# Patient Record
Sex: Male | Born: 1935 | Race: White | Hispanic: No | Smoking: Former smoker
Health system: Southern US, Community
[De-identification: ages and names within clinical notes are randomized; demographics above are authoritative.]

## PROBLEM LIST (undated history)

## (undated) DIAGNOSIS — J449 Chronic obstructive pulmonary disease, unspecified: Secondary | ICD-10-CM

## (undated) DIAGNOSIS — E0590 Thyrotoxicosis, unspecified without thyrotoxic crisis or storm: Secondary | ICD-10-CM

## (undated) DIAGNOSIS — I482 Chronic atrial fibrillation, unspecified: Secondary | ICD-10-CM

## (undated) DIAGNOSIS — J939 Pneumothorax, unspecified: Secondary | ICD-10-CM

## (undated) DIAGNOSIS — Z7901 Long term (current) use of anticoagulants: Secondary | ICD-10-CM

## (undated) DIAGNOSIS — I2510 Atherosclerotic heart disease of native coronary artery without angina pectoris: Secondary | ICD-10-CM

## (undated) DIAGNOSIS — K219 Gastro-esophageal reflux disease without esophagitis: Secondary | ICD-10-CM

## (undated) DIAGNOSIS — C3490 Malignant neoplasm of unspecified part of unspecified bronchus or lung: Secondary | ICD-10-CM

## (undated) DIAGNOSIS — IMO0002 Reserved for concepts with insufficient information to code with codable children: Secondary | ICD-10-CM

## (undated) DIAGNOSIS — I712 Thoracic aortic aneurysm, without rupture: Secondary | ICD-10-CM

## (undated) DIAGNOSIS — Z87891 Personal history of nicotine dependence: Secondary | ICD-10-CM

## (undated) DIAGNOSIS — J9610 Chronic respiratory failure, unspecified whether with hypoxia or hypercapnia: Secondary | ICD-10-CM

## (undated) DIAGNOSIS — H3530 Unspecified macular degeneration: Secondary | ICD-10-CM

## (undated) DIAGNOSIS — Z9981 Dependence on supplemental oxygen: Secondary | ICD-10-CM

## (undated) DIAGNOSIS — N200 Calculus of kidney: Secondary | ICD-10-CM

## (undated) DIAGNOSIS — IMO0001 Reserved for inherently not codable concepts without codable children: Secondary | ICD-10-CM

## (undated) DIAGNOSIS — J700 Acute pulmonary manifestations due to radiation: Secondary | ICD-10-CM

## (undated) HISTORY — DX: Thoracic aortic aneurysm, without rupture: I71.2

## (undated) HISTORY — DX: Reserved for inherently not codable concepts without codable children: IMO0001

## (undated) HISTORY — PX: APPENDECTOMY: SHX54

## (undated) HISTORY — DX: Reserved for concepts with insufficient information to code with codable children: IMO0002

## (undated) HISTORY — DX: Chronic obstructive pulmonary disease, unspecified: J44.9

## (undated) HISTORY — PX: CHOLECYSTECTOMY: SHX55

## (undated) HISTORY — DX: Atherosclerotic heart disease of native coronary artery without angina pectoris: I25.10

## (undated) HISTORY — DX: Chronic atrial fibrillation, unspecified: I48.20

## (undated) HISTORY — DX: Gastro-esophageal reflux disease without esophagitis: K21.9

## (undated) HISTORY — DX: Malignant neoplasm of unspecified part of unspecified bronchus or lung: C34.90

---

## 2001-03-26 ENCOUNTER — Emergency Department (HOSPITAL_COMMUNITY): Admission: EM | Admit: 2001-03-26 | Discharge: 2001-03-26 | Payer: Self-pay | Admitting: Emergency Medicine

## 2003-04-18 ENCOUNTER — Encounter: Admission: RE | Admit: 2003-04-18 | Discharge: 2003-04-18 | Payer: Self-pay | Admitting: Urology

## 2003-04-18 ENCOUNTER — Encounter: Payer: Self-pay | Admitting: Urology

## 2003-04-19 ENCOUNTER — Encounter: Payer: Self-pay | Admitting: Urology

## 2003-04-19 ENCOUNTER — Ambulatory Visit (HOSPITAL_BASED_OUTPATIENT_CLINIC_OR_DEPARTMENT_OTHER): Admission: RE | Admit: 2003-04-19 | Discharge: 2003-04-19 | Payer: Self-pay | Admitting: Urology

## 2003-04-20 ENCOUNTER — Ambulatory Visit (HOSPITAL_BASED_OUTPATIENT_CLINIC_OR_DEPARTMENT_OTHER): Admission: RE | Admit: 2003-04-20 | Discharge: 2003-04-20 | Payer: Self-pay | Admitting: Urology

## 2003-08-17 ENCOUNTER — Ambulatory Visit (HOSPITAL_COMMUNITY): Admission: RE | Admit: 2003-08-17 | Discharge: 2003-08-17 | Payer: Self-pay | Admitting: Urology

## 2004-07-07 ENCOUNTER — Emergency Department (HOSPITAL_COMMUNITY): Admission: EM | Admit: 2004-07-07 | Discharge: 2004-07-08 | Payer: Self-pay | Admitting: Emergency Medicine

## 2004-07-11 ENCOUNTER — Ambulatory Visit (HOSPITAL_COMMUNITY): Admission: RE | Admit: 2004-07-11 | Discharge: 2004-07-11 | Payer: Self-pay | Admitting: Family Medicine

## 2004-07-16 ENCOUNTER — Ambulatory Visit (HOSPITAL_COMMUNITY): Admission: RE | Admit: 2004-07-16 | Discharge: 2004-07-16 | Payer: Self-pay | Admitting: Family Medicine

## 2004-08-05 ENCOUNTER — Ambulatory Visit (HOSPITAL_COMMUNITY): Admission: RE | Admit: 2004-08-05 | Discharge: 2004-08-06 | Payer: Self-pay | Admitting: General Surgery

## 2004-08-05 ENCOUNTER — Encounter (INDEPENDENT_AMBULATORY_CARE_PROVIDER_SITE_OTHER): Payer: Self-pay | Admitting: Specialist

## 2005-12-10 ENCOUNTER — Ambulatory Visit (HOSPITAL_COMMUNITY): Admission: RE | Admit: 2005-12-10 | Discharge: 2005-12-10 | Payer: Self-pay | Admitting: Urology

## 2005-12-12 ENCOUNTER — Ambulatory Visit: Admission: RE | Admit: 2005-12-12 | Discharge: 2006-03-12 | Payer: Self-pay | Admitting: Radiation Oncology

## 2006-03-13 ENCOUNTER — Ambulatory Visit: Admission: RE | Admit: 2006-03-13 | Discharge: 2006-04-21 | Payer: Self-pay | Admitting: Radiation Oncology

## 2007-08-14 ENCOUNTER — Emergency Department (HOSPITAL_COMMUNITY): Admission: EM | Admit: 2007-08-14 | Discharge: 2007-08-15 | Payer: Self-pay | Admitting: Emergency Medicine

## 2007-09-25 ENCOUNTER — Emergency Department (HOSPITAL_COMMUNITY): Admission: EM | Admit: 2007-09-25 | Discharge: 2007-09-25 | Payer: Self-pay | Admitting: Internal Medicine

## 2007-10-05 ENCOUNTER — Emergency Department (HOSPITAL_COMMUNITY): Admission: EM | Admit: 2007-10-05 | Discharge: 2007-10-05 | Payer: Self-pay | Admitting: *Deleted

## 2008-09-03 ENCOUNTER — Inpatient Hospital Stay (HOSPITAL_COMMUNITY): Admission: EM | Admit: 2008-09-03 | Discharge: 2008-09-08 | Payer: Self-pay | Admitting: Emergency Medicine

## 2008-09-04 ENCOUNTER — Encounter (INDEPENDENT_AMBULATORY_CARE_PROVIDER_SITE_OTHER): Payer: Self-pay | Admitting: Internal Medicine

## 2008-10-19 ENCOUNTER — Ambulatory Visit (HOSPITAL_COMMUNITY): Admission: RE | Admit: 2008-10-19 | Discharge: 2008-10-19 | Payer: Self-pay | Admitting: *Deleted

## 2008-12-11 ENCOUNTER — Emergency Department (HOSPITAL_COMMUNITY): Admission: EM | Admit: 2008-12-11 | Discharge: 2008-12-11 | Payer: Self-pay | Admitting: Emergency Medicine

## 2009-02-27 ENCOUNTER — Emergency Department (HOSPITAL_COMMUNITY): Admission: EM | Admit: 2009-02-27 | Discharge: 2009-02-27 | Payer: Self-pay | Admitting: Emergency Medicine

## 2009-03-05 ENCOUNTER — Ambulatory Visit: Payer: Self-pay | Admitting: *Deleted

## 2009-03-05 ENCOUNTER — Inpatient Hospital Stay (HOSPITAL_COMMUNITY): Admission: EM | Admit: 2009-03-05 | Discharge: 2009-03-07 | Payer: Self-pay | Admitting: Emergency Medicine

## 2009-03-09 ENCOUNTER — Emergency Department (HOSPITAL_COMMUNITY): Admission: EM | Admit: 2009-03-09 | Discharge: 2009-03-09 | Payer: Self-pay | Admitting: Family Medicine

## 2009-09-27 ENCOUNTER — Inpatient Hospital Stay (HOSPITAL_COMMUNITY): Admission: EM | Admit: 2009-09-27 | Discharge: 2009-10-01 | Payer: Self-pay | Admitting: Emergency Medicine

## 2010-02-13 ENCOUNTER — Inpatient Hospital Stay (HOSPITAL_COMMUNITY): Admission: EM | Admit: 2010-02-13 | Discharge: 2010-02-16 | Payer: Self-pay | Admitting: Emergency Medicine

## 2010-07-23 ENCOUNTER — Emergency Department (HOSPITAL_COMMUNITY)
Admission: EM | Admit: 2010-07-23 | Discharge: 2010-07-23 | Payer: Self-pay | Source: Home / Self Care | Admitting: Emergency Medicine

## 2010-10-06 HISTORY — PX: ESOPHAGEAL DILATION: SHX303

## 2010-10-16 ENCOUNTER — Ambulatory Visit (HOSPITAL_COMMUNITY)
Admission: RE | Admit: 2010-10-16 | Discharge: 2010-10-16 | Payer: Self-pay | Source: Home / Self Care | Attending: Gastroenterology | Admitting: Gastroenterology

## 2010-12-18 LAB — POCT I-STAT, CHEM 8
BUN: 14 mg/dL (ref 6–23)
Calcium, Ion: 1.2 mmol/L (ref 1.12–1.32)
Chloride: 105 mEq/L (ref 96–112)
Creatinine, Ser: 1.1 mg/dL (ref 0.4–1.5)
Glucose, Bld: 105 mg/dL — ABNORMAL HIGH (ref 70–99)
HCT: 50 % (ref 39.0–52.0)
Hemoglobin: 17 g/dL (ref 13.0–17.0)
Potassium: 3.7 mEq/L (ref 3.5–5.1)
Sodium: 140 mEq/L (ref 135–145)
TCO2: 27 mmol/L (ref 0–100)

## 2010-12-18 LAB — PROTIME-INR
INR: 1.88 — ABNORMAL HIGH (ref 0.00–1.49)
Prothrombin Time: 21.8 seconds — ABNORMAL HIGH (ref 11.6–15.2)

## 2010-12-22 ENCOUNTER — Emergency Department (HOSPITAL_COMMUNITY)
Admission: EM | Admit: 2010-12-22 | Discharge: 2010-12-22 | Disposition: A | Discharge status: Home / Self Care | Payer: Medicare Other | Attending: Emergency Medicine | Admitting: Emergency Medicine

## 2010-12-22 ENCOUNTER — Emergency Department (HOSPITAL_COMMUNITY): Payer: Medicare Other

## 2010-12-22 DIAGNOSIS — I10 Essential (primary) hypertension: Secondary | ICD-10-CM | POA: Insufficient documentation

## 2010-12-22 DIAGNOSIS — R05 Cough: Secondary | ICD-10-CM | POA: Insufficient documentation

## 2010-12-22 DIAGNOSIS — R0602 Shortness of breath: Secondary | ICD-10-CM | POA: Insufficient documentation

## 2010-12-22 DIAGNOSIS — K219 Gastro-esophageal reflux disease without esophagitis: Secondary | ICD-10-CM | POA: Insufficient documentation

## 2010-12-22 DIAGNOSIS — J4489 Other specified chronic obstructive pulmonary disease: Secondary | ICD-10-CM | POA: Insufficient documentation

## 2010-12-22 DIAGNOSIS — J449 Chronic obstructive pulmonary disease, unspecified: Secondary | ICD-10-CM | POA: Insufficient documentation

## 2010-12-22 DIAGNOSIS — R5383 Other fatigue: Secondary | ICD-10-CM | POA: Insufficient documentation

## 2010-12-22 DIAGNOSIS — I509 Heart failure, unspecified: Secondary | ICD-10-CM | POA: Insufficient documentation

## 2010-12-22 DIAGNOSIS — R059 Cough, unspecified: Secondary | ICD-10-CM | POA: Insufficient documentation

## 2010-12-22 DIAGNOSIS — I2510 Atherosclerotic heart disease of native coronary artery without angina pectoris: Secondary | ICD-10-CM | POA: Insufficient documentation

## 2010-12-22 DIAGNOSIS — I4891 Unspecified atrial fibrillation: Secondary | ICD-10-CM | POA: Insufficient documentation

## 2010-12-22 DIAGNOSIS — R5381 Other malaise: Secondary | ICD-10-CM | POA: Insufficient documentation

## 2010-12-22 LAB — CBC
HCT: 42.9 % (ref 39.0–52.0)
Hemoglobin: 14.2 g/dL (ref 13.0–17.0)
MCH: 31.8 pg (ref 26.0–34.0)
MCHC: 33.1 g/dL (ref 30.0–36.0)
MCV: 96.2 fL (ref 78.0–100.0)
Platelets: 111 10*3/uL — ABNORMAL LOW (ref 150–400)
RBC: 4.46 MIL/uL (ref 4.22–5.81)
RDW: 13.2 % (ref 11.5–15.5)
WBC: 15.1 10*3/uL — ABNORMAL HIGH (ref 4.0–10.5)

## 2010-12-22 LAB — DIFFERENTIAL
Basophils Absolute: 0 10*3/uL (ref 0.0–0.1)
Basophils Relative: 0 % (ref 0–1)
Eosinophils Absolute: 0 10*3/uL (ref 0.0–0.7)
Eosinophils Relative: 0 % (ref 0–5)
Lymphocytes Relative: 6 % — ABNORMAL LOW (ref 12–46)
Lymphs Abs: 0.9 10*3/uL (ref 0.7–4.0)
Monocytes Absolute: 1.1 10*3/uL — ABNORMAL HIGH (ref 0.1–1.0)
Monocytes Relative: 7 % (ref 3–12)
Neutro Abs: 13.1 10*3/uL — ABNORMAL HIGH (ref 1.7–7.7)
Neutrophils Relative %: 87 % — ABNORMAL HIGH (ref 43–77)

## 2010-12-22 LAB — COMPREHENSIVE METABOLIC PANEL
ALT: 39 U/L (ref 0–53)
AST: 38 U/L — ABNORMAL HIGH (ref 0–37)
Albumin: 3.6 g/dL (ref 3.5–5.2)
Alkaline Phosphatase: 72 U/L (ref 39–117)
BUN: 13 mg/dL (ref 6–23)
CO2: 27 mEq/L (ref 19–32)
Calcium: 9 mg/dL (ref 8.4–10.5)
Chloride: 109 mEq/L (ref 96–112)
Creatinine, Ser: 1.2 mg/dL (ref 0.4–1.5)
GFR calc Af Amer: >60 mL/min (ref >60)
GFR calc non Af Amer: 59 mL/min — ABNORMAL LOW (ref >60)
Glucose, Bld: 104 mg/dL — ABNORMAL HIGH (ref 70–99)
Potassium: 4.3 mEq/L (ref 3.5–5.1)
Sodium: 141 mEq/L (ref 135–145)
Total Bilirubin: 1 mg/dL (ref 0.3–1.2)
Total Protein: 7.4 g/dL (ref 6.0–8.3)

## 2010-12-22 LAB — BRAIN NATRIURETIC PEPTIDE: Pro B Natriuretic peptide (BNP): 153 pg/mL — ABNORMAL HIGH (ref 0.0–100.0)

## 2010-12-23 ENCOUNTER — Ambulatory Visit
Admission: RE | Admit: 2010-12-23 | Discharge: 2010-12-23 | Disposition: A | Discharge status: Home / Self Care | Payer: Medicare Other | Source: Ambulatory Visit | Attending: Cardiology | Admitting: Cardiology

## 2010-12-23 ENCOUNTER — Other Ambulatory Visit: Payer: Self-pay | Admitting: Cardiology

## 2010-12-23 DIAGNOSIS — R0989 Other specified symptoms and signs involving the circulatory and respiratory systems: Secondary | ICD-10-CM

## 2010-12-23 DIAGNOSIS — R0609 Other forms of dyspnea: Secondary | ICD-10-CM

## 2010-12-24 LAB — CULTURE, BLOOD (ROUTINE X 2)

## 2010-12-24 LAB — DIFFERENTIAL
Basophils Absolute: 0 10*3/uL (ref 0.0–0.1)
Basophils Absolute: 0 10*3/uL (ref 0.0–0.1)
Basophils Relative: 0 % (ref 0–1)
Basophils Relative: 0 % (ref 0–1)
Eosinophils Absolute: 0 10*3/uL (ref 0.0–0.7)
Eosinophils Relative: 0 % (ref 0–5)
Lymphocytes Relative: 4 % — ABNORMAL LOW (ref 12–46)
Lymphocytes Relative: 5 % — ABNORMAL LOW (ref 12–46)
Lymphs Abs: 0.7 10*3/uL (ref 0.7–4.0)
Monocytes Absolute: 0.3 10*3/uL (ref 0.1–1.0)
Monocytes Absolute: 0.7 10*3/uL (ref 0.1–1.0)
Monocytes Relative: 3 % (ref 3–12)
Neutro Abs: 10.3 10*3/uL — ABNORMAL HIGH (ref 1.7–7.7)
Neutro Abs: 12.1 10*3/uL — ABNORMAL HIGH (ref 1.7–7.7)
Neutro Abs: 12.8 10*3/uL — ABNORMAL HIGH (ref 1.7–7.7)
Neutrophils Relative %: 93 % — ABNORMAL HIGH (ref 43–77)
WBC Morphology: INCREASED

## 2010-12-24 LAB — URINALYSIS, ROUTINE W REFLEX MICROSCOPIC
Ketones, ur: NEGATIVE mg/dL
Leukocytes, UA: NEGATIVE
Nitrite: NEGATIVE
pH: 6.5 (ref 5.0–8.0)

## 2010-12-24 LAB — PROTIME-INR
INR: 1.63 — ABNORMAL HIGH (ref 0.00–1.49)
INR: 1.94 — ABNORMAL HIGH (ref 0.00–1.49)
INR: 2.25 — ABNORMAL HIGH (ref 0.00–1.49)
INR: 2.29 — ABNORMAL HIGH (ref 0.00–1.49)
Prothrombin Time: 25 seconds — ABNORMAL HIGH (ref 11.6–15.2)

## 2010-12-24 LAB — LEGIONELLA ANTIGEN, URINE: Legionella Antigen, Urine: NEGATIVE

## 2010-12-24 LAB — BASIC METABOLIC PANEL
BUN: 12 mg/dL (ref 6–23)
BUN: 27 mg/dL — ABNORMAL HIGH (ref 6–23)
CO2: 30 mEq/L (ref 19–32)
Calcium: 9.1 mg/dL (ref 8.4–10.5)
Chloride: 97 mEq/L (ref 96–112)
Creatinine, Ser: 1.12 mg/dL (ref 0.4–1.5)
Creatinine, Ser: 1.31 mg/dL (ref 0.4–1.5)
Creatinine, Ser: 1.34 mg/dL (ref 0.4–1.5)
GFR calc Af Amer: >60 mL/min (ref >60)
GFR calc Af Amer: >60 mL/min (ref >60)
GFR calc non Af Amer: 53 mL/min — ABNORMAL LOW (ref >60)
GFR calc non Af Amer: >60 mL/min (ref >60)
Glucose, Bld: 108 mg/dL — ABNORMAL HIGH (ref 70–99)
Potassium: 3.3 mEq/L — ABNORMAL LOW (ref 3.5–5.1)

## 2010-12-24 LAB — LIPID PANEL
Cholesterol: 161 mg/dL (ref 0–200)
Total CHOL/HDL Ratio: 1.7 RATIO
VLDL: 10 mg/dL (ref 0–40)

## 2010-12-24 LAB — BRAIN NATRIURETIC PEPTIDE
Pro B Natriuretic peptide (BNP): 278 pg/mL — ABNORMAL HIGH (ref 0.0–100.0)
Pro B Natriuretic peptide (BNP): 282 pg/mL — ABNORMAL HIGH (ref 0.0–100.0)

## 2010-12-24 LAB — CBC
HCT: 45.2 % (ref 39.0–52.0)
HCT: 46.4 % (ref 39.0–52.0)
Hemoglobin: 15.3 g/dL (ref 13.0–17.0)
MCHC: 33.8 g/dL (ref 30.0–36.0)
MCHC: 34.3 g/dL (ref 30.0–36.0)
MCV: 97.9 fL (ref 78.0–100.0)
MCV: 99.2 fL (ref 78.0–100.0)
Platelets: 102 10*3/uL — ABNORMAL LOW (ref 150–400)
Platelets: 92 10*3/uL — ABNORMAL LOW (ref 150–400)
Platelets: 96 10*3/uL — ABNORMAL LOW (ref 150–400)
RBC: 4.23 MIL/uL (ref 4.22–5.81)
RBC: 4.56 MIL/uL (ref 4.22–5.81)
RBC: 4.6 MIL/uL (ref 4.22–5.81)
RDW: 14 % (ref 11.5–15.5)
WBC: 11.1 10*3/uL — ABNORMAL HIGH (ref 4.0–10.5)
WBC: 12.8 10*3/uL — ABNORMAL HIGH (ref 4.0–10.5)
WBC: 12.9 10*3/uL — ABNORMAL HIGH (ref 4.0–10.5)

## 2010-12-24 LAB — HEMOGLOBIN A1C: Hgb A1c MFr Bld: 5.3 % (ref <5.7)

## 2010-12-24 LAB — URINE CULTURE

## 2010-12-24 LAB — STREP PNEUMONIAE URINARY ANTIGEN: Strep Pneumo Urinary Antigen: NEGATIVE

## 2010-12-24 LAB — LACTATE DEHYDROGENASE: LDH: 192 U/L (ref 94–250)

## 2010-12-24 LAB — TSH: TSH: 0.318 u[IU]/mL — ABNORMAL LOW (ref 0.350–4.500)

## 2010-12-24 LAB — CARDIAC PANEL(CRET KIN+CKTOT+MB+TROPI)
Relative Index: INVALID (ref 0.0–2.5)
Total CK: 96 U/L (ref 7–232)

## 2010-12-24 LAB — CK TOTAL AND CKMB (NOT AT ARMC): Relative Index: 2 (ref 0.0–2.5)

## 2010-12-24 LAB — COMPREHENSIVE METABOLIC PANEL
Albumin: 3.1 g/dL — ABNORMAL LOW (ref 3.5–5.2)
Alkaline Phosphatase: 68 U/L (ref 39–117)
BUN: 15 mg/dL (ref 6–23)
CO2: 30 mEq/L (ref 19–32)
Chloride: 100 mEq/L (ref 96–112)
Creatinine, Ser: 1.15 mg/dL (ref 0.4–1.5)
GFR calc non Af Amer: >60 mL/min (ref >60)
Potassium: 3.5 mEq/L (ref 3.5–5.1)
Total Bilirubin: 0.8 mg/dL (ref 0.3–1.2)

## 2010-12-24 LAB — APTT: aPTT: 32 seconds (ref 24–37)

## 2010-12-24 LAB — URINE MICROSCOPIC-ADD ON

## 2010-12-24 LAB — TROPONIN I: Troponin I: 0.02 ng/mL (ref 0.00–0.06)

## 2010-12-24 LAB — DIGOXIN LEVEL: Digoxin Level: 0.5 ng/mL — ABNORMAL LOW (ref 0.8–2.0)

## 2010-12-24 LAB — TECHNOLOGIST SMEAR REVIEW

## 2011-01-06 LAB — BASIC METABOLIC PANEL
BUN: 34 mg/dL — ABNORMAL HIGH (ref 6–23)
BUN: 34 mg/dL — ABNORMAL HIGH (ref 6–23)
CO2: 29 mEq/L (ref 19–32)
CO2: 32 mEq/L (ref 19–32)
Calcium: 8.6 mg/dL (ref 8.4–10.5)
Calcium: 9.1 mg/dL (ref 8.4–10.5)
Calcium: 9.4 mg/dL (ref 8.4–10.5)
Chloride: 98 mEq/L (ref 96–112)
Chloride: 99 mEq/L (ref 96–112)
Creatinine, Ser: 0.97 mg/dL (ref 0.4–1.5)
Creatinine, Ser: 1.46 mg/dL (ref 0.4–1.5)
GFR calc Af Amer: 57 mL/min — ABNORMAL LOW (ref >60)
GFR calc Af Amer: >60 mL/min (ref >60)
GFR calc Af Amer: >60 mL/min (ref >60)
GFR calc non Af Amer: 46 mL/min — ABNORMAL LOW (ref >60)
GFR calc non Af Amer: >60 mL/min (ref >60)
GFR calc non Af Amer: >60 mL/min (ref >60)
Glucose, Bld: 133 mg/dL — ABNORMAL HIGH (ref 70–99)
Glucose, Bld: 143 mg/dL — ABNORMAL HIGH (ref 70–99)
Potassium: 4.2 mEq/L (ref 3.5–5.1)
Sodium: 136 mEq/L (ref 135–145)
Sodium: 139 mEq/L (ref 135–145)

## 2011-01-06 LAB — GLUCOSE, CAPILLARY
Glucose-Capillary: 110 mg/dL — ABNORMAL HIGH (ref 70–99)
Glucose-Capillary: 115 mg/dL — ABNORMAL HIGH (ref 70–99)
Glucose-Capillary: 118 mg/dL — ABNORMAL HIGH (ref 70–99)
Glucose-Capillary: 121 mg/dL — ABNORMAL HIGH (ref 70–99)
Glucose-Capillary: 122 mg/dL — ABNORMAL HIGH (ref 70–99)
Glucose-Capillary: 126 mg/dL — ABNORMAL HIGH (ref 70–99)
Glucose-Capillary: 126 mg/dL — ABNORMAL HIGH (ref 70–99)
Glucose-Capillary: 129 mg/dL — ABNORMAL HIGH (ref 70–99)
Glucose-Capillary: 136 mg/dL — ABNORMAL HIGH (ref 70–99)
Glucose-Capillary: 139 mg/dL — ABNORMAL HIGH (ref 70–99)
Glucose-Capillary: 139 mg/dL — ABNORMAL HIGH (ref 70–99)
Glucose-Capillary: 147 mg/dL — ABNORMAL HIGH (ref 70–99)
Glucose-Capillary: 151 mg/dL — ABNORMAL HIGH (ref 70–99)
Glucose-Capillary: 176 mg/dL — ABNORMAL HIGH (ref 70–99)

## 2011-01-06 LAB — CBC
HCT: 44.3 % (ref 39.0–52.0)
HCT: 46 % (ref 39.0–52.0)
Hemoglobin: 14.8 g/dL (ref 13.0–17.0)
Hemoglobin: 15.4 g/dL (ref 13.0–17.0)
MCHC: 33.2 g/dL (ref 30.0–36.0)
MCHC: 33.5 g/dL (ref 30.0–36.0)
MCV: 97.1 fL (ref 78.0–100.0)
MCV: 97.8 fL (ref 78.0–100.0)
Platelets: 86 10*3/uL — ABNORMAL LOW (ref 150–400)
RBC: 4.53 MIL/uL (ref 4.22–5.81)
RBC: 4.63 MIL/uL (ref 4.22–5.81)
RBC: 4.74 MIL/uL (ref 4.22–5.81)
RBC: 5.43 MIL/uL (ref 4.22–5.81)
RDW: 14.7 % (ref 11.5–15.5)
RDW: 14.8 % (ref 11.5–15.5)
WBC: 10.1 10*3/uL (ref 4.0–10.5)
WBC: 21.3 10*3/uL — ABNORMAL HIGH (ref 4.0–10.5)

## 2011-01-06 LAB — CULTURE, BLOOD (ROUTINE X 2)
Culture: NO GROWTH
Culture: NO GROWTH

## 2011-01-06 LAB — EXPECTORATED SPUTUM ASSESSMENT W GRAM STAIN, RFLX TO RESP C

## 2011-01-06 LAB — TSH: TSH: 0.302 u[IU]/mL — ABNORMAL LOW (ref 0.350–4.500)

## 2011-01-06 LAB — CARDIAC PANEL(CRET KIN+CKTOT+MB+TROPI)
CK, MB: 5 ng/mL — ABNORMAL HIGH (ref 0.3–4.0)
CK, MB: 7.1 ng/mL — ABNORMAL HIGH (ref 0.3–4.0)
Relative Index: 6.9 — ABNORMAL HIGH (ref 0.0–2.5)
Total CK: 73 U/L (ref 7–232)

## 2011-01-06 LAB — PROTIME-INR
INR: 1.98 — ABNORMAL HIGH (ref 0.00–1.49)
INR: 2.03 — ABNORMAL HIGH (ref 0.00–1.49)
Prothrombin Time: 22.3 seconds — ABNORMAL HIGH (ref 11.6–15.2)
Prothrombin Time: 22.8 seconds — ABNORMAL HIGH (ref 11.6–15.2)

## 2011-01-06 LAB — POCT CARDIAC MARKERS
CKMB, poc: 1.3 ng/mL (ref 1.0–8.0)
Myoglobin, poc: 146 ng/mL (ref 12–200)
Troponin i, poc: <0.05 ng/mL (ref 0.00–0.09)

## 2011-01-06 LAB — DIFFERENTIAL
Lymphs Abs: 0.5 10*3/uL — ABNORMAL LOW (ref 0.7–4.0)
Monocytes Relative: 5 % (ref 3–12)
Neutro Abs: 19.6 10*3/uL — ABNORMAL HIGH (ref 1.7–7.7)
Neutrophils Relative %: 92 % — ABNORMAL HIGH (ref 43–77)

## 2011-01-06 LAB — CK TOTAL AND CKMB (NOT AT ARMC)
CK, MB: 5.9 ng/mL — ABNORMAL HIGH (ref 0.3–4.0)
Relative Index: INVALID (ref 0.0–2.5)
Total CK: 74 U/L (ref 7–232)

## 2011-01-06 LAB — LEGIONELLA ANTIGEN, URINE: Legionella Antigen, Urine: NEGATIVE

## 2011-01-06 LAB — LACTIC ACID, PLASMA: Lactic Acid, Venous: 3.4 mmol/L — ABNORMAL HIGH (ref 0.5–2.2)

## 2011-01-06 LAB — HEMOGLOBIN A1C: Mean Plasma Glucose: 120 mg/dL

## 2011-01-06 LAB — DIGOXIN LEVEL: Digoxin Level: 1 ng/mL (ref 0.8–2.0)

## 2011-01-06 LAB — TROPONIN I: Troponin I: 0.02 ng/mL (ref 0.00–0.06)

## 2011-01-13 LAB — PROTIME-INR
INR: 2.1 — ABNORMAL HIGH (ref 0.00–1.49)
INR: 2.9 — ABNORMAL HIGH (ref 0.00–1.49)
Prothrombin Time: 25 seconds — ABNORMAL HIGH (ref 11.6–15.2)

## 2011-01-13 LAB — CBC
Hemoglobin: 14.8 g/dL (ref 13.0–17.0)
Hemoglobin: 14.9 g/dL (ref 13.0–17.0)
MCHC: 32.8 g/dL (ref 30.0–36.0)
MCV: 97.5 fL (ref 78.0–100.0)
RBC: 4.57 MIL/uL (ref 4.22–5.81)
RBC: 4.65 MIL/uL (ref 4.22–5.81)
RDW: 14.2 % (ref 11.5–15.5)
RDW: 14.2 % (ref 11.5–15.5)
WBC: 4.7 10*3/uL (ref 4.0–10.5)

## 2011-01-13 LAB — LIPID PANEL
LDL Cholesterol: 84 mg/dL (ref 0–99)
Triglycerides: 83 mg/dL (ref <150)

## 2011-01-13 LAB — BASIC METABOLIC PANEL
BUN: 10 mg/dL (ref 6–23)
CO2: 32 mEq/L (ref 19–32)
CO2: 32 mEq/L (ref 19–32)
Glucose, Bld: 101 mg/dL — ABNORMAL HIGH (ref 70–99)
Glucose, Bld: 98 mg/dL (ref 70–99)
Potassium: 3.3 mEq/L — ABNORMAL LOW (ref 3.5–5.1)
Potassium: 3.9 mEq/L (ref 3.5–5.1)
Sodium: 141 mEq/L (ref 135–145)
Sodium: 143 mEq/L (ref 135–145)

## 2011-01-13 LAB — CARDIAC PANEL(CRET KIN+CKTOT+MB+TROPI)
CK, MB: 3.1 ng/mL (ref 0.3–4.0)
CK, MB: 4.2 ng/mL — ABNORMAL HIGH (ref 0.3–4.0)
Total CK: 141 U/L (ref 7–232)
Total CK: 95 U/L (ref 7–232)

## 2011-01-13 LAB — APTT: aPTT: 43 seconds — ABNORMAL HIGH (ref 24–37)

## 2011-01-14 LAB — PROTIME-INR: Prothrombin Time: 28 seconds — ABNORMAL HIGH (ref 11.6–15.2)

## 2011-01-14 LAB — CBC
HCT: 47.4 % (ref 39.0–52.0)
HCT: 49.6 % (ref 39.0–52.0)
Hemoglobin: 16.7 g/dL (ref 13.0–17.0)
MCHC: 33.2 g/dL (ref 30.0–36.0)
MCHC: 33.8 g/dL (ref 30.0–36.0)
MCV: 96.3 fL (ref 78.0–100.0)
Platelets: 127 10*3/uL — ABNORMAL LOW (ref 150–400)
Platelets: 132 10*3/uL — ABNORMAL LOW (ref 150–400)
RDW: 14 % (ref 11.5–15.5)
RDW: 14.2 % (ref 11.5–15.5)

## 2011-01-14 LAB — POCT CARDIAC MARKERS: Troponin i, poc: <0.05 ng/mL (ref 0.00–0.09)

## 2011-01-14 LAB — POCT I-STAT, CHEM 8
Calcium, Ion: 1.15 mmol/L (ref 1.12–1.32)
Chloride: 102 mEq/L (ref 96–112)
Glucose, Bld: 88 mg/dL (ref 70–99)
HCT: 52 % (ref 39.0–52.0)
TCO2: 26 mmol/L (ref 0–100)

## 2011-01-14 LAB — DIFFERENTIAL
Basophils Absolute: 0 10*3/uL (ref 0.0–0.1)
Basophils Relative: 1 % (ref 0–1)
Eosinophils Absolute: 0.1 10*3/uL (ref 0.0–0.7)
Eosinophils Relative: 2 % (ref 0–5)
Neutrophils Relative %: 66 % (ref 43–77)

## 2011-01-14 LAB — CK TOTAL AND CKMB (NOT AT ARMC): CK, MB: 3.2 ng/mL (ref 0.3–4.0)

## 2011-01-16 LAB — BASIC METABOLIC PANEL
CO2: 25 mEq/L (ref 19–32)
Chloride: 103 mEq/L (ref 96–112)
Creatinine, Ser: 1.1 mg/dL (ref 0.4–1.5)
GFR calc Af Amer: >60 mL/min (ref >60)
Potassium: 3.7 mEq/L (ref 3.5–5.1)

## 2011-01-16 LAB — CBC
HCT: 46.3 % (ref 39.0–52.0)
MCHC: 34 g/dL (ref 30.0–36.0)
MCV: 101.9 fL — ABNORMAL HIGH (ref 78.0–100.0)
RBC: 4.55 MIL/uL (ref 4.22–5.81)

## 2011-01-16 LAB — DIFFERENTIAL
Basophils Relative: 0 % (ref 0–1)
Eosinophils Absolute: 0 10*3/uL (ref 0.0–0.7)
Eosinophils Relative: 0 % (ref 0–5)
Lymphs Abs: 0.6 10*3/uL — ABNORMAL LOW (ref 0.7–4.0)
Monocytes Absolute: 0.8 10*3/uL (ref 0.1–1.0)
Neutro Abs: 10.1 10*3/uL — ABNORMAL HIGH (ref 1.7–7.7)
Neutrophils Relative %: 88 % — ABNORMAL HIGH (ref 43–77)

## 2011-01-16 LAB — PROTIME-INR: Prothrombin Time: 24.6 seconds — ABNORMAL HIGH (ref 11.6–15.2)

## 2011-01-21 ENCOUNTER — Emergency Department (HOSPITAL_COMMUNITY): Payer: Medicare Other

## 2011-01-21 ENCOUNTER — Inpatient Hospital Stay (HOSPITAL_COMMUNITY)
Admission: EM | Admit: 2011-01-21 | Discharge: 2011-01-23 | DRG: 190 | Disposition: A | Discharge status: Home / Self Care | Payer: Medicare Other | Attending: Internal Medicine | Admitting: Internal Medicine

## 2011-01-21 DIAGNOSIS — I4891 Unspecified atrial fibrillation: Secondary | ICD-10-CM | POA: Diagnosis present

## 2011-01-21 DIAGNOSIS — J441 Chronic obstructive pulmonary disease with (acute) exacerbation: Principal | ICD-10-CM | POA: Diagnosis present

## 2011-01-21 DIAGNOSIS — J189 Pneumonia, unspecified organism: Secondary | ICD-10-CM | POA: Diagnosis present

## 2011-01-21 DIAGNOSIS — I10 Essential (primary) hypertension: Secondary | ICD-10-CM | POA: Diagnosis present

## 2011-01-21 DIAGNOSIS — D696 Thrombocytopenia, unspecified: Secondary | ICD-10-CM | POA: Diagnosis present

## 2011-01-21 DIAGNOSIS — Z7901 Long term (current) use of anticoagulants: Secondary | ICD-10-CM

## 2011-01-21 LAB — BASIC METABOLIC PANEL
CO2: 24 mEq/L (ref 19–32)
Calcium: 9 mg/dL (ref 8.4–10.5)
GFR calc Af Amer: >60 mL/min (ref >60)
Glucose, Bld: 87 mg/dL (ref 70–99)
Potassium: 4.2 mEq/L (ref 3.5–5.1)
Sodium: 137 mEq/L (ref 135–145)

## 2011-01-21 LAB — DIFFERENTIAL
Eosinophils Relative: 1 % (ref 0–5)
Lymphocytes Relative: 7 % — ABNORMAL LOW (ref 12–46)
Lymphs Abs: 1.4 10*3/uL (ref 0.7–4.0)
Monocytes Absolute: 1.2 10*3/uL — ABNORMAL HIGH (ref 0.1–1.0)
Monocytes Relative: 7 % (ref 3–12)
Neutro Abs: 15.7 10*3/uL — ABNORMAL HIGH (ref 1.7–7.7)

## 2011-01-21 LAB — CBC
HCT: 44 % (ref 39.0–52.0)
Hemoglobin: 14.8 g/dL (ref 13.0–17.0)
MCH: 31.8 pg (ref 26.0–34.0)
MCHC: 33.6 g/dL (ref 30.0–36.0)
MCV: 94.6 fL (ref 78.0–100.0)
RDW: 13.4 % (ref 11.5–15.5)

## 2011-01-21 LAB — PROTIME-INR
INR: 2.25 — ABNORMAL HIGH (ref 0.00–1.49)
Prothrombin Time: 25 seconds — ABNORMAL HIGH (ref 11.6–15.2)

## 2011-01-21 LAB — POCT CARDIAC MARKERS: Myoglobin, poc: 102 ng/mL (ref 12–200)

## 2011-01-21 LAB — APTT: aPTT: 41 seconds — ABNORMAL HIGH (ref 24–37)

## 2011-01-22 LAB — DIFFERENTIAL
Basophils Absolute: 0 10*3/uL (ref 0.0–0.1)
Basophils Relative: 0 % (ref 0–1)
Eosinophils Relative: 0 % (ref 0–5)
Lymphocytes Relative: 4 % — ABNORMAL LOW (ref 12–46)
Monocytes Absolute: 0.4 10*3/uL (ref 0.1–1.0)
Neutro Abs: 13.2 10*3/uL — ABNORMAL HIGH (ref 1.7–7.7)

## 2011-01-22 LAB — PROTIME-INR
INR: 2.13 — ABNORMAL HIGH (ref 0.00–1.49)
Prothrombin Time: 24 seconds — ABNORMAL HIGH (ref 11.6–15.2)

## 2011-01-22 LAB — CBC
MCHC: 33.8 g/dL (ref 30.0–36.0)
RDW: 13.3 % (ref 11.5–15.5)
WBC: 14.2 10*3/uL — ABNORMAL HIGH (ref 4.0–10.5)

## 2011-01-23 LAB — PROTIME-INR: Prothrombin Time: 26.8 seconds — ABNORMAL HIGH (ref 11.6–15.2)

## 2011-01-23 NOTE — H&P (Addendum)
NAME:  IVAL, PACER NO.:  [id number]  MEDICAL RECORD NO.:  [id number]           PATIENT TYPE:  E  LOCATION:  [facility]                         FACILITY:  [facility]  PHYSICIAN:  Lonia Blood, M.D.       DATE OF BIRTH:  05/20/1933  DATE OF ADMISSION:  01/21/2011 DATE OF DISCHARGE:                             HISTORY & PHYSICAL   PRIMARY CARE PROVIDER:  Marjory Lies, MD  CARDIOLOGIST:  Georga Hacking, MD  The patient is being admitted to [facility], [facility] Team #2.  CHIEF COMPLAINT:  Dyspnea.  HISTORY OF PRESENT ILLNESS:  [name] is a very pleasant 77 year old male with a history of COPD, atrial fibrillation on chronic anticoagulation, and hypertension, who presents to the [facility] Emergency Room with a 4-day history of shortness of breath.  Information is obtained from the patient.  He indicates that about 4 days ago, he developed gradual shortness of breath on exertion only.  He indicates that over the last 4 days, this shortness of breath has worsened to the point that he is short of breath at rest.  He ordinarily experiences no shortness of breath with activity and takes a nightly walk of over a mile without experiencing shortness of breath.  Over the last 4 days, he said he used his albuterol inhaler which he has not had to do in several weeks and it only helped a little bit.  He does indicate that he slept without difficulty.  He is able to lie flat, but he is on his side. Yesterday, he developed a productive cough.  Sputum is thick and grayish.  He denies headache, visual disturbances, chest pain, numbness, tingling of extremities, fever, chills, nausea, vomiting, or weakness. He indicates that it became obvious to him this morning that he was not getting better on his own, so he called and made an appointment for his PCP.  As the day wore on, he found it more and more difficult to breathe, so he came to the emergency room at about noon  today.  Symptoms came on gradually, have worsened, are characterized as moderate to severe.  Activity makes it worse.  Rest makes it better.  In the emergency room, he received 125 mg of Solu-Medrol and breathing treatments.  Chest x-ray concerning for pneumonia.  We were asked to admit for further evaluation and treatment.  ALLERGIES:  CODEINE.  PAST MEDICAL HISTORY: 1. COPD. 2. Atrial fibrillation on chronic anticoagulation. 3. Hypertension. 4. Renal calculi. 5. Thrombocytopenia.  PAST SURGICAL HISTORY: 1. Remote appendectomy. 2. Cholecystectomy.  FAMILY MEDICAL HISTORY:  Positive for hypertension on his father's side.  SOCIAL HISTORY:  The patient is married and has been married for 52 years.  He lives with his wife.  He is a retired Surveyor, minerals.  He is a former smoker.  He quit in December 2010.  He denies EtOH use.  Denies any drugs.  MEDICATIONS:  Being reconciled by pharmacy but include: 1. Coumadin. 2. Cardizem 3. Lasix. 4. Spiriva. 5. Ambien.  REVIEW OF SYSTEMS:  GENERAL:  Denies fever, chills, anorexia, unintentional weight loss.  ENT: Denies ear  pain, nasal congestion, sore throat.  CV: Denies chest pain, palpitation, lower extremity edema. RESPIRATORY:  See HPI.  MUSCULOSKELETAL:  Denies any joint pain, muscle weakness.  NEURO:  Denies headache, visual disturbances, numbness, tingling of extremities, photophobia.  GI: Denies abdominal pain, nausea, vomiting, constipation, diarrhea, melena.  GU:  Denies any dysuria, hematuria, frequency or urgency.  PSYCH:  Denies any depression, anxiety.  HEME:  Denies any unusual bruising or bleeding.  LABORATORY DATA:  WBC is 18.5, hemoglobin 14.8, hematocrit 44.0, platelets 118, neutrophils 85%, absolute neutrophils 15.7. Sodium 137, potassium 4.2, chloride 104, CO2 of 24, BUN 14, creatinine 1.10 and glucose 87.  BNP 159, CK-MB 1.9, troponin I less than 0.05, myoglobin 102.  RADIOLOGY:  Chest x-ray yields left lower  lobe airspace diseases suspicious for infection or aspiration.  Asymmetric pulmonary edema a possibility.  Underlying COPD.  PHYSICAL EXAMINATION:  VITAL SIGNS:  T 98.4, BP 138/84, heart rate 96, respiration 16, sats 90% on 2 L. GENERAL:  Awake, alert, sitting up in bed, well-nourished, well- hydrated, no acute distress. HEENT:  Head normocephalic, atraumatic.  Pupils equal, round, reactive to light.  EOMI.  Mucous membranes of his mouth are slightly dry but pink.  No obvious lesion or exudate in his nose or ears. NECK:  Supple.  No JVD.  Full range of motion.  No lymphadenopathy. CV:  Irregularly irregular.  No murmur, gallop.  No lower extremity edema. RESPIRATORY:  Mild increased work of breathing.  Breath sounds with crackles in bilateral bases.  Rhonchi on the right.  Breath sounds diminished on the left. NEURO:  Alert and oriented x3.  Speech clear.  Facial symmetry.  Cranial nerves II-XII grossly intact. MUSCULOSKELETAL:  Moves all extremities.  No joint pain, swelling, or erythema. EXTREMITIES:  Without clubbing or cyanosis.  ASSESSMENT AND PLAN: 1. Dyspnea secondary to community-acquired pneumonia in the setting of     chronic obstructive pulmonary disease exacerbation.  We will admit     to regular floor.  We will provide O2 support.  Monitor sats.  We     will get a sputum culture.  We will start Rocephin and Avelox.  The     patient is currently afebrile.  Vital signs are stable.  White     count 18.5.  We will recheck in the a.m.. 2. Chronic obstructive pulmonary disease exacerbation.  We will give     Solu-Medrol, DuoNeb and O2 support.  We will get a sputum culture     and we will monitor sats closely. 3. History of atrial fibrillation on Coumadin, rate controlled.  We     will request pharmacy to dose Coumadin.  Continue Cardizem. 4. History of hypertension.  Blood pressure is currently 138/84.  We     will continue his home meds. 5. Chronic thrombocytopenia.   Currently 118.  Chart review indicates     that this is his baseline. 6. Deep vein thrombosis prophylaxis.  The patient is on Coumadin. 7. Code status.  The patient is a full code.  This assessment and plan was discussed with Dr. Lavera Guise.     Gwenyth Bender, NP   ______________________________ Lonia Blood, M.D.    KMB/MEDQ  D:  01/21/2011  T:  01/21/2011  Job:  [id number]  cc:   Marjory Lies, M.D. Georga Hacking, M.D.  Electronically Signed by Lonia Blood M.D. on 01/23/2011 02:24:13 PM Electronically Signed by Toya Smothers  on 01/30/2011 03:09:07 PM

## 2011-02-12 NOTE — Discharge Summary (Signed)
  NAMENICHLOS, KUNZLER NO.:  [id number]  MEDICAL RECORD NO.:  [id number]           PATIENT TYPE:  I  LOCATION:  5511                         FACILITY:  [facility]  PHYSICIAN:  Calvert Cantor, M.D.     DATE OF BIRTH:  08/27/1933  DATE OF ADMISSION:  01/21/2011 DATE OF DISCHARGE:  01/23/2011                              DISCHARGE SUMMARY   PRIMARY CARE PHYSICIAN:  Marjory Lies, MD  PRESENTING COMPLAINT:  Shortness of breath.  DISCHARGE DIAGNOSES: 1. Community-acquired pneumonia. 2. Chronic obstructive pulmonary disease exacerbation. 3. Past medical history atrial fibrillation on Coumadin. 4. Hypertension. 5. Chronic thrombocytopenia.  DISCHARGE MEDICATIONS:  New medications are, 1. Avelox 400 mg daily for 5 more days to complete a 7-day course. 2. Prednisone 40 mg daily for 5 more days.  Continue the following home medications, 1. Symbicort 1 puff b.i.d. 2. Lanoxin 0.125 mg at bedtime. 3. Cardizem 180 mg b.i.d. 4. Lasix 40 mg daily as needed. 5. Coumadin 4 mg 1 tablet daily except on Wednesdays he should take 1-     1/2 tabs. 6. Ambien 10 mg daily. 7. Proventil inhaler 1-2 puffs q.4 h. as needed.  HOSPITAL COURSE:  This is a 77 year old male who came to the ER with a complaint of shortness of breath.  Chest x-ray performed in the ER revealed left lower lobe airspace disease.  This is suspicious for infection or pneumonia and underlying COPD.  The patient was admitted for community-acquired pneumonia.  He was started on Rocephin and Avelox.  He improved quite quickly and is ready for discharge today.  He is not requiring oxygen at rest or after exertion.  He states that his cough has improved and he is not coughing up much mucus any more.  PHYSICAL EXAMINATION:  LUNGS:  Clear bilaterally with normal respiratory effort. HEART:  Regular rate and rhythm.  No murmurs. ABDOMEN:  Soft, nontender, nondistended.  Bowel sounds positive.   No organomegaly. EXTREMITIES:  No cyanosis, clubbing, or edema.  PERTINENT BLOOD WORK:  WBC count on arrival was 18.5, when rechecked the following day it was 14.2.  INR was 4.28 today.  CONDITION ON DISCHARGE:  Stable.  TIME ON DISCHARGE:  45 minutes.     Calvert Cantor, M.D.     SR/MEDQ  D:  01/23/2011  T:  01/23/2011  Job:  [id number]  cc:   Marjory Lies, M.D.  Electronically Signed by Calvert Cantor M.D. on 02/12/2011 10:59:08 PM

## 2011-02-18 NOTE — H&P (Signed)
NAME:  [name], ESSNER NO.:  [id number]   MEDICAL RECORD NO.:  [id number]          PATIENT TYPE:  EMS   LOCATION:  [facility]                         FACILITY:  [facility]   PHYSICIAN:  Eduard Clos, MDDATE OF BIRTH:  Feb 10, 1933   DATE OF ADMISSION:  09/03/2008  DATE OF DISCHARGE:                              HISTORY & PHYSICAL   PRIMARY CARE PHYSICIAN:  Production assistant, radio.   PRIMARY CARDIOLOGIST:  Rosine Abe, M.D.   CHIEF COMPLAINT:  Shortness of breath.   HISTORY OF PRESENT ILLNESS:  A 77 year old male with a history of COPD,  ongoing tobacco abuse, alcohol abuse, chronic atrial fibrillation on  Coumadin and Rythmol who presented to the ER complaining of shortness of  breath which woke him up from sleep at 5 a.m.  It has been persistent,  and has had no recent exertion.  Denies any associated shortness of  breath.  Does have mild cough with productive sputum.  Denies any fever  or chills.  Denies chest pain.  Denies any dizziness or loss of  consciousness.  The patient was found to be having some infiltrates on x-  ray and hypoxia.  The patient has been admitted for further management  and evaluation of his possible pneumonia and COPD exacerbation.  In  addition, the patient is also found to be having atrial fibrillation  with rapid ventricular rate.  The patient presently will be started on  IV Cardizem for rate control.  The patient denies any abdominal pain,  nausea, vomiting, diarrhea, fever rule out chills.  or any loss of  consciousness or headache.   PAST MEDICAL HISTORY:  1. Hypertension.  2. Atrial fibrillation on Coumadin.  3. COPD.  4. Ongoing tobacco abuse.   PAST SURGICAL HISTORY:  Appendectomy.   MEDICATION PRIOR TO ADMISSION:  1. Coumadin 2.5 mg p.o. daily.  2. Advair Diskus 500/50 one puff b.i.d.  3. Albuterol HFA p.r.n.  4. Benicar 20/12.5 mg p.o. daily.  5. Nexium 20 mg p.o. daily.  6. Rythmol 225 mg p.o. b.i.d.  7.  Cardizem 180 mg p.o. b.i.d.  8. Mucinex 600 mg p.o. b.i.d.  9. Lasix 40 mg p.o. daily p.r.n.; the patient takes it only if he      needs it, mainly once a week.  Last done was yesterday.  The      patient also has received Lasix one dose today in the ER 40 mg.   ALLERGIES:  CODEINE.   SOCIAL HISTORY:  The patient lives with his family at home.  He is a  retired Surveyor, minerals.  Smokes cigarettes.  Has been quit smoking.  Drinks  whiskey daily, and denies any alcohol abuse.   FAMILY HISTORY:  Nothing contributory.   REVIEW OF SYSTEMS:  As per in history of present illness.  Nothing  significant.   PHYSICAL EXAMINATION:  GENERAL:  The patient is  in no acute distress.  VITAL SIGNS:  Blood pressure is 130/80, pulse 120 per minute and  irregular, respirations 22 per minute, temperature 98.2, O2 saturation  97% on 2 liters.  HEENT:  Anicteric sclerae.  Bilateral .  LUNGS:  Mild coarse crepitation heard in the bases.  No wheezes.  HEART:  S1 and S2 heard.  ABDOMEN:  Soft, nontender.  Bowel sounds heard.  CNS:  Alert, awake, oriented to time, place and person.  Muscle tone of  lower extremities 5/5.  EXTREMITIES:  Peripheral pulses felt.  No edema.   LABORATORY DATA:  EKG shows atrial fibrillation with heart rate of  around 102 per minute.  Chest x-ray shows there was worsening bilateral  perihilar and infrahilar interstitial prominence superimposed on chronic  interstitial prominence.  Findings may be due to early interstitial  edema or infectious pneumonitis.  CBC reveals WBC of 12.4, hemoglobin  19, hematocrit 56, platelets 111, neutrophils 91%, D-dimer 0.31.  Basic  metabolic panel revealed a sodium of 137, potassium 4.2, chloride 98,  glucose 110, BUN 12, creatinine 1.4.  CK-MB of 1.2.  Troponin I less  than 0.05.  BNP 107.   ASSESSMENT:  1. Atrial fibrillation with rapid ventricular response.  2. Probable pneumonia.  3. Chronic obstructive pulmonary disease exacerbation.  4.  Ongoing tobacco abuse.   PLAN:  Will admit the patient to telemetry.  Will place the patient on  IV antibiotics, Avelox.  Will place the patient on IV Cardizem infusion,  continue his Rythmol and Cardizem p.o.  Will place the patient on low-  dose IV steroids for his COPD exacerbation.  Will get blood cultures and  sputum cultures.  Will place the patient on precautions with the  addition of Tamiflu and further recommendations as his condition  warrants.      Eduard Clos, MD  Electronically Signed     ANK/MEDQ  D:  09/03/2008  T:  09/03/2008  Job:  [id number]

## 2011-02-18 NOTE — Discharge Summary (Signed)
[name], [name] NO.:  [id number]   MEDICAL RECORD NO.:  [id number]          PATIENT TYPE:  INP   LOCATION:  3706                         FACILITY:  [facility]   PHYSICIAN:  Eduard Clos, MDDATE OF BIRTH:  03-28-1933   DATE OF ADMISSION:  09/03/2008  DATE OF DISCHARGE:  09/08/2008                               DISCHARGE SUMMARY   COURSE IN THE HOSPITAL:  A 77 year old male with known history of COPD,  ongoing tobacco abuse, atrial fibrillation, presented with complaints of  shortness of breath.  In the ER, the patient also was found to be in  atrial fibrillation with rapid ventricular rate.  The patient also had a  chest x-ray, which showed the possibility of pneumonia.  The patient was  started on empiric antibiotics.  Initially, was on IV Cardizem to  control his heart rate.  His Cardizem was increased p.o. dose to 240  p.o. b.i.d. and his Rythmol was continued.  A 2-D echo was obtained  during this stay, which showed an overall left ventricular systolic  function was normal with EF ranging between 55-60%.  Cardiology consult  was obtained with Dr. Reyes Ivan, the patient's primary cardiologist.  The  patient's symptoms gradually improved.  The patient also was placed on  Tamiflu.  The patient had mild thrombocytopenia, which decreased up to  90,000 at the time of , it was around 111,000.  A HIT panel was done.  The screen test was positive, but confirmation test was negative.  At  this time, the patient is also on Coumadin for his AFib.  As the  patient's symptoms have largely resolved, the patient will be discharged  home with advice to quit smoking and to recheck his PT/INR on Monday,  that is September 11, 2008.   PROCEDURES DURING STAY:  A 2-D echo on September 04, 2008, shows overall  left ventricular systolic function was normal with EF ranging between 55-  60%.  Chest x-ray on September 03, 2008, shows worsening bilateral  perihilar and infrahilar  interstitial prominence, supra-imposed on  chronic interstitial prominence, may be due to early interstitial edema  or infectious pneumonitis.   FINAL DIAGNOSES:  1. Atrial fibrillation with rapid ventricular rate, presently      controlled.  2. Chronic obstructive pulmonary disease exacerbation.  3. Pneumonia.  4. Ongoing tobacco abuse.  5. Mild thrombocytopenia.   DISCHARGE MEDICATIONS:  1. Advair Diskus 500/50 one puff b.i.d.  2. Benicar HCT 20/12.5 mg p.o. daily.  3. Nexium 40 mg p.o. daily.  4. Rythmol 225 mg p.o. b.i.d.  5. Coumadin 7.5 mg p.o. on September 08, 2008, 5 mg from September 09, 2008.  To recheck his PT/INR with Dr. Reyes Ivan.  On September 11, 2008,      the patient is to call the office at [phone] and can schedule      appointment on Monday and check his Coumadin for which he has      agreed.  6. Nicotine patch.  7. Avelox 400 mg p.o. daily for 5 more days.  8. Prednisone tapering dose  for next 12 days.  9. Xopenex HFA 2 puffs q.6 p.r.n.  10.Atrovent HFA 2 puffs q.6 p.r.n.  11.Mucinex 600 mg p.o. b.i.d.  12.Lasix 40 mg p.o. daily p.r.n. if there is any edema as the patient      is to take earlier.   PLAN:  The patient to follow up with his primary care physician within a  week's time.  Recheck his complete metabolic panel.  To check his PT/INR  on September 11, 2008, at Dr. Silva Bandy office, which the patient usually  does, phone number is [phone].  The patient is to complete his  antibiotics as recommended.  The patient is to be on a cardiac healthy  diet.  Home O2 evaluation will be done and if patient qualifies, will be  discharged home on home O2.  Strongly advised to quit smoking, nicotine  patches prescription has been provided.      Eduard Clos, MD  Electronically Signed     ANK/MEDQ  D:  09/08/2008  T:  09/09/2008  Job:  [id number]

## 2011-02-18 NOTE — Op Note (Signed)
NAME:  [name], [name] NO.:  [id number]   MEDICAL RECORD NO.:  [id number]          PATIENT TYPE:  EMS   LOCATION:  ED                           [facility]ACILITY:  [facility]   PHYSICIAN:  Bernette Redbird, M.D.   DATE OF BIRTH:  [birth year]   DATE OF PROCEDURE:  08/14/2007  DATE OF DISCHARGE:                               OPERATIVE REPORT   PROCEDURE:  Upper endoscopy with removal of esophageal foreign body.   INDICATIONS:  A 77 year old gentleman with history of recurrent  dysphagia symptoms and food impactions, who had gone some years since  the time of his last food impaction, who was eating steak tonight and  the food got caught in his esophagus and did not respond to  administration of IV glucagon in the emergency room.   FINDINGS:  Successful removal of several pieces of steak from the  esophagus.  Prominent esophageal mucosal ring (Schatzki's ring) above  small hiatal hernia.   PROCEDURE:  The risks of the procedure had been reviewed with the  patient, who provided written consent.  He was taken from the emergency  room to the endoscopy unit for this procedure.  He was given topical  pharyngeal anesthesia and then intravenous sedation with Phenergan 12.5  mg IV due to take the fact that he has several mixed drinks each night,  plus fentanyl 50 mcg and Versed 2 mg IV.  The patient did desaturate  slightly down to about 88 or 89% but this responded very promptly to  raising his chin and turning up the oxygen flow rate.   He ran an oxygen saturation in the low 90s for most of the procedure.   The Pentax video endoscope was passed under direct vision.  The vocal  cords looked normal.  The esophagus was readily entered.   In the distal esophagus were at least a couple of chunks of meat.  The  polypectomy snare was used to grab these pieces and pull them out  through the mouth.  Approximately three or four passes with the scope  were made, and on the last attempt, the  largest piece of meat was  successfully engaged with the polypectomy snare, withdrawn out through  the patient's mouth, as had already been accomplished for some smaller  pieces.  After re-endoscoping the patient under direct vision, it was  evident that he had a prominent esophageal ring, although it did not  provide resistance to passage of the 10 mm endoscope.  Below this ring  was a small (approximately 2 cm) hiatal hernia.   The esophageal mucosa was normal.  There was no evidence of reflux  esophagitis, Barrett's esophagus or neoplasia.   The stomach contained some retained bilious food chyme in the proximal  stomach where as the distal stomach looked normal and a retroflexed view  of the cardia was unremarkable.  The duodenal bulb and second duodenum  looked normal.   The scope was then removed from the patient, who tolerated the procedure  well without apparent complications.   IMPRESSION:  1. Successful removal of esophageal foreign body as described above.  2. Esophageal mucosal ring (Schatzki's ring) accounting for the food      impaction.   PLAN:  The patient will follow-up with Dr. Madilyn Fireman who has worked with him  in the past and apparently done some dilatations, to consider possible  repeat dilatation.           ______________________________  Bernette Redbird, M.D.     RB/MEDQ  D:  08/14/2007  T:  08/16/2007  Job:  [id number]   cc:   Marjory Lies, M.D.  Fax: [phone]

## 2011-02-18 NOTE — Op Note (Signed)
[name], [name] NO.:  [id number]   MEDICAL RECORD NO.:  [id number]          PATIENT TYPE:  OIB   LOCATION:  2899                         FACILITY:  [facility]   PHYSICIAN:  Elmore Guise., M.D.DATE OF BIRTH:  1932-12-29   DATE OF PROCEDURE:  10/19/2008  DATE OF DISCHARGE:  10/19/2008                               OPERATIVE REPORT   INDICATIONS FOR PROCEDURE:  Atrial fibrillation.   HISTORY OF PRESENT ILLNESS:  [name] is a very pleasant 77 year old  white male with past medical history of COPD and atrial fibrillation,  who presents for elective cardioversion.  The patient was initially  diagnosed with atrial fibrillation 6 months ago.  At that time, we  elected to start him on Coumadin.  He has had good (therapeutic) INR for  the last 5 months.  We are now referring him for elective cardioversion  to see if this will help him with his exertional capacity.   DESCRIPTION OF PROCEDURE:  The patient presented to the Short-Stay Area.  He was in a fasting state.  Anesthesia Department was present.  The  patient was sedated with 110 mg of Diprivan.  The patient then underwent  successful DC cardioversion with a 200-joule synced shock.  Pads were in  the anterior and posterior positions.  The patient tolerated the  procedure well.  His heart rate was normal sinus rhythm in the 70-75 per  minute range.  Following the procedure, the patient was alert and  oriented with no focal deficits.  He will continue to be monitored for  30 minutes and then discharged home.   IMPRESSION:  Successful direct current cardioversion with a 200-joule  sync shock.   PLAN:  At this time, we will continue his medicines as before.  I would  like to see him back in the office in 1 week for EKG and office visit.      Elmore Guise., M.D.  Electronically Signed     TWK/MEDQ  D:  10/19/2008  T:  10/19/2008  Job:  [id number]   cc:   Dr. Adela Glimpse

## 2011-02-18 NOTE — H&P (Signed)
NAMEWORLEY, RADERMACHER NO.:  [id number]   MEDICAL RECORD NO.:  [id number]          PATIENT TYPE:  INP   LOCATION:  2305                         FACILITY:  [facility]   PHYSICIAN:  Unice Cobble, MD     DATE OF BIRTH:  09-03-1933   DATE OF ADMISSION:  03/05/2009  DATE OF DISCHARGE:                              HISTORY & PHYSICAL   CARDIOLOGIST:  Rosine Abe, M.D., [facility].   CHIEF COMPLAINTS:  Chest pain.   HISTORY OF PRESENT ILLNESS:  This is a 77 year old white male with a  history of hypertension, atrial fibrillation and COPD who presents with  chest pain.  The patient says his chest pain came on at rest at about  6:30 p.m.  It was substernal without radiation, and at its worst was a  5/10.  It was associated with nausea and diaphoresis but no shortness of  breath.  It lasted for 15-20 minutes.  He took a Nexium at the start of  it because he thought it might be associated with his stomach, and it  helped a little bit.  No prior symptoms like this in the past.  No  edema, orthopnea or PND.  No exertional component prior to this.  Currently chest pain free.   PAST MEDICAL HISTORY:  1. Hypertension.  2. Atrial fibrillation.  3. COPD.  4. Tobacco abuse.  5. Status post appendectomy.   ALLERGIES:  CODEINE.   MEDICATIONS:  1. Coumadin 2.5 mg daily.  2. Advair discus 500/50 mg one puff b.i.d.  3. Albuterol p.r.n.  4. Benicar 20/12.5 mg daily.  5. Nexium 20 mg daily.  6. Rythmol 225 mg b.i.d.  7. Cardizem 180 mg b.i.d.  8. Mucinex 600 mg b.i.d.  9. Lasix 40 mg p.r.n. (currently he is taking this every other day).   SOCIAL HISTORY:  Lives had home with his family.  He is a retired  Surveyor, minerals.  He still smokes four cigarettes daily.  He drinks daily  with 2-3 mixed drinks around the dinner hour.  No drugs.   FAMILY HISTORY:  Noncontributory.   REVIEW OF SYSTEMS:  The patient tells me that he does not have any blood  in his stool or  evidence of melena.  No urinary problems.  No cough.  His shortness of breath with wheezing is as per usual.  No fevers or  chills.  He actually passed out 5 days ago and injured his arm quite  extensively and has sutures in place.  This is the first time he has  passed out in 6-7 months.  Otherwise, his complete review of systems was  done and found to be negative, except as stated above in the HPI.   PHYSICAL EXAMINATION:  VITAL SIGNS:  Afebrile with a pulse of 71,  respiratory rate 18, blood pressure 122/71, O2 sats 98% on 2 liters.  GENERAL:  He is no acute distress.  Overweight.  HEENT:  PERRLA, EOMI, MMM, oropharynx without erythema or exudates.  NECK:  Supple without lymphadenopathy, thyromegaly, bruits or jugular  venous distention.  HEART:  Has an irregular  irregular rhythm without murmurs, gallops or  rubs.  Pulses 2+ and equal bilaterally without bruits.  LUNGS:  Bilateral expiratory wheezing, but no crackles in the bases.  SKIN:  Shows extensive tears and lacerations to the right arm.  These  are sutured.  There is mild erythema around the sutures, but there is no  frank cellulitis or pus.  ABDOMEN:  Soft and nontender with normal bowel sounds.  No rebound or  guarding.  EXTREMITIES:  Show no cyanosis, clubbing.  He has 2+ edema bilaterally.  MUSCULOSKELETAL:  Shows no joint deformity effusions or CVA tenderness.  NEUROLOGICAL:  He is alert and oriented x3 with cranial nerves II-XII  grossly intact.  Strength is 5/5 all extremities and axial groups.  Normal sensation throughout.   LABORATORY AND RADIOLOGY REVIEW:  EKG shows a rate of 60 and atrial  fibrillation.  He has nonspecific ST and T-wave changes.  Left anterior  fascicular block is present.  White count is 6.5, hemoglobin of 17.7,  platelet count is 132,000.  Creatinine is mildly elevated 1.5 with a BUN  of 17.  Potassium is 3.5.  INR is currently 2.4.  CK-MB and troponin are  negative on initial evaluation.    ASSESSMENT/PLAN:  This is a 77 year old white male with a history of  COPD, atrial fibrillation, and hypertension who presents with a single  episode of chest pain.  His chest pain has both typical and atypical  features.  With his history of hypertension and smoking, he is at high  risk for coronary artery disease and deserves rule out overnight with a  possible stress testing or catheterization in the morning.  I will  continue his home medication and add an aspirin to his regimen for  platelet inhibition.  Coumadin will be held tonight in case cardiac  catheterization is preferred in the near future.  His creatinine is  mildly elevated, but he does have fair amount of edema.  I will continue  his Lasix on a daily basis and if he goes catheterization, then Mucomyst  can be added at that time.      Unice Cobble, MD  Electronically Signed     ACJ/MEDQ  D:  03/05/2009  T:  03/06/2009  Job:  [id number]

## 2011-02-18 NOTE — Discharge Summary (Signed)
[name], [name] NO.:  [id number]   MEDICAL RECORD NO.:  [id number]          PATIENT TYPE:  INP   LOCATION:  2022                         FACILITY:  [facility]   PHYSICIAN:  Elmore Guise., M.D.DATE OF BIRTH:  April 23, 1933   DATE OF ADMISSION:  03/05/2009  DATE OF DISCHARGE:  03/07/2009                               DISCHARGE SUMMARY   DISCHARGE DIAGNOSES:  1. Chest pain.  2. History of chronic obstructive pulmonary disease.  3. Chronic atrial fibrillation.  4. Reflux disease.   HISTORY OF PRESENT ILLNESS:  [name] is a very pleasant 77 year old  white male, who presented to the hospital for evaluation of chest  discomfort.  Because of an abnormal ECG, the patient had multiple  cardiac risk factors.  The patient was kept for further evaluation and  treatment.   The patient's hospital course was uncomplicated.  He ruled out for  myocardial infarction.  He was transferred from the ICU to telemetry  monitoring.  His heart rate has been well controlled.  He has been up  and ambulatory with no further chest pain.  We did increase his Cardizem  up slightly during his hospitalization and he started feeling better  after this.  We continued his PPI.  I did hold his Coumadin in possible  preparation for cardiac catheterization, however, his Coumadin level  continued be high for the next 2 days.  After full discussion and since  he is having no further discomfort, we decided that he was safe for  discharge.  We will follow him in the office next Monday, June 7 at his  regular scheduled office visit.  I will repeat a PT/INR at that time.  If it is less than 1.5, we will schedule him for outpatient  catheterization.   DISCHARGE MEDICATIONS:  1. Cardizem 90 mg 2 tablets in the morning, 1 at lunch, and 2 in the      evening.  2. Advair inhaler 500/50 one puff twice daily.  3. Nexium 1 tablet daily.  4. Albuterol inhaler 2 puffs every 6 hours as needed for  shortness of      breath.  5. Lasix 40 mg on a p.r.n. basis (currently taking every other day).  6. Mucinex as needed for cough.  7. He was also instructed to take aspirin since he is off his      Coumadin.   He is to call the office if he has any further problems.  Otherwise, I  will see him at his regular scheduled office visit on Monday, June 7.      Elmore Guise., M.D.  Electronically Signed     TWK/MEDQ  D:  03/07/2009  T:  03/07/2009  Job:  [id number]

## 2011-02-21 NOTE — Op Note (Signed)
NAMEVELMA, HANNA NO.:  [id number]   MEDICAL RECORD NO.:  [id number]          PATIENT TYPE:  OIB   LOCATION:  2899                         FACILITY:  [facility]   PHYSICIAN:  Gita Kudo, M.D. DATE OF BIRTH:  05/08/1933   DATE OF PROCEDURE:  08/05/2004  DATE OF DISCHARGE:                                 OPERATIVE REPORT   PREOPERATIVE DIAGNOSIS:  Abnormal gallbladder.   POSTOPERATIVE DIAGNOSIS:  Abnormal gallbladder, normal cholangiogram, upper  abdominal right-sided adhesions.   PROCEDURE:  Laparoscopic cholecystectomy with intraoperative cholangiogram.   SURGEON:  Gita Kudo, M.D.   ASSISTANT:  Maple Hudson.   ANESTHESIA:  General endotracheal anesthesia.   CLINICAL SUMMARY:  A 77 year old male with bouts of abdominal pain.  Recent  work-up revealed negative cardiac disease but positive HIDA scan.  His  ultrasound did not have stones and his LFTs were normal.   FINDINGS:  The patient had some fairly intense adhesions from his  gallbladder to the diaphragm and omentum.  The gallbladder itself was not  enlarged.  The cystic duct and artery were normal in anatomy and the  cholangiogram looked normal also.   DESCRIPTION OF PROCEDURE:  Under satisfactory general anesthesia, having  received 1.0 g Ancef preoperatively, the patient's abdomen was prepped and  draped in the standard fashion.  A total of 30 mL of 0.5% Marcaine with  epinephrine was infiltrated for postoperative analgesia at the skin  incisions.  A transverse supraumbilical incision made, midline opened  intraperitoneum.  Midline opened intraperitoneum, controlled with a figure-  of-eight 0 Vicryl suture.  Operating [place] port inserted.  Good CO2  pneumoperitoneum established and camera placed.  Under direct vision, two #5  ports placed laterally and a second #10 medially.  Operating through the  medial port, I carefully took down the adhesions with the cauterizing  scissors.  Then graspers  placed and good exposure obtained.  The cystic  artery and cystic duct were each identified and circumferentially dissected.  Multiple clips placed on the artery and a single clip on the cystic duct  near the gallbladder.  A percutaneous catheter was then placed and a good  cholangiogram was obtained.  The catheter was withdrawn and the duct  controlled with multiple clips and it and the artery divided.  Gallbladder  then removed from below upward using coagulating spatula for hemostasis and  dissection.  The the gallbladder was grasped with a grasper after the camera  was moved to the upper port and withdrawn through the umbilical port without  complications.  It had been placed in an EndoCatch bag because of the small  hole made in it.  The operative site was lavaged with saline and the returns  were clear.  Following this, the area was inspected.  Hemostatic.  Suctioned  dry and then the ports and CO2 released.  The  midline closed with a previous figure-of-eight and a second interrupted  Vicryl.  Subcutaneous with 4-0 Vicryl and skin edges with Steri-Strips.  No  complications and the patient went to the recovery room from the operating  room in good  condition without complications.       MRL/MEDQ  D:  08/05/2004  T:  08/05/2004  Job:  [id number]   cc:   Teena Irani. Arlyce Dice, M.D.  [id number]  [place]  [place] [address]  Fax: [phone]

## 2011-02-21 NOTE — Op Note (Signed)
NAME:  [name], [name]                        ACCOUNT NO.:  [id number]   MEDICAL RECORD NO.:  [id number]                   PATIENT TYPE:  AMB   LOCATION:  [facility]                                 [facility]ACILITY:  [facility]   PHYSICIAN:  Courtney Paris, M.D.          DATE OF BIRTH:  [birth year]   DATE OF PROCEDURE:  04/19/2003  DATE OF DISCHARGE:                                 OPERATIVE REPORT   PREOPERATIVE DIAGNOSES:  1. Left flank pain.  2. Hematuria.  3. Bilateral renal calculi.   POSTOPERATIVE DIAGNOSES:  1. Left flank pain.  2. Hematuria.  3. Bilateral renal calculi.   PROCEDURES:  1. Cystoscopy.  2. Bilateral retrograde pyelograms.   SURGEON:  Courtney Paris, M.D.   ASSISTANT:  Susanne Borders, M.D.   ANESTHESIA:  General endotracheal.   INDICATION FOR PROCEDURE:  [name] is a 77 year old male who has  recently had hematuria and left flank pain.  The patient does have a history  of stones.  He has been evaluated with CT scans in the office for the past  couple of weeks and no obvious ureteral or obstructing calculi were  appreciated.  The patient has not had hydronephrosis or any other  abnormalities on his CT scan.  Yesterday he underwent a KUB, which  demonstrated a questionable small ureteral calculus.  He was counseled, and  he elected to undergo cystoscopy with bilateral retrograde pyelogram with  ureteroscopy if any ureteral calculi were appreciated.  He also needs  cystoscopy with upper tract imaging to evaluate his hematuria, as he does  have a smoking history.   DESCRIPTION OF PROCEDURE:  Patient brought to the operating room and  correctly identified by his identification bracelet.  He was given  preoperative antibiotics and placed in the dorsal lithotomy position.  He  was given general endotracheal anesthesia and prepped and draped in typical  sterile fashion.  The 22 cystoscope with 12 degree lens was used to enter  the patient's urethra.  The  anterior and posterior urethra were within  normal limits.  No mucosal abnormalities were appreciated.  The patient had  moderate trilobar prostatic hypertrophy but did not appear to be obstructed.  The patient's bladder was noted for 3+ trabeculation.  The urothelium was  carefully surveyed and no urothelial abnormalities were appreciated.  Both  ureteral orifices were easily identified effluxing clear urine.  A 6 French  end-hole ureteral catheter was used to cannulate both ureteral orifices and  injected approximately 5 mL of Cysto-Conray.  There was no hydronephrosis  bilaterally.  The calcification at the L3 level that was previously  appreciated on a KUB was not evident as a filling defect on the retrograde  pyelogram on the left side.  This calcification was outside the ureter.  No  filling defects were appreciated on either collecting system or ureter.  Next the ureteral catheter was removed and the patient's bladder was drained  and the procedure was terminated.  The patient awakened from his anesthesia  without complications.  He was taken to the postanesthesia care unit in  stable condition.  Please note that Dr. Aldean Ast was present and  participated throughout all aspects of this case.   DISPOSITION:  To the postanesthesia care unit, but the patient will probably  undergo extracorporeal shockwave lithotripsy next week.      Susanne Borders, MD                           Courtney Paris, M.D.    DR/MEDQ  D:  04/19/2003  T:  04/19/2003  Job:  [id number]

## 2011-04-05 ENCOUNTER — Inpatient Hospital Stay (HOSPITAL_COMMUNITY)
Admission: EM | Admit: 2011-04-05 | Discharge: 2011-04-10 | DRG: 190 | Disposition: A | Discharge status: Home / Self Care | Payer: Medicare Other | Attending: Hospitalist | Admitting: Hospitalist

## 2011-04-05 ENCOUNTER — Emergency Department (HOSPITAL_COMMUNITY): Payer: Medicare Other

## 2011-04-05 DIAGNOSIS — D696 Thrombocytopenia, unspecified: Secondary | ICD-10-CM | POA: Diagnosis present

## 2011-04-05 DIAGNOSIS — L719 Rosacea, unspecified: Secondary | ICD-10-CM | POA: Diagnosis present

## 2011-04-05 DIAGNOSIS — J441 Chronic obstructive pulmonary disease with (acute) exacerbation: Principal | ICD-10-CM | POA: Diagnosis present

## 2011-04-05 DIAGNOSIS — I2510 Atherosclerotic heart disease of native coronary artery without angina pectoris: Secondary | ICD-10-CM | POA: Diagnosis present

## 2011-04-05 DIAGNOSIS — E860 Dehydration: Secondary | ICD-10-CM | POA: Diagnosis present

## 2011-04-05 DIAGNOSIS — S51009A Unspecified open wound of unspecified elbow, initial encounter: Secondary | ICD-10-CM | POA: Diagnosis present

## 2011-04-05 DIAGNOSIS — J189 Pneumonia, unspecified organism: Secondary | ICD-10-CM | POA: Diagnosis present

## 2011-04-05 DIAGNOSIS — K219 Gastro-esophageal reflux disease without esophagitis: Secondary | ICD-10-CM | POA: Diagnosis present

## 2011-04-05 DIAGNOSIS — X58XXXA Exposure to other specified factors, initial encounter: Secondary | ICD-10-CM | POA: Diagnosis present

## 2011-04-05 DIAGNOSIS — I4891 Unspecified atrial fibrillation: Secondary | ICD-10-CM | POA: Diagnosis present

## 2011-04-05 DIAGNOSIS — I10 Essential (primary) hypertension: Secondary | ICD-10-CM | POA: Diagnosis present

## 2011-04-05 LAB — CBC
MCH: 32.4 pg (ref 26.0–34.0)
MCHC: 33.5 g/dL (ref 30.0–36.0)
Platelets: 121 10*3/uL — ABNORMAL LOW (ref 150–400)
RDW: 14.9 % (ref 11.5–15.5)

## 2011-04-05 LAB — DIFFERENTIAL
Basophils Absolute: 0 10*3/uL (ref 0.0–0.1)
Basophils Relative: 0 % (ref 0–1)
Eosinophils Absolute: 0 10*3/uL (ref 0.0–0.7)
Eosinophils Relative: 0 % (ref 0–5)
Monocytes Absolute: 0.7 10*3/uL (ref 0.1–1.0)
Monocytes Relative: 5 % (ref 3–12)

## 2011-04-05 LAB — PROTIME-INR
INR: 2.59 — ABNORMAL HIGH (ref 0.00–1.49)
Prothrombin Time: 28.2 seconds — ABNORMAL HIGH (ref 11.6–15.2)

## 2011-04-05 LAB — CARDIAC PANEL(CRET KIN+CKTOT+MB+TROPI)
Relative Index: INVALID (ref 0.0–2.5)
Troponin I: <0.3 ng/mL (ref <0.30)
Troponin I: <0.3 ng/mL (ref <0.30)

## 2011-04-05 LAB — COMPREHENSIVE METABOLIC PANEL
AST: 16 U/L (ref 0–37)
Albumin: 3.3 g/dL — ABNORMAL LOW (ref 3.5–5.2)
Alkaline Phosphatase: 79 U/L (ref 39–117)
Chloride: 104 mEq/L (ref 96–112)
Creatinine, Ser: 1.06 mg/dL (ref 0.50–1.35)
Potassium: 3.6 mEq/L (ref 3.5–5.1)
Sodium: 138 mEq/L (ref 135–145)
Total Bilirubin: 1 mg/dL (ref 0.3–1.2)

## 2011-04-05 LAB — D-DIMER, QUANTITATIVE: D-Dimer, Quant: 0.52 ug/mL-FEU — ABNORMAL HIGH (ref 0.00–0.48)

## 2011-04-05 LAB — PRO B NATRIURETIC PEPTIDE: Pro B Natriuretic peptide (BNP): 1488 pg/mL — ABNORMAL HIGH (ref 0–450)

## 2011-04-05 LAB — CK TOTAL AND CKMB (NOT AT ARMC): Relative Index: INVALID (ref 0.0–2.5)

## 2011-04-06 ENCOUNTER — Inpatient Hospital Stay (HOSPITAL_COMMUNITY): Payer: Medicare Other

## 2011-04-06 LAB — COMPREHENSIVE METABOLIC PANEL
ALT: 11 U/L (ref 0–53)
AST: 13 U/L (ref 0–37)
Albumin: 2.7 g/dL — ABNORMAL LOW (ref 3.5–5.2)
Calcium: 9.2 mg/dL (ref 8.4–10.5)
Creatinine, Ser: 1.06 mg/dL (ref 0.50–1.35)
Sodium: 139 mEq/L (ref 135–145)
Total Protein: 6.3 g/dL (ref 6.0–8.3)

## 2011-04-06 LAB — CBC
Platelets: 124 10*3/uL — ABNORMAL LOW (ref 150–400)
RBC: 4.16 MIL/uL — ABNORMAL LOW (ref 4.22–5.81)
RDW: 14.9 % (ref 11.5–15.5)
WBC: 13.6 10*3/uL — ABNORMAL HIGH (ref 4.0–10.5)

## 2011-04-06 LAB — PROTIME-INR: INR: 2.24 — ABNORMAL HIGH (ref 0.00–1.49)

## 2011-04-06 LAB — DIFFERENTIAL
Eosinophils Absolute: 0 10*3/uL (ref 0.0–0.7)
Lymphs Abs: 1 10*3/uL (ref 0.7–4.0)
Monocytes Relative: 4 % (ref 3–12)
Neutro Abs: 12.1 10*3/uL — ABNORMAL HIGH (ref 1.7–7.7)
WBC Morphology: INCREASED

## 2011-04-06 LAB — APTT: aPTT: 49 seconds — ABNORMAL HIGH (ref 24–37)

## 2011-04-06 MED ORDER — IOHEXOL 300 MG/ML  SOLN
100.0000 mL | Freq: Once | INTRAMUSCULAR | Status: AC | PRN
  Administered 2011-04-06: 100 mL via INTRAVENOUS

## 2011-04-06 NOTE — H&P (Signed)
NAME:  [name], HEMMER NO.:  [id number]  MEDICAL RECORD NO.:  [id number]  LOCATION:  [facility]                         FACILITY:  [facility]  PHYSICIAN:  Talmage Nap, MD  DATE OF BIRTH:  05-18-1933  DATE OF ADMISSION:  04/05/2011 DATE OF DISCHARGE:                             HISTORY & PHYSICAL   PRIMARY CARE PHYSICIAN:  Marjory Lies, M.D., [facility]  PRIMARY CARDIOLOGIST:  Georga Hacking, M.D.  History obtainable from the patient and the patient's spouse.  CHIEF COMPLAINT:  Shortness of breath, cough, fever of 1 day's duration.  HISTORY OF PRESENT ILLNESS:  The patient is a 77 year old Caucasian male with history of COPD and atrial fibrillation was said to have been in stable health until the day prior to presenting to the emergency room. The patient observed that he was getting short of breath and the shortness of breath was getting progressively worse.  This was said to be associated with cough that was productive of whitish mucousy sputum. He denied any associated chest pain, but he claimed he was febrile, had chills and rigor.  He denied any vomiting.  He denied any history of PND or orthopnea.  He denied any diarrhea or hematochezia.  Symptoms were said to be getting progressively worse and subsequently the patient came to the emergency room to be evaluated.  PAST MEDICAL HISTORY: 1. Positive for coronary artery disease. 2. COPD. 3. Hypertension. 4. History of reflux. 5. Atrial fibrillation. 6. History of kidney stone.  PAST SURGICAL HISTORY: 1. Cholecystectomy. 2. Appendectomy. 3. Esophageal stricture status post bougienage or dilatation.  MEDICATIONS:  Preadmission meds without doses include, 1. Albuterol prn. 2. Digoxin 0.25 mg p.o. daily. 3. Diltiazem HCl 180 mg p.o. q.a.m., 90 mg p.o. at lunch, and 180 mg     at bedtime. 4. Famotidine. 5. Furosemide 40 mg p.o. daily. 6. Mucinex DM 1200 mg p.o. b.i.d. 7. Warfarin 5 mg twice  weekly and 2.5 mg 5 times weekly. 8. Zolpidem 5 mg at bedtime.  ALLERGIES:  Codeine.  SOCIAL HISTORY:  Negative for alcohol or tobacco use.  The patient lives at home with his spouse.  FAMILY HISTORY:  York Spaniel to be positive for hypertension.  REVIEW OF SYSTEMS:  The patient denies any history of headaches or blurred vision.  No nausea or vomiting.  Complained about cough with associated shortness of breath.  Cough is said to be productive of whitish mucousy sputum.  The patient denies any PND or orthopnea.  The patient claimed he had fever, chills, and rigor.  He denied any diarrhea or hematochezia.  No dysuria or hematuria.  No swelling of the lower extremity.  No intolerance to heat or cold and no neuropsychiatric disorder.  PHYSICAL EXAMINATION:  GENERAL:  Elderly man, dehydrated with hypertrophied sebaceous glands of nasal bridge. VITAL SIGNS:  Blood pressure is 126/66, pulse is 122, respiratory rate 18, temperature 100.2. HEENT:  Pallor.  Pupils are reactive to light and extraocular muscles are intact. NECK:  No jugular venous distention.  No carotid bruit.  No lymphadenopathy. CHEST:  Minimal scattered rhonchi with decreased air entry globally worse on the lung bases, left more than the right. HEART:  Sounds are  one and two tachycardic. ABDOMEN:  Soft, nontender.  Liver, spleen, and kidney not palpable. Bowel sounds are positive. EXTREMITIES:  No pedal edema. NEUROLOGIC:  Nonfocal. MUSCULOSKELETAL:  Arthritic changes in the knees and in the feet. NEUROPSYCHIATRIC:  Evaluation is unremarkable. SKIN:  Decreased turgor.  LABORATORY DATA:  Initially, digoxin level was 0.5.  First set of cardiac marker; troponin I less than 0.30.  CK-MB is 48.  Chemistry shows sodium of 138, potassium of 3.6, chloride of 140, bicarb of 24, glucose is 95, BUN is 15, and creatinine is 1.06.  LFT normal. Coagulation profile showed PT 20.2, INR 5.58.  Hematology indices showed WBC of 40.6,  hemoglobin of 14.4, hematocrit of 43.0, MCV of 96.6 with a platelet count of 121, neutrophils is 90% and absolute granulocyte count is 13.1.  IMAGING DATA:  EKG showed atrial fibrillation with a rate of 106 with multiple atrial tachycardia.  Imaging studies done include chest x-ray, which showed left lower lobe active disease concerning for pneumonia.  IMPRESSION: 1. Chronic obstructive pulmonary disease exacerbation. 2. Questionable left lower lobe pneumonia. 3. Dehydration. 4. Atrial fibrillation. 5. Gastroesophageal reflux disease. 6. Coronary artery disease. 7. History of esophageal stricture status post bougienage and     dilatation. 8. Hypertension. 9. Thrombocytopenia. 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  The patient will be slowly rehydrated with half-normal saline IV to go at a rate of 75 mL an hour. Event O2 via nasal cannula 3 times per minute.  In addition, the patient will also be given breathing treatment and Atrovent and Detrol nebs q.4 h.  He was started on Zosyn 3.35 g IV q.8 h. and vancomycin 1 g IV stat and this needs to be done by pharmacy.  Fever will be controlled with ibuprofen 400 mg p.o. t.i.d. p.r.n.  For atrial fibrillation, ventricular rate to be controlled with Lopressor 50 mg p.o. b.i.d. and diltiazem SR 240 mg p.o. daily.  The patient will be restarted on warfarin and dosing to be done by pharmacy and GI prophylaxis will be with Protonix 40 mg IV q.24 h.  Further labs to be done on this patient will include cardiac enzymes q.6 h. x3, D-dimer stat, and blood culture x2 before starting IV antibiotics, CBC, CMP, and magnesium will be done in a.m.  The patient will also have PT, PTT, and INR done in a.m.  In addition, the patient also will have a 2-D echo done as well.  He will be followed and evaluated on daily basis.     Talmage Nap, MD     CN/MEDQ  D:  04/05/2011  T:  04/05/2011  Job:  [id number]  Electronically Signed  by Talmage Nap  on 04/06/2011 07:04:05 AM

## 2011-04-07 LAB — COMPREHENSIVE METABOLIC PANEL
AST: 11 U/L (ref 0–37)
Albumin: 2.7 g/dL — ABNORMAL LOW (ref 3.5–5.2)
Alkaline Phosphatase: 84 U/L (ref 39–117)
BUN: 21 mg/dL (ref 6–23)
Chloride: 104 mEq/L (ref 96–112)
Potassium: 3.9 mEq/L (ref 3.5–5.1)
Total Bilirubin: 0.3 mg/dL (ref 0.3–1.2)

## 2011-04-07 LAB — DIFFERENTIAL
Basophils Absolute: 0 10*3/uL (ref 0.0–0.1)
Eosinophils Relative: 0 % (ref 0–5)
Lymphocytes Relative: 9 % — ABNORMAL LOW (ref 12–46)
Neutro Abs: 8.9 10*3/uL — ABNORMAL HIGH (ref 1.7–7.7)
Neutrophils Relative %: 85 % — ABNORMAL HIGH (ref 43–77)

## 2011-04-07 LAB — CBC
HCT: 37.7 % — ABNORMAL LOW (ref 39.0–52.0)
Platelets: 135 10*3/uL — ABNORMAL LOW (ref 150–400)
RBC: 3.85 MIL/uL — ABNORMAL LOW (ref 4.22–5.81)
RDW: 15.2 % (ref 11.5–15.5)
WBC: 10.5 10*3/uL (ref 4.0–10.5)

## 2011-04-07 LAB — APTT: aPTT: 53 seconds — ABNORMAL HIGH (ref 24–37)

## 2011-04-07 LAB — PROTIME-INR: INR: 2.41 — ABNORMAL HIGH (ref 0.00–1.49)

## 2011-04-07 NOTE — Discharge Summary (Signed)
[name], NATZKE NO.:  [id number]  MEDICAL RECORD NO.:  [id number]  LOCATION:  3739                         FACILITY:  [facility]  PHYSICIAN:  Talmage Nap, MD  DATE OF BIRTH:  1933/04/07  DATE OF ADMISSION:  04/05/2011 DATE OF DISCHARGE:  04/08/2011                        DISCHARGE SUMMARY - REFERRING   PRIMARY CARE PHYSICIAN:  Marjory Lies, M.D., Family Practice  PRIMARY CARDIOLOGIST:  Georga Hacking, M.D.  DISCHARGE DIAGNOSES: 1. Chronic obstructive pulmonary disease exacerbation. 2. Questionable left lower lobe pneumonia. 3. Dehydration. 4. Atrial fibrillation. 5. Gastroesophageal reflux disease. 6. Coronary artery disease. 7. History of esophageal stricture, status post bougienage. 8. Hypertension. 9. Thrombocytopenia. 10.Rhinophyma. 11.Pulmonary nodules.   HISTORY OF PRESENT ILLNESS:  The patient is a 77 year old Caucasian male with history of COPD and atrial fibrillation, who was admitted to [facility] on April 05, 2011 by me, Dr. Talmage Nap with 1-day history of shortness of breath, cough, as well as fever.  The cough was said to be productive of whitish sputum.  He denied any associated chest pain.  He was febrile but denied any chills or rigor.  He denied any vomiting.  He denied any PND or orthopnea.  He denied any diarrhea or hematochezia.  Symptoms were said to be getting progressively worse, hence he presented to the emergency room to be evaluated.  PREADMISSION MEDICATIONS: 1. Albuterol p.r.n. 2. Digoxin 0.125 mg p.o. daily. 3. Diltiazem HCl 180 mg p.o. q.a.m., 80 mg p.o. at lunch and 180 mg at     bedtime. 4. Famotidine. 5. Furosemide 40 mg p.o. daily. 6. Mucinex DM 1200 mg p.o. b.i.d. 7. Warfarin 5 mg twice weekly and 2.5 mg 5 times weekly. 8. Zolpidem 5 mg at bedtime.  ALLERGIES:  CODEINE.  PAST SURGICAL HISTORY: 1. Cholecystectomy. 2. Appendectomy. 3. Esophageal stricture, status post bougienage or  dilatation.  SOCIAL HISTORY:  Negative for alcohol or tobacco use.  The patient lives at home with his spouse.  FAMILY HISTORY:  York Spaniel to be positive for hypertension.  REVIEW OF SYSTEMS:  Essentially documented in the initial history and physical.  PHYSICAL EXAMINATION:  GENERAL:  At the time the patient was seen by me, he was dehydrated, not in any distress.  He had hypertrophied sebaceous gland of the nasal bridge. VITAL SIGNS:  Blood pressure is 126/66, pulse is 122, respiratory rate is 18, temperature is 100.2. HEENT:  Pupils are reactive to light and extraocular muscles are intact. NECK:  No jugular venous distention.  No carotid bruits.  No lymphadenopathy. CHEST:  Showed minimal scattered rhonchi with decreased air entry globally, worse on the lung bases, left more than the right. HEART:  Sounds are one and two, tachycardic. ABDOMEN:  Soft, nontender.  Liver, spleen, and kidneys are not palpable. Bowel sounds are positive. EXTREMITIES:  No pedal edema. NEUROLOGIC:  Nonfocal. MUSCULOSKELETAL:  Showed arthritic changes in the knees and in the feet. NEUROPSYCHIATRIC:  Unremarkable. SKIN:  Showed decreased turgor.  LABORATORY DATA:  Initial complete blood count with differential showed WBC of 14.6, hemoglobin of 14.4, hematocrit 43.0, MCV of 96.6 with a platelet count of 121, neutrophils 90%.  Coagulation profile showed PT 28.2, INR 2.59.  Cardiac markers;  troponin-I less than 0.30, less than 0.30, less than 0.30 respectively.  CK-MB within normal range.  Pro-BNP 1488.  Comprehensive metabolic panel showed sodium of 133, potassium of 3.6, chloride of 104 with a bicarb of 24, glucose is 95, BUN is 18, creatinine is 1.06.  Digoxin level is 0.5, D-dimer is 0.52.  Blood cultures x2 negative and magnesium level is 2.2.  A repeat complete blood count with differential done on April 07, 2011 showed WBC of 10.5, hemoglobin of 12.5, hematocrit of 37.7, MCV of 97.9 with a  platelet count of 135, neutrophils 85%.  Comprehensive metabolic panel showed sodium of 140, potassium of 3.9, chloride of 104 with a bicarb of 26, glucose is 107, BUN is 21, creatinine is 1.09.  Magnesium level is 2.3. Coagulation profile showed PT 26.6, INR 2.41, and APTT of 53.  Initial EKG done on admission showed atrial fibrillation with a rate of 106 with multiple atrial tachycardia.  Imaging studies done include chest x-ray which showed lower lobe airspace disease concerning for pneumonia and CT angiogram showed multiple right lung pulmonary nodules, most of which are stable.  A 2-D echo which was done on July 1 showed normal ventricular cavity, EF of 55- 60%.  No PA pressure given.  HOSPITAL COURSE:  The patient was admitted to telemetry.  He was slowly rehydrated with half-normal saline IV to go at rate of 75 mL an hour. He was placed on oxygen via nasal cannula 3 L per minute and then started on Zosyn 3.35 g IV daily and vancomycin 1 g IV stat and subsequent dosing was done by pharmacy.  His fever was controlled with ibuprofen 400 mg p.o. t.i.d., and he was also given Lopressor 50 mg p.o. b.i.d., diltiazem XR 240 mg p.o. daily, and anticoagulation was done by pharmacy.  The patient was subsequently evaluated and followed by me on a daily basis and made remarkable progress.  On April 06, 2011, he had a CT thorax done with PE protocol and was negative.  So far the patient has remained clinically stable.  Denied any right chest.  Denies any shortness of breath.  Examination of the patient so far has been unremarkable.  He was seen by me today.  Vital signs; blood pressure is 99/60, temperature is 98.9, pulse 76, respiratory rate 18, medically stable.  Plan is for the patient to be discharged home on April 08, 2011 on activity as tolerated.  Low-sodium, low-cholesterol diet.  He will be followed up by his primary care physician in 1-2 weeks. Repeat CT Thorax in 6-12 months to follow  up on pulmonary nodules.  Medication to be taken at home will include: 1. Augmentin 500 mg one p.o. t.i.d. for the next 7 days. 2. Ambient (zolpidem) 10 mg half a tablet p.o. daily at bedtime p.r.n. 3. Cardizem 90 mg 2 tablets p.o. b.i.d. 4. Digoxin 0.125 mg one p.o. daily at bedtime. 5. Furosemide (Lasix) 40 mg one p.o. every other day p.r.n. 6. Potassium chloride 20 mEq one tablet p.o. daily. 7. Proventil (albuterol) inhaler 1 puff q.4 h. p.r.n. 8. Symbicort (budesonide/formoterol) 160/4.5 mcg 1 puff b.i.d. 9. Warfarin take 5 mg on Wednesdays and other days 2.5 mg.     Talmage Nap, MD     CN/MEDQ  D:  04/07/2011  T:  04/07/2011  Job:  [id number]  cc:   Marjory Lies, M.D.  Electronically Signed by Talmage Nap  on 04/07/2011 03:16:22 PM

## 2011-04-08 LAB — COMPREHENSIVE METABOLIC PANEL
ALT: 34 U/L (ref 0–53)
AST: 30 U/L (ref 0–37)
Albumin: 2.4 g/dL — ABNORMAL LOW (ref 3.5–5.2)
CO2: 25 mEq/L (ref 19–32)
Chloride: 105 mEq/L (ref 96–112)
Creatinine, Ser: 0.88 mg/dL (ref 0.50–1.35)
GFR calc non Af Amer: >60 mL/min (ref >60)
Sodium: 138 mEq/L (ref 135–145)
Total Bilirubin: 0.4 mg/dL (ref 0.3–1.2)

## 2011-04-08 LAB — CARDIAC PANEL(CRET KIN+CKTOT+MB+TROPI)
CK, MB: 1.8 ng/mL (ref 0.3–4.0)
CK, MB: 1.9 ng/mL (ref 0.3–4.0)
Relative Index: INVALID (ref 0.0–2.5)
Relative Index: INVALID (ref 0.0–2.5)
Relative Index: INVALID (ref 0.0–2.5)
Total CK: 25 U/L (ref 7–232)
Total CK: 23 U/L (ref 7–232)

## 2011-04-08 LAB — CBC
MCH: 31.8 pg (ref 26.0–34.0)
MCHC: 32.7 g/dL (ref 30.0–36.0)
MCV: 97.3 fL (ref 78.0–100.0)
Platelets: 122 10*3/uL — ABNORMAL LOW (ref 150–400)
RDW: 14.9 % (ref 11.5–15.5)

## 2011-04-08 LAB — DIFFERENTIAL
Eosinophils Absolute: 0.1 10*3/uL (ref 0.0–0.7)
Eosinophils Relative: 2 % (ref 0–5)
Lymphs Abs: 0.9 10*3/uL (ref 0.7–4.0)
Monocytes Relative: 9 % (ref 3–12)

## 2011-04-09 LAB — CBC
MCH: 31.8 pg (ref 26.0–34.0)
MCHC: 32.8 g/dL (ref 30.0–36.0)
MCV: 96.9 fL (ref 78.0–100.0)
Platelets: 142 10*3/uL — ABNORMAL LOW (ref 150–400)
RDW: 14.8 % (ref 11.5–15.5)
WBC: 6.7 10*3/uL (ref 4.0–10.5)

## 2011-04-09 LAB — COMPREHENSIVE METABOLIC PANEL
AST: 42 U/L — ABNORMAL HIGH (ref 0–37)
Albumin: 2.5 g/dL — ABNORMAL LOW (ref 3.5–5.2)
BUN: 16 mg/dL (ref 6–23)
Calcium: 9 mg/dL (ref 8.4–10.5)
Creatinine, Ser: 1.01 mg/dL (ref 0.50–1.35)
Total Protein: 6.1 g/dL (ref 6.0–8.3)

## 2011-04-09 LAB — DIFFERENTIAL
Basophils Absolute: 0 10*3/uL (ref 0.0–0.1)
Basophils Relative: 0 % (ref 0–1)
Lymphocytes Relative: 14 % (ref 12–46)
Neutro Abs: 4.7 10*3/uL (ref 1.7–7.7)
Neutrophils Relative %: 71 % (ref 43–77)

## 2011-04-09 LAB — MAGNESIUM: Magnesium: 2.2 mg/dL (ref 1.5–2.5)

## 2011-04-09 LAB — PROTIME-INR: Prothrombin Time: 22.7 seconds — ABNORMAL HIGH (ref 11.6–15.2)

## 2011-04-10 LAB — PROTIME-INR
INR: 1.64 — ABNORMAL HIGH (ref 0.00–1.49)
Prothrombin Time: 19.7 seconds — ABNORMAL HIGH (ref 11.6–15.2)

## 2011-04-11 LAB — CULTURE, BLOOD (ROUTINE X 2)
Culture  Setup Time: 201206302010
Culture: NO GROWTH
Culture: NO GROWTH

## 2011-04-11 NOTE — Discharge Summary (Signed)
  NAMEALFONSO, [name] NO.:  [id number]  MEDICAL RECORD NO.:  [id number]  LOCATION:  3739                         FACILITY:  [facility]  PHYSICIAN:  Sundra Aland, MD      DATE OF BIRTH:  09/05/1933  DATE OF ADMISSION:  04/05/2011 DATE OF DISCHARGE:  04/10/2011                              DISCHARGE SUMMARY   ADDENDUM  The patient's discharge was held yesterday  because he sustained a skin tear on the right cubital fossa  area.  Wound care has been consulted and this morning  they were able to see the patient.  They recommended to clean the area with normal saline and apply Vaseline gauze to skin tear with 4x4 and change the dressing 3 times a week until healed.  The patient and the wife have been put through that.  The vital signs this morning are also  stable.  Blood pressure is 110/66, heart rate is 79, respirations 18, temperature 98.6, saturation 92% on room air .  He will therefore be discharged  in stable clinical condition.  Discharge medications will remain the same as per discharge summary.     Sundra Aland, MD     LA/MEDQ  D:  04/10/2011  T:  04/11/2011  Job:  [id number]  Electronically Signed by Sundra Aland MD on 04/11/2011 03:17:59 PM

## 2011-07-08 LAB — CARDIAC PANEL(CRET KIN+CKTOT+MB+TROPI)
CK, MB: 1.9 ng/mL (ref 0.3–4.0)
CK, MB: 2 ng/mL (ref 0.3–4.0)
CK, MB: 2.2 ng/mL (ref 0.3–4.0)
Relative Index: INVALID (ref 0.0–2.5)
Relative Index: INVALID (ref 0.0–2.5)
Total CK: 53 U/L (ref 7–232)
Total CK: 57 U/L (ref 7–232)

## 2011-07-08 LAB — CBC
HCT: 45.4 % (ref 39.0–52.0)
Hemoglobin: 15.2 g/dL (ref 13.0–17.0)
Hemoglobin: 16.7 g/dL (ref 13.0–17.0)
RBC: 4.7 MIL/uL (ref 4.22–5.81)
RBC: 5.27 MIL/uL (ref 4.22–5.81)
RDW: 15.4 % (ref 11.5–15.5)
WBC: 15 10*3/uL — ABNORMAL HIGH (ref 4.0–10.5)

## 2011-07-08 LAB — LIPID PANEL
Cholesterol: 147 mg/dL (ref 0–200)
HDL: 70 mg/dL (ref >39)
LDL Cholesterol: 68 mg/dL (ref 0–99)
Total CHOL/HDL Ratio: 2.1 RATIO
Triglycerides: 46 mg/dL (ref <150)

## 2011-07-08 LAB — PROTIME-INR
INR: 2.4 — ABNORMAL HIGH (ref 0.00–1.49)
Prothrombin Time: 28.1 seconds — ABNORMAL HIGH (ref 11.6–15.2)

## 2011-07-08 LAB — POCT I-STAT, CHEM 8
Calcium, Ion: 1.13 mmol/L (ref 1.12–1.32)
Chloride: 98 mEq/L (ref 96–112)
Glucose, Bld: 110 mg/dL — ABNORMAL HIGH (ref 70–99)
HCT: 56 % — ABNORMAL HIGH (ref 39.0–52.0)
Hemoglobin: 19 g/dL — ABNORMAL HIGH (ref 13.0–17.0)

## 2011-07-08 LAB — CULTURE, BLOOD (ROUTINE X 2): Culture: NO GROWTH

## 2011-07-08 LAB — DIFFERENTIAL
Basophils Absolute: 0 10*3/uL (ref 0.0–0.1)
Basophils Relative: 0 % (ref 0–1)
Lymphocytes Relative: 4 % — ABNORMAL LOW (ref 12–46)
Monocytes Absolute: 0.7 10*3/uL (ref 0.1–1.0)
Monocytes Relative: 5 % (ref 3–12)
Neutro Abs: 11.3 10*3/uL — ABNORMAL HIGH (ref 1.7–7.7)
Neutrophils Relative %: 91 % — ABNORMAL HIGH (ref 43–77)

## 2011-07-08 LAB — BASIC METABOLIC PANEL
BUN: 17 mg/dL (ref 6–23)
GFR calc non Af Amer: >60 mL/min (ref >60)
Glucose, Bld: 129 mg/dL — ABNORMAL HIGH (ref 70–99)
Potassium: 3.7 mEq/L (ref 3.5–5.1)

## 2011-07-08 LAB — D-DIMER, QUANTITATIVE: D-Dimer, Quant: 0.31 ug/mL-FEU (ref 0.00–0.48)

## 2011-07-08 LAB — HEMOGLOBIN A1C
Hgb A1c MFr Bld: 5.8 % (ref 4.6–6.1)
Mean Plasma Glucose: 120 mg/dL

## 2011-07-08 LAB — GLUCOSE, CAPILLARY
Glucose-Capillary: 146 mg/dL — ABNORMAL HIGH (ref 70–99)
Glucose-Capillary: 146 mg/dL — ABNORMAL HIGH (ref 70–99)

## 2011-07-08 LAB — POCT CARDIAC MARKERS: CKMB, poc: 1.2 ng/mL (ref 1.0–8.0)

## 2011-07-08 LAB — TSH: TSH: 0.424 u[IU]/mL (ref 0.350–4.500)

## 2011-07-11 LAB — DIC (DISSEMINATED INTRAVASCULAR COAGULATION)PANEL
D-Dimer, Quant: 0.34 ug/mL-FEU (ref 0.00–0.48)
INR: 1.9 — ABNORMAL HIGH (ref 0.00–1.49)
Prothrombin Time: 22.7 seconds — ABNORMAL HIGH (ref 11.6–15.2)
Smear Review: NONE SEEN
aPTT: 28 seconds (ref 24–37)

## 2011-07-11 LAB — DIFFERENTIAL
Basophils Absolute: 0 10*3/uL (ref 0.0–0.1)
Basophils Relative: 0 % (ref 0–1)
Eosinophils Relative: 0 % (ref 0–5)
Lymphocytes Relative: 5 % — ABNORMAL LOW (ref 12–46)
Neutro Abs: 10.7 10*3/uL — ABNORMAL HIGH (ref 1.7–7.7)

## 2011-07-11 LAB — CBC
HCT: 47.6 % (ref 39.0–52.0)
MCHC: 32.5 g/dL (ref 30.0–36.0)
MCHC: 32.6 g/dL (ref 30.0–36.0)
MCHC: 32.9 g/dL (ref 30.0–36.0)
MCV: 97 fL (ref 78.0–100.0)
MCV: 98.4 fL (ref 78.0–100.0)
Platelets: 110 10*3/uL — ABNORMAL LOW (ref 150–400)
Platelets: 90 10*3/uL — ABNORMAL LOW (ref 150–400)
RBC: 4.97 MIL/uL (ref 4.22–5.81)
RDW: 15.6 % — ABNORMAL HIGH (ref 11.5–15.5)
RDW: 15.8 % — ABNORMAL HIGH (ref 11.5–15.5)
RDW: 16 % — ABNORMAL HIGH (ref 11.5–15.5)
WBC: 9.8 10*3/uL (ref 4.0–10.5)

## 2011-07-11 LAB — GLUCOSE, CAPILLARY
Glucose-Capillary: 116 mg/dL — ABNORMAL HIGH (ref 70–99)
Glucose-Capillary: 132 mg/dL — ABNORMAL HIGH (ref 70–99)
Glucose-Capillary: 134 mg/dL — ABNORMAL HIGH (ref 70–99)
Glucose-Capillary: 139 mg/dL — ABNORMAL HIGH (ref 70–99)
Glucose-Capillary: 151 mg/dL — ABNORMAL HIGH (ref 70–99)
Glucose-Capillary: 158 mg/dL — ABNORMAL HIGH (ref 70–99)

## 2011-07-11 LAB — HEPARIN INDUCED THROMBOCYTOPENIA PNL
Heparin Induced Plt Ab: NEGATIVE
Heparin Induced Plt Ab: NEGATIVE
Patient O.D.: 0.175

## 2011-07-11 LAB — PROTIME-INR
INR: 1.7 — ABNORMAL HIGH (ref 0.00–1.49)
INR: 1.8 — ABNORMAL HIGH (ref 0.00–1.49)
INR: 1.8 — ABNORMAL HIGH (ref 0.00–1.49)
Prothrombin Time: 20.9 seconds — ABNORMAL HIGH (ref 11.6–15.2)
Prothrombin Time: 21.4 seconds — ABNORMAL HIGH (ref 11.6–15.2)
Prothrombin Time: 22.3 seconds — ABNORMAL HIGH (ref 11.6–15.2)

## 2011-07-11 LAB — BASIC METABOLIC PANEL
BUN: 18 mg/dL (ref 6–23)
Chloride: 94 mEq/L — ABNORMAL LOW (ref 96–112)
Creatinine, Ser: 1.03 mg/dL (ref 0.4–1.5)
GFR calc Af Amer: >60 mL/min (ref >60)
GFR calc non Af Amer: >60 mL/min (ref >60)

## 2011-07-11 LAB — HEPARIN ANTIBODY SCREEN
Heparin Antibody Screen: POSITIVE
Heparin Antibody Screen: POSITIVE

## 2011-07-15 LAB — CBC
Hemoglobin: 16.9
MCHC: 33.5
MCV: 96.4
RBC: 5.25
RDW: 15 — ABNORMAL HIGH

## 2011-07-15 LAB — DIFFERENTIAL
Basophils Absolute: 0.1
Basophils Relative: 1
Eosinophils Absolute: 0.2
Monocytes Absolute: 1 — ABNORMAL HIGH
Monocytes Relative: 11
Neutrophils Relative %: 58

## 2012-03-19 ENCOUNTER — Encounter: Payer: Self-pay | Admitting: Cardiology

## 2012-07-14 ENCOUNTER — Encounter: Payer: Self-pay | Admitting: *Deleted

## 2012-12-09 ENCOUNTER — Encounter: Payer: Self-pay | Admitting: *Deleted

## 2014-11-25 ENCOUNTER — Encounter (HOSPITAL_COMMUNITY): Payer: Self-pay | Admitting: Family Medicine

## 2014-11-25 ENCOUNTER — Emergency Department (HOSPITAL_COMMUNITY): Payer: Medicare Other

## 2014-11-25 ENCOUNTER — Inpatient Hospital Stay (HOSPITAL_COMMUNITY)
Admission: EM | Admit: 2014-11-25 | Discharge: 2014-12-01 | DRG: 853 | Disposition: A | Discharge status: Home / Self Care | Payer: Medicare Other | Attending: Internal Medicine | Admitting: Internal Medicine

## 2014-11-25 DIAGNOSIS — E860 Dehydration: Secondary | ICD-10-CM | POA: Diagnosis present

## 2014-11-25 DIAGNOSIS — K209 Esophagitis, unspecified without bleeding: Secondary | ICD-10-CM | POA: Insufficient documentation

## 2014-11-25 DIAGNOSIS — N179 Acute kidney failure, unspecified: Secondary | ICD-10-CM | POA: Diagnosis present

## 2014-11-25 DIAGNOSIS — A419 Sepsis, unspecified organism: Secondary | ICD-10-CM | POA: Diagnosis present

## 2014-11-25 DIAGNOSIS — J441 Chronic obstructive pulmonary disease with (acute) exacerbation: Secondary | ICD-10-CM | POA: Diagnosis present

## 2014-11-25 DIAGNOSIS — Z7951 Long term (current) use of inhaled steroids: Secondary | ICD-10-CM

## 2014-11-25 DIAGNOSIS — I2510 Atherosclerotic heart disease of native coronary artery without angina pectoris: Secondary | ICD-10-CM | POA: Diagnosis present

## 2014-11-25 DIAGNOSIS — K210 Gastro-esophageal reflux disease with esophagitis: Secondary | ICD-10-CM | POA: Diagnosis present

## 2014-11-25 DIAGNOSIS — R0600 Dyspnea, unspecified: Secondary | ICD-10-CM

## 2014-11-25 DIAGNOSIS — J9621 Acute and chronic respiratory failure with hypoxia: Secondary | ICD-10-CM | POA: Diagnosis present

## 2014-11-25 DIAGNOSIS — J449 Chronic obstructive pulmonary disease, unspecified: Secondary | ICD-10-CM | POA: Diagnosis not present

## 2014-11-25 DIAGNOSIS — Z91199 Patient's noncompliance with other medical treatment and regimen due to unspecified reason: Secondary | ICD-10-CM

## 2014-11-25 DIAGNOSIS — I4891 Unspecified atrial fibrillation: Secondary | ICD-10-CM | POA: Diagnosis not present

## 2014-11-25 DIAGNOSIS — I10 Essential (primary) hypertension: Secondary | ICD-10-CM | POA: Diagnosis present

## 2014-11-25 DIAGNOSIS — Z87891 Personal history of nicotine dependence: Secondary | ICD-10-CM | POA: Diagnosis not present

## 2014-11-25 DIAGNOSIS — I482 Chronic atrial fibrillation, unspecified: Secondary | ICD-10-CM | POA: Diagnosis present

## 2014-11-25 DIAGNOSIS — Z9119 Patient's noncompliance with other medical treatment and regimen: Secondary | ICD-10-CM

## 2014-11-25 DIAGNOSIS — Z79899 Other long term (current) drug therapy: Secondary | ICD-10-CM

## 2014-11-25 DIAGNOSIS — C3411 Malignant neoplasm of upper lobe, right bronchus or lung: Secondary | ICD-10-CM | POA: Diagnosis present

## 2014-11-25 DIAGNOSIS — C3490 Malignant neoplasm of unspecified part of unspecified bronchus or lung: Secondary | ICD-10-CM | POA: Insufficient documentation

## 2014-11-25 DIAGNOSIS — J189 Pneumonia, unspecified organism: Secondary | ICD-10-CM | POA: Diagnosis present

## 2014-11-25 DIAGNOSIS — R9389 Abnormal findings on diagnostic imaging of other specified body structures: Secondary | ICD-10-CM | POA: Diagnosis present

## 2014-11-25 DIAGNOSIS — R1310 Dysphagia, unspecified: Secondary | ICD-10-CM | POA: Diagnosis present

## 2014-11-25 DIAGNOSIS — H3530 Unspecified macular degeneration: Secondary | ICD-10-CM | POA: Diagnosis present

## 2014-11-25 DIAGNOSIS — Z7901 Long term (current) use of anticoagulants: Secondary | ICD-10-CM

## 2014-11-25 DIAGNOSIS — R938 Abnormal findings on diagnostic imaging of other specified body structures: Secondary | ICD-10-CM | POA: Diagnosis not present

## 2014-11-25 HISTORY — DX: Calculus of kidney: N20.0

## 2014-11-25 HISTORY — DX: Personal history of nicotine dependence: Z87.891

## 2014-11-25 HISTORY — DX: Chronic obstructive pulmonary disease, unspecified: J44.9

## 2014-11-25 HISTORY — DX: Atherosclerotic heart disease of native coronary artery without angina pectoris: I25.10

## 2014-11-25 LAB — BASIC METABOLIC PANEL
Anion gap: 9 (ref 5–15)
BUN: 21 mg/dL (ref 6–23)
CHLORIDE: 100 mmol/L (ref 96–112)
CO2: 25 mmol/L (ref 19–32)
CREATININE: 1.37 mg/dL — AB (ref 0.50–1.35)
Calcium: 8.9 mg/dL (ref 8.4–10.5)
GFR calc Af Amer: 55 mL/min — ABNORMAL LOW (ref >90)
GFR calc non Af Amer: 47 mL/min — ABNORMAL LOW (ref >90)
GLUCOSE: 112 mg/dL — AB (ref 70–99)
POTASSIUM: 3.7 mmol/L (ref 3.5–5.1)
SODIUM: 134 mmol/L — AB (ref 135–145)

## 2014-11-25 LAB — BRAIN NATRIURETIC PEPTIDE: B Natriuretic Peptide: 119.9 pg/mL — ABNORMAL HIGH (ref 0.0–100.0)

## 2014-11-25 LAB — CBC
HEMATOCRIT: 42.1 % (ref 39.0–52.0)
HEMOGLOBIN: 13.6 g/dL (ref 13.0–17.0)
MCH: 26.6 pg (ref 26.0–34.0)
MCHC: 32.3 g/dL (ref 30.0–36.0)
MCV: 82.2 fL (ref 78.0–100.0)
PLATELETS: 281 10*3/uL (ref 150–400)
RBC: 5.12 MIL/uL (ref 4.22–5.81)
RDW: 14.2 % (ref 11.5–15.5)
WBC: 14.4 10*3/uL — ABNORMAL HIGH (ref 4.0–10.5)

## 2014-11-25 LAB — EXPECTORATED SPUTUM ASSESSMENT W REFEX TO RESP CULTURE

## 2014-11-25 LAB — PROTIME-INR
INR: 1.13 (ref 0.00–1.49)
Prothrombin Time: 14.6 seconds (ref 11.6–15.2)

## 2014-11-25 LAB — STREP PNEUMONIAE URINARY ANTIGEN: STREP PNEUMO URINARY ANTIGEN: NEGATIVE

## 2014-11-25 LAB — I-STAT TROPONIN, ED: Troponin i, poc: 0 ng/mL (ref 0.00–0.08)

## 2014-11-25 LAB — MRSA PCR SCREENING: MRSA BY PCR: NEGATIVE

## 2014-11-25 LAB — EXPECTORATED SPUTUM ASSESSMENT W GRAM STAIN, RFLX TO RESP C

## 2014-11-25 LAB — DIGOXIN LEVEL: Digoxin Level: <0.2 ng/mL — ABNORMAL LOW (ref 0.8–2.0)

## 2014-11-25 MED ORDER — LEVOFLOXACIN IN D5W 750 MG/150ML IV SOLN: 750.0000 mg | INTRAVENOUS | Status: DC

## 2014-11-25 MED ORDER — DILTIAZEM HCL 100 MG IV SOLR
5.0000 mg/h | INTRAVENOUS | Status: DC
  Administered 2014-11-25 – 2014-11-26 (×3): 10 mg/h via INTRAVENOUS
  Administered 2014-11-26: 5 mg/h via INTRAVENOUS
  Administered 2014-11-27 (×2): 10 mg/h via INTRAVENOUS
  Filled 2014-11-25 (×5): qty 100

## 2014-11-25 MED ORDER — ACETAMINOPHEN 325 MG PO TABS: 650.0000 mg | ORAL_TABLET | Freq: Four times a day (QID) | ORAL | Status: DC | PRN

## 2014-11-25 MED ORDER — NITROGLYCERIN 0.4 MG SL SUBL
SUBLINGUAL_TABLET | SUBLINGUAL | Status: AC
  Administered 2014-11-25: 0.4 mg
  Filled 2014-11-25: qty 1

## 2014-11-25 MED ORDER — DILTIAZEM HCL 25 MG/5ML IV SOLN: 15.0000 mg | Freq: Once | INTRAVENOUS | Status: DC

## 2014-11-25 MED ORDER — DILTIAZEM HCL 100 MG IV SOLR
5.0000 mg/h | INTRAVENOUS | Status: DC
  Administered 2014-11-25: 5 mg/h via INTRAVENOUS

## 2014-11-25 MED ORDER — ALBUTEROL SULFATE HFA 108 (90 BASE) MCG/ACT IN AERS: 1.0000 | INHALATION_SPRAY | Freq: Four times a day (QID) | RESPIRATORY_TRACT | Status: DC | PRN

## 2014-11-25 MED ORDER — MORPHINE SULFATE 2 MG/ML IJ SOLN
1.0000 mg | INTRAMUSCULAR | Status: DC | PRN
  Administered 2014-11-25: 2 mg via INTRAVENOUS
  Filled 2014-11-25 (×2): qty 1

## 2014-11-25 MED ORDER — LEVOFLOXACIN IN D5W 750 MG/150ML IV SOLN
750.0000 mg | Freq: Once | INTRAVENOUS | Status: AC
  Administered 2014-11-25: 750 mg via INTRAVENOUS
  Filled 2014-11-25: qty 150

## 2014-11-25 MED ORDER — ALBUTEROL SULFATE (2.5 MG/3ML) 0.083% IN NEBU
2.5000 mg | INHALATION_SOLUTION | Freq: Four times a day (QID) | RESPIRATORY_TRACT | Status: DC | PRN
Start: 2014-11-25 — End: 2014-12-01
  Administered 2014-11-26 – 2014-11-28 (×3): 2.5 mg via RESPIRATORY_TRACT
  Filled 2014-11-25 (×3): qty 3

## 2014-11-25 MED ORDER — SODIUM CHLORIDE 0.9 % IV SOLN
INTRAVENOUS | Status: DC
  Administered 2014-11-25 – 2014-11-28 (×6): via INTRAVENOUS
  Administered 2014-11-28: 50 mL/h via INTRAVENOUS
  Administered 2014-11-28: 06:00:00 via INTRAVENOUS
  Administered 2014-11-29 (×2): 50 mL/h via INTRAVENOUS
  Administered 2014-11-30: 18:00:00 via INTRAVENOUS

## 2014-11-25 MED ORDER — ONDANSETRON HCL 4 MG/2ML IJ SOLN: 4.0000 mg | Freq: Four times a day (QID) | INTRAMUSCULAR | Status: DC | PRN

## 2014-11-25 MED ORDER — ONDANSETRON HCL 4 MG PO TABS: 4.0000 mg | ORAL_TABLET | Freq: Four times a day (QID) | ORAL | Status: DC | PRN

## 2014-11-25 MED ORDER — RIVAROXABAN 20 MG PO TABS
20.0000 mg | ORAL_TABLET | Freq: Every day | ORAL | Status: DC
  Administered 2014-11-25: 20 mg via ORAL
  Filled 2014-11-25 (×2): qty 1

## 2014-11-25 MED ORDER — OXYCODONE HCL 5 MG PO TABS: 5.0000 mg | ORAL_TABLET | ORAL | Status: DC | PRN

## 2014-11-25 MED ORDER — PNEUMOCOCCAL VAC POLYVALENT 25 MCG/0.5ML IJ INJ
0.5000 mL | INJECTION | INTRAMUSCULAR | Status: DC
  Filled 2014-11-25: qty 0.5

## 2014-11-25 MED ORDER — SODIUM CHLORIDE 0.9 % IV BOLUS (SEPSIS)
250.0000 mL | Freq: Once | INTRAVENOUS | Status: AC
  Administered 2014-11-25: 250 mL via INTRAVENOUS

## 2014-11-25 MED ORDER — ACETAMINOPHEN 650 MG RE SUPP: 650.0000 mg | Freq: Four times a day (QID) | RECTAL | Status: DC | PRN

## 2014-11-25 MED ORDER — FAMOTIDINE 20 MG PO TABS
20.0000 mg | ORAL_TABLET | Freq: Every day | ORAL | Status: DC | PRN
  Filled 2014-11-25 (×2): qty 1

## 2014-11-25 MED ORDER — POLYVINYL ALCOHOL 1.4 % OP SOLN
1.0000 [drp] | OPHTHALMIC | Status: DC | PRN
  Administered 2014-11-30: 1 [drp] via OPHTHALMIC
  Filled 2014-11-25: qty 15

## 2014-11-25 MED ORDER — LEVOFLOXACIN IN D5W 750 MG/150ML IV SOLN
750.0000 mg | INTRAVENOUS | Status: DC
  Filled 2014-11-25: qty 150

## 2014-11-25 MED ORDER — SODIUM CHLORIDE 0.9 % IJ SOLN
3.0000 mL | Freq: Two times a day (BID) | INTRAMUSCULAR | Status: DC
  Administered 2014-11-25 – 2014-11-30 (×7): 3 mL via INTRAVENOUS

## 2014-11-25 MED ORDER — ZOLPIDEM TARTRATE 5 MG PO TABS
5.0000 mg | ORAL_TABLET | Freq: Every evening | ORAL | Status: DC | PRN
  Administered 2014-11-25 – 2014-11-30 (×6): 5 mg via ORAL
  Filled 2014-11-25 (×6): qty 1

## 2014-11-25 MED ORDER — DOCUSATE SODIUM 100 MG PO CAPS
100.0000 mg | ORAL_CAPSULE | Freq: Two times a day (BID) | ORAL | Status: DC
  Administered 2014-11-28 – 2014-12-01 (×6): 100 mg via ORAL
  Filled 2014-11-25 (×14): qty 1

## 2014-11-25 MED ORDER — BUDESONIDE-FORMOTEROL FUMARATE 160-4.5 MCG/ACT IN AERO
2.0000 | INHALATION_SPRAY | Freq: Two times a day (BID) | RESPIRATORY_TRACT | Status: DC
  Administered 2014-11-25 – 2014-11-30 (×11): 2 via RESPIRATORY_TRACT
  Filled 2014-11-25 (×2): qty 6

## 2014-11-25 MED ORDER — ALUM & MAG HYDROXIDE-SIMETH 200-200-20 MG/5ML PO SUSP
30.0000 mL | Freq: Four times a day (QID) | ORAL | Status: DC | PRN
  Administered 2014-11-26 – 2014-11-30 (×3): 30 mL via ORAL
  Filled 2014-11-25 (×3): qty 30

## 2014-11-25 MED ORDER — DILTIAZEM LOAD VIA INFUSION
10.0000 mg | Freq: Once | INTRAVENOUS | Status: AC
  Administered 2014-11-25: 10 mg via INTRAVENOUS
  Filled 2014-11-25: qty 10

## 2014-11-25 NOTE — ED Notes (Signed)
Pt sent here from [facility] with SOB in afib RVR. sts some intermittent right side chest pain. sts started about 3 am.

## 2014-11-25 NOTE — ED Notes (Signed)
Pt wheeled back to room. Pt placed into gown and on monitor upon arrival to room. Pt monitored by 12 lead, blood pressure, and pulse ox. Pts EKG given to and signed by Dr. Thurnell Garbe

## 2014-11-25 NOTE — Progress Notes (Signed)
Pt pain free on arrival to unit. C/o chest pain when allowed to dangle    at bedside to eat meal. Will continue to monitor.

## 2014-11-25 NOTE — ED Provider Notes (Signed)
CSN: [id number]     Arrival date & time 11/25/14  1158 History   First MD Initiated Contact with Patient 11/25/14 1209     Chief Complaint  Patient presents with  . Shortness of Breath      HPI Pt was seen at 1210. Per pt, c/o gradual onset and persistence of constant SOB and right sided chest "pain" that began at 0300 this morning. Has been associated with coughing. Pt was evaluated by his PMD PTA, had an EKG performed that revealed afib/RVR with rates in 130's, then pt was sent to the ED for further evaluation. Pt denies palpitations, no back pain, no N/V/D, no abd pain, no fevers. Denies any recent change in meds.     Past Medical History  Diagnosis Date  . Syncope   . COPD (chronic obstructive pulmonary disease)   . Chronic atrial fibrillation   . GERD (gastroesophageal reflux disease)   . Coronary artery disease   . Hypertension   . Kidney stone    Past Surgical History  Procedure Laterality Date  . Appendectomy    . Cholecystectomy    . Esophageal dilation     Family History  Problem Relation Age of Onset  . Stroke    . Cancer    . Heart attack Sister    History  Substance Use Topics  . Smoking status: Former Smoker -- 0.00 packs/day for 65 years  . Smokeless tobacco: Not on file  . Alcohol Use: Yes     Comment: 2-3 every night    Review of Systems ROS: Statement: All systems negative except as marked or noted in the HPI; Constitutional: Negative for fever and chills. ; ; Eyes: Negative for eye pain, redness and discharge. ; ; ENMT: Negative for ear pain, hoarseness, nasal congestion, sinus pressure and sore throat. ; ; Cardiovascular: +CP, SOB. Negative for palpitations, diaphoresis, and peripheral edema. ; ; Respiratory: +cough. Negative for wheezing and stridor. ; ; Gastrointestinal: Negative for nausea, vomiting, diarrhea, abdominal pain, blood in stool, hematemesis, jaundice and rectal bleeding. . ; ; Genitourinary: Negative for dysuria, flank pain and  hematuria. ; ; Musculoskeletal: Negative for back pain and neck pain. Negative for swelling and trauma.; ; Skin: Negative for pruritus, rash, abrasions, blisters, bruising and skin lesion.; ; Neuro: Negative for headache, lightheadedness and neck stiffness. Negative for weakness, altered level of consciousness , altered mental status, extremity weakness, paresthesias, involuntary movement, seizure and syncope.      Allergies  Codeine  Home Medications   Prior to Admission medications   Medication Sig Start Date End Date Taking? Authorizing Provider  albuterol (PROVENTIL HFA;VENTOLIN HFA) 108 (90 BASE) MCG/ACT inhaler Inhale 1-2 puffs into the lungs every 6 (six) hours as needed for wheezing or shortness of breath.   Yes Historical Provider, MD  budesonide-formoterol (SYMBICORT) 160-4.5 MCG/ACT inhaler Inhale 2 puffs into the lungs 2 (two) times daily.   Yes Historical Provider, MD  diltiazem (CARDIZEM) 90 MG tablet Take 90 mg by mouth 2 (two) times daily. Take two tablets in the morning and take one tablet in the afternoon.   Yes Historical Provider, MD  diphenhydramine-acetaminophen (TYLENOL PM) 25-500 MG TABS Take 2 tablets by mouth at bedtime as needed.   Yes Historical Provider, MD  famotidine (PEPCID) 20 MG tablet Take 20 mg by mouth daily as needed for heartburn or indigestion.   Yes Historical Provider, MD  furosemide (LASIX) 40 MG tablet Take 40 mg by mouth daily.   Yes Historical Provider,  MD  rivaroxaban (XARELTO) 20 MG TABS tablet Take 20 mg by mouth daily with supper.   Yes Historical Provider, MD  zolpidem (AMBIEN) 10 MG tablet Take 5 mg by mouth at bedtime as needed for sleep.   Yes Historical Provider, MD   BP 121/55 mmHg  Pulse 103  Temp(Src) 98.1 F (36.7 C)  Resp 27  Wt 196 lb (88.905 kg)  SpO2 99%    Filed Vitals:   11/25/14 1330 11/25/14 1400 11/25/14 1430 11/25/14 1515  BP: 95/72 121/55 119/76 110/68  Pulse: 104 103 100 109  Temp:      Resp: 25 27 23 23    Weight:      SpO2: 98% 99% 97% 99%    Physical Exam  1215; Physical examination:  Nursing notes reviewed; Vital signs and O2 SAT reviewed;  Constitutional: Well developed, Well nourished, Well hydrated, Uncomfortable appearing.; Head:  Normocephalic, atraumatic; Eyes: EOMI, PERRL, No scleral icterus; ENMT: Mouth and pharynx normal, Mucous membranes moist; Neck: Supple, Full range of motion, No lymphadenopathy; Cardiovascular: Tachycardic rate and irregular irregular rhythm, No gallop; Respiratory: Breath sounds coarse & equal bilaterally, No wheezes.  Speaking full sentences with ease, Normal respiratory effort/excursion; Chest: Nontender, Movement normal; Abdomen: Soft, Nontender, Nondistended, Normal bowel sounds; Genitourinary: No CVA tenderness; Extremities: Pulses normal, No tenderness, No edema, No calf edema or asymmetry.; Neuro: AA&Ox3, Major CN grossly intact.  Speech clear. No gross focal motor or sensory deficits in extremities.; Skin: Color normal, Warm, Dry.   ED Course  Procedures      EKG Interpretation None      MDM  MDM Reviewed: previous chart, nursing note and vitals Reviewed previous: labs and ECG Interpretation: labs, ECG and x-ray Total time providing critical care: 30-74 minutes. This excludes time spent performing separately reportable procedures and services. Consults: cardiology and admitting MD     CRITICAL CARE Performed by: Alfonzo Feller Total critical care time: 35 Critical care time was exclusive of separately billable procedures and treating other patients. Critical care was necessary to treat or prevent imminent or life-threatening deterioration. Critical care was time spent personally by me on the following activities: development of treatment plan with patient and/or surrogate as well as nursing, discussions with consultants, evaluation of patient's response to treatment, examination of patient, obtaining history from patient or surrogate,  ordering and performing treatments and interventions, ordering and review of laboratory studies, ordering and review of radiographic studies, pulse oximetry and re-evaluation of patient's condition.   Date: 11/25/2014  Rate: 130  Rhythm: atrial fibrillation  QRS Axis: left  Intervals: normal  ST/T Wave abnormalities: nonspecific ST/T changes, artifact  Conduction Disutrbances:none  Narrative Interpretation:   Old EKG Reviewed: unchanged; no significant changes from previous EKG dated 04/05/2011.   Results for orders placed or performed during the hospital encounter of 11/25/14  CBC  Result Value Ref Range   WBC 14.4 (H) 4.0 - 10.5 K/uL   RBC 5.12 4.22 - 5.81 MIL/uL   Hemoglobin 13.6 13.0 - 17.0 g/dL   HCT 42.1 39.0 - 52.0 %   MCV 82.2 78.0 - 100.0 fL   MCH 26.6 26.0 - 34.0 pg   MCHC 32.3 30.0 - 36.0 g/dL   RDW 14.2 11.5 - 15.5 %   Platelets 281 150 - 400 K/uL  Basic metabolic panel  Result Value Ref Range   Sodium 134 (L) 135 - 145 mmol/L   Potassium 3.7 3.5 - 5.1 mmol/L   Chloride 100 96 - 112 mmol/L  CO2 25 19 - 32 mmol/L   Glucose, Bld 112 (H) 70 - 99 mg/dL   BUN 21 6 - 23 mg/dL   Creatinine, Ser 1.37 (H) 0.50 - 1.35 mg/dL   Calcium 8.9 8.4 - 10.5 mg/dL   GFR calc non Af Amer 47 (L) >90 mL/min   GFR calc Af Amer 55 (L) >90 mL/min   Anion gap 9 5 - 15  BNP (order ONLY if patient complains of dyspnea/SOB AND you have documented it for THIS visit)  Result Value Ref Range   B Natriuretic Peptide 119.9 (H) 0.0 - 100.0 pg/mL  Protime-INR (if pt is taking Coumadin)  Result Value Ref Range   Prothrombin Time 14.6 11.6 - 15.2 seconds   INR 1.13 0.00 - 1.49  Digoxin level  Result Value Ref Range   Digoxin Level <0.2 (L) 0.8 - 2.0 ng/mL  I-stat troponin, ED (not at [facility])  Result Value Ref Range   Troponin i, poc 0.00 0.00 - 0.08 ng/mL   Comment 3           Dg Chest Port 1 View 11/25/2014   CLINICAL DATA:  Cough, shortness of breath and RIGHT-sided chest pain since 0300  hours today, history hypertension, COPD, coronary artery disease, atrial fibrillation, former smoker  EXAM: PORTABLE CHEST - 1 VIEW  COMPARISON:  04/05/2011; correlation CT chest 04/06/2011  FINDINGS: Normal heart size and pulmonary vascularity.  Enlargement of RIGHT hilum with questionable adenopathy versus suprahilar/perihilar mass.  Infiltrate identified in the RIGHT perihilar region and RIGHT lower lobe, unable to exclude underlying mass lesion.  Remaining lungs emphysematous but clear.  No pleural effusion or pneumothorax.  IMPRESSION: COPD changes with RIGHT perihilar and lower lobe infiltrate infiltrate with RIGHT hilar enlargement suspicious for adenopathy or tumor.  Further evaluation by CT chest with contrast recommended to assess for potential malignancy.   Electronically Signed   By: Lavonia Dana M.D.   On: 11/25/2014 13:26    1355:  On arrival to the ED: pt's monitor afib/RVR with rates 130-140's. IV cardizem bolus and gtt given with HR improving to 120's. IV cardizem gtt increased with HR slowly decreasing to 100's.  Will tx with IV abx for CAP. Dx and testing d/w pt and family.  Questions answered.  Verb understanding, agreeable to admit.   T/C to Cards Dr. Haroldine Laws:  case discussed, including:  HPI, pertinent PM/SHx, VS/PE, dx testing, ED course and treatment:  Requests to admit to medicine service. T/C to [facility] Ebony Hail, case discussed, including:  HPI, pertinent PM/SHx, VS/PE, dx testing, ED course and treatment:  Agreeable to admit, requests she will come to the ED for evaluation.   Francine Graven, DO 11/28/14 [phone]

## 2014-11-25 NOTE — Progress Notes (Signed)
ANTIBIOTIC CONSULT NOTE - INITIAL  Pharmacy Consult for levaquin Indication: pneumonia  Allergies  Allergen Reactions  . Codeine Itching    Patient Measurements: Height: 5\' 10"  (177.8 cm) Weight: 175 lb 7.8 oz (79.6 kg) IBW/kg (Calculated) : 73 Adjusted Body Weight:   Vital Signs: Temp: 98.6 F (37 C) (02/20 1601) Temp Source: Oral (02/20 1601) BP: 104/62 mmHg (02/20 1601) Pulse Rate: 101 (02/20 1601) Intake/Output from previous day:   Intake/Output from this shift: Total I/O In: 180 [I.V.:30; IV Piggyback:150] Out: 250 [Urine:250]  Labs:  Recent Labs  11/25/14 1220  WBC 14.4*  HGB 13.6  PLT 281  CREATININE 1.37*   Estimated Creatinine Clearance: 45.1 mL/min (by C-G formula based on Cr of 1.37). No results for input(s): VANCOTROUGH, VANCOPEAK, VANCORANDOM, GENTTROUGH, GENTPEAK, GENTRANDOM, TOBRATROUGH, TOBRAPEAK, TOBRARND, AMIKACINPEAK, AMIKACINTROU, AMIKACIN in the last 72 hours.   Microbiology: No results found for this or any previous visit (from the past 720 hour(s)).  Medical History: Past Medical History  Diagnosis Date  . Syncope   . COPD (chronic obstructive pulmonary disease)   . Chronic atrial fibrillation   . GERD (gastroesophageal reflux disease)   . Coronary artery disease   . Hypertension   . Kidney stone     Medications:  Scheduled:  . budesonide-formoterol  2 puff Inhalation BID  . diltiazem  15 mg Intravenous Once  . docusate sodium  100 mg Oral BID  . levofloxacin (LEVAQUIN) IV  750 mg Intravenous Once  . [START ON 11/27/2014] levofloxacin (LEVAQUIN) IV  750 mg Intravenous Q48H  . rivaroxaban  20 mg Oral Q supper  . sodium chloride  3 mL Intravenous Q12H   Infusions:  . sodium chloride    . diltiazem (CARDIZEM) infusion 10 mg/hr (11/25/14 1331)  . diltiazem (CARDIZEM) infusion     Assessment: 81 yo male with pneumonia will be adjusted renally on his levaquin.  CrCl ~45.  Patient received 750 mg iv x1 at 1515 on 11/25/14  Goal  of Therapy:  Resolution of infection  Plan:  - Change levaquin to 750 mg iv q48h, next dose at 1500 on 11/27/14. - monitor renal function  Marquis Down, Tsz-Yin 11/25/2014,4:22 PM

## 2014-11-25 NOTE — H&P (Signed)
[facility] History and Physical                                                                                    [name], is a 81 y.o. male  MRN: [id number]   DOB - [birth year]  Admit Date - 11/25/2014  Outpatient Primary MD for the patient is [name], MD  With History of -  Past Medical History  Diagnosis Date  . Syncope   . COPD (chronic obstructive pulmonary disease)   . Chronic atrial fibrillation   . GERD (gastroesophageal reflux disease)   . Coronary artery disease   . Hypertension   . Kidney stone       Past Surgical History  Procedure Laterality Date  . Appendectomy    . Cholecystectomy    . Esophageal dilation      in for   Chief Complaint  Patient presents with  . Shortness of Breath     HPI [name]  is a 81 y.o. male, past medical history of chronic atrial fibrillation on xarelto (followed by a Dr. Wynonia Lawman), COPD, CAD, hypertension and reflux. He presented to the hospital with issues related to shortness of breath and right-sided chest pain pleuritic in nature. Patient had reported generalized weakness and of sensation of feeling cold for about 1 week. Awakened early this morning around 3 AM with right-sided chest pain that was worse with taking a deep breath or coughing. He had productive cough of yellow phlegm. He decided to follow up at his primary care physician's office. Upon arrival to the office he was found to be in atrial fibrillation with RVR so was sent over to the ER for further evaluation.  Upon arrival to the ER he was tachycardic with a heart rate of 123 and a blood pressure of 122/69. He was afebrile. Laboratory data was consistent with acute renal failure with likely dehydration with a sodium of 134. His BUN was 21 and creatinine was 1.37. He also had leukocytosis 14,400. PCXR in the ER revealed an infiltrate in the right perihilar region and right lower lobe with an associated enlargement of the right hilum that was  questionable for adenopathy versus suprahilar/perihilar mass. CT of the chest was recommended. Last CT of the chest done in 2012 demonstrated multiple stable mostly subcentimeter nodules in both lung lobes. Because of acute renal failure CT of the chest was not done urgently in the emergency department.  When I talked to the patient he again endorsed issues related to pleuritic chest pain which seems to have improved. His daughter was at the bedside and confirmed the patient was having yellow sputum. Patient has macular degeneration and is unable to clearly identify the color of sputum he has coughed up. Patient has been endorsing increased thirst for several days more so than usual and took his Lasix as usual this morning. After I examined the patient, the daughter followed me out and showed me a picture of his nearly full bottle of diltiazem. She reports her sister had sent her this picture and had also found for other bottles full of diltiazem and they suspect he is not been taking this medication.  They reviewed his other medication bottles and it appears she has been taking them and refilling them as prescribed.  Review of Systems   In addition to the HPI above,  No Fever, subjective chills with generalized malaise but otherwise without myalgias or other constitutional symptoms No Headache, changes with Vision or hearing, new weakness, tingling, numbness in any extremity, No problems swallowing food or Liquids, indigestion/reflux No awareness of palpitations despite evidence of RVR on telemetry, denied orthopnea or DOE No Abdominal pain, N/V; no melena or hematochezia, no dark tarry stools, Bowel movements are regular, No dysuria, hematuria or flank pain No new skin rashes, lesions, masses or bruises, No new joints pains-aches No recent weight gain or loss No polyuria, polydypsia or polyphagia,  *A full 10 point Review of Systems was done, except as stated above, all other Review of Systems were  negative.  Social History History  Substance Use Topics  . Smoking status: Former Smoker who quit 3 years ago -- 1.00 pack/day for 65 years  . Smokeless tobacco: Not on file  . Alcohol Use:  no current u      Comment: 2-3 every night    Family History Family History  Problem Relation Age of Onset  . Stroke    . Cancer    . Heart attack Sister     Prior to Admission medications   Medication Sig Start Date End Date Taking? Authorizing Provider  albuterol (PROVENTIL HFA;VENTOLIN HFA) 108 (90 BASE) MCG/ACT inhaler Inhale 1-2 puffs into the lungs every 6 (six) hours as needed for wheezing or shortness of breath.   Yes Historical Provider, MD  budesonide-formoterol (SYMBICORT) 160-4.5 MCG/ACT inhaler Inhale 2 puffs into the lungs 2 (two) times daily.   Yes Historical Provider, MD  diltiazem (CARDIZEM) 90 MG tablet Take 90 mg by mouth 2 (two) times daily. Take two tablets in the morning and take one tablet in the afternoon.   Yes Historical Provider, MD  diphenhydramine-acetaminophen (TYLENOL PM) 25-500 MG TABS Take 2 tablets by mouth at bedtime as needed.   Yes Historical Provider, MD  famotidine (PEPCID) 20 MG tablet Take 20 mg by mouth daily as needed for heartburn or indigestion.   Yes Historical Provider, MD  furosemide (LASIX) 40 MG tablet Take 40 mg by mouth daily.   Yes Historical Provider, MD  rivaroxaban (XARELTO) 20 MG TABS tablet Take 20 mg by mouth daily with supper.   Yes Historical Provider, MD  zolpidem (AMBIEN) 10 MG tablet Take 5 mg by mouth at bedtime as needed for sleep.   Yes Historical Provider, MD    Allergies  Allergen Reactions  . Codeine Itching    Physical Exam  Vitals  Blood pressure 110/68, pulse 109, temperature 98.1 F (36.7 C), resp. rate 23, weight 196 lb (88.905 kg), SpO2 99 %.   General:  In no acute distress, appears healthy and well nourished  Psych:  Normal affect, Denies Suicidal or Homicidal ideations, Awake Alert, Oriented X 3. Speech  and thought patterns are clear and appropriate, no apparent short term memory deficits  Neuro:   No focal neurological deficits, CN II through XII intact, Strength 5/5 all 4 extremities, Sensation intact all 4 extremities.  ENT:  Ears and Eyes appear Normal, Conjunctivae clear, PER. Moist oral mucosa without erythema or exudates.  Neck:  Supple, No lymphadenopathy appreciated  Respiratory:  Symmetrical chest wall movement, sounds markedly decreased right base with crackles from [place] down on left, Room Air  Cardiac:  Irregular in  nature fibrillation with ventricular response at rest less than 115 bpm, No Murmurs, trace bilateral LE edema noted is chronic, no JVD, No carotid bruits, peripheral pulses palpable at 2+  Abdomen:  Positive bowel sounds, Soft, Non tender, Non distended,  No masses appreciated, no obvious hepatosplenomegaly  Skin:  No Cyanosis, Normal Skin Turgor, No Skin Rash or Bruise.  Extremities: Symmetrical without obvious trauma or injury,  no effusions.  Data Review  CBC  Recent Labs Lab 11/25/14 1220  WBC 14.4*  HGB 13.6  HCT 42.1  PLT 281  MCV 82.2  MCH 26.6  MCHC 32.3  RDW 14.2    Chemistries   Recent Labs Lab 11/25/14 1220  NA 134*  K 3.7  CL 100  CO2 25  GLUCOSE 112*  BUN 21  CREATININE 1.37*  CALCIUM 8.9    CrCl cannot be calculated (Unknown ideal weight.).  No results for input(s): TSH, T4TOTAL, T3FREE, THYROIDAB in the last 72 hours.  Invalid input(s): FREET3  Coagulation profile  Recent Labs Lab 11/25/14 1220  INR 1.13    No results for input(s): DDIMER in the last 72 hours.  Cardiac Enzymes No results for input(s): CKMB, TROPONINI, MYOGLOBIN in the last 168 hours.  Invalid input(s): CK  Invalid input(s): POCBNP  Urinalysis    Component Value Date/Time   COLORURINE YELLOW 02/13/2010 [place] 02/13/2010 1656   LABSPEC 1.010 02/13/2010 1656   PHURINE 6.5 02/13/2010 1656   GLUCOSEU NEGATIVE  02/13/2010 1656   HGBUR MODERATE* 02/13/2010 1656   BILIRUBINUR NEGATIVE 02/13/2010 1656   [place] 02/13/2010 1656   PROTEINUR 30* 02/13/2010 1656   UROBILINOGEN 0.2 02/13/2010 1656   NITRITE NEGATIVE 02/13/2010 1656   LEUKOCYTESUR NEGATIVE 02/13/2010 1656    Imaging results:   Dg Chest Port 1 View  11/25/2014   CLINICAL DATA:  Cough, shortness of breath and RIGHT-sided chest pain since 0300 hours today, history hypertension, COPD, coronary artery disease, atrial fibrillation, former smoker  EXAM: PORTABLE CHEST - 1 VIEW  COMPARISON:  04/05/2011; correlation CT chest 04/06/2011  FINDINGS: Normal heart size and pulmonary vascularity.  Enlargement of RIGHT hilum with questionable adenopathy versus suprahilar/perihilar mass.  Infiltrate identified in the RIGHT perihilar region and RIGHT lower lobe, unable to exclude underlying mass lesion.  Remaining lungs emphysematous but clear.  No pleural effusion or pneumothorax.  IMPRESSION: COPD changes with RIGHT perihilar and lower lobe infiltrate infiltrate with RIGHT hilar enlargement suspicious for adenopathy or tumor.  Further evaluation by CT chest with contrast recommended to assess for potential malignancy.   Electronically Signed   By: Lavonia Dana M.D.   On: 11/25/2014 13:26     EKG: Atrial fibrillation with ventricular response 130 bpm   Assessment & Plan  Principal Problem:   Chronic Atrial fibrillation with acute RVR -Admit to stepdown unit -Cont Cardizem infusion initially until respiratory status more stable then can transition back to oral Cardizem -Suspect RVR triggered by underlying pulmonary processes; most likely underlying pneumonia -Continue home Xarelto -It appears patient was not taking rate control agents as prescribed at home  Active Problems:   Suspected CAP with underlying COPD -Currently not hypoxic -Continue supportive care: Provide Oxygen As Needed, continue MDIs and nebs along with incentive  spirometry -Blood cultures obtained in the ER -Continue Levaquin; first dose given in ER -Obtain routine sputum culture, urinary legionella and strep pneumonia as well as HIV antibody per pneumonia protocol    Dehydration/Acute renal failure -Hold Lasix -Cont IV fluids -  Follow labs     Abnormal CT of the chest/History of tobacco abuse -Concerning for right hilar adenopathy or mass -Patient with history of 60 year smoking and previous he documented multiple pulmonary nodules -Once renal function recovers we'll need CT of the chest to better evaluate    Patient nonadherence -It appears patient was not taking his Cardizem as prescribed home -This needsto be discussed with the patient after admission; may also need to check with Dr. Thurman Coyer office to make sure he was not prescribed an alternative medication    DVT Prophylaxis: Xarelto  Family Communication:   Daughter at bedside  Code Status:  Full  Condition:  Stable  Time spent in minutes : 60   ELLIS,ALLISON L. ANP on 11/25/2014 at 3:32 PM  Between 7am to 7pm - Pager - [phone]  After 7pm go to [email or web address] - password [email or web address]  And look for the night coverage person covering me after hours  [facility]

## 2014-11-26 ENCOUNTER — Encounter (HOSPITAL_COMMUNITY): Payer: Self-pay | Admitting: Radiology

## 2014-11-26 ENCOUNTER — Inpatient Hospital Stay (HOSPITAL_COMMUNITY): Payer: Medicare Other

## 2014-11-26 DIAGNOSIS — I4891 Unspecified atrial fibrillation: Secondary | ICD-10-CM

## 2014-11-26 LAB — CBC
HCT: 37.6 % — ABNORMAL LOW (ref 39.0–52.0)
Hemoglobin: 11.7 g/dL — ABNORMAL LOW (ref 13.0–17.0)
MCH: 25.1 pg — AB (ref 26.0–34.0)
MCHC: 31.1 g/dL (ref 30.0–36.0)
MCV: 80.5 fL (ref 78.0–100.0)
PLATELETS: 256 10*3/uL (ref 150–400)
RBC: 4.67 MIL/uL (ref 4.22–5.81)
RDW: 14.5 % (ref 11.5–15.5)
WBC: 15.3 10*3/uL — AB (ref 4.0–10.5)

## 2014-11-26 LAB — BASIC METABOLIC PANEL
Anion gap: 7 (ref 5–15)
BUN: 19 mg/dL (ref 6–23)
CO2: 24 mmol/L (ref 19–32)
Calcium: 8.3 mg/dL — ABNORMAL LOW (ref 8.4–10.5)
Chloride: 103 mmol/L (ref 96–112)
Creatinine, Ser: 1.32 mg/dL (ref 0.50–1.35)
GFR calc Af Amer: 58 mL/min — ABNORMAL LOW (ref >90)
GFR calc non Af Amer: 50 mL/min — ABNORMAL LOW (ref >90)
Glucose, Bld: 91 mg/dL (ref 70–99)
POTASSIUM: 3.7 mmol/L (ref 3.5–5.1)
Sodium: 134 mmol/L — ABNORMAL LOW (ref 135–145)

## 2014-11-26 LAB — APTT: aPTT: 39 seconds — ABNORMAL HIGH (ref 24–37)

## 2014-11-26 LAB — TROPONIN I

## 2014-11-26 LAB — HEPARIN LEVEL (UNFRACTIONATED): Heparin Unfractionated: 1.92 IU/mL — ABNORMAL HIGH (ref 0.30–0.70)

## 2014-11-26 MED ORDER — GUAIFENESIN ER 600 MG PO TB12
1200.0000 mg | ORAL_TABLET | Freq: Two times a day (BID) | ORAL | Status: DC
  Administered 2014-11-26 – 2014-12-01 (×11): 1200 mg via ORAL
  Filled 2014-11-26 (×12): qty 2

## 2014-11-26 MED ORDER — HEPARIN (PORCINE) IN NACL 100-0.45 UNIT/ML-% IJ SOLN
1200.0000 [IU]/h | INTRAMUSCULAR | Status: AC
  Administered 2014-11-26: 1100 [IU]/h via INTRAVENOUS
  Administered 2014-11-28: 1200 [IU]/h via INTRAVENOUS
  Filled 2014-11-26 (×4): qty 250

## 2014-11-26 MED ORDER — IOHEXOL 300 MG/ML  SOLN
80.0000 mL | Freq: Once | INTRAMUSCULAR | Status: AC | PRN
  Administered 2014-11-26: 80 mL via INTRAVENOUS

## 2014-11-26 NOTE — Progress Notes (Signed)
ANTICOAGULATION CONSULT NOTE - Initial Consult  Pharmacy Consult for Heparin (while Xarelto on hold) Indication: atrial fibrillation  Allergies  Allergen Reactions  . Codeine Itching    Patient Measurements: Height: 5\' 10"  (177.8 cm) Weight: 201 lb 12.8 oz (91.536 kg) IBW/kg (Calculated) : 73  Vital Signs: Temp: 97.1 F (36.2 C) (02/21 1158) Temp Source: Oral (02/21 1158) BP: 109/77 mmHg (02/21 1158) Pulse Rate: 98 (02/21 1158)  Labs:  Recent Labs  11/25/14 1220 11/26/14 0340 11/26/14 0920  HGB 13.6 11.7*  --   HCT 42.1 37.6*  --   PLT 281 256  --   LABPROT 14.6  --   --   INR 1.13  --   --   CREATININE 1.37* 1.32  --   TROPONINI  --   --  <0.03    Estimated Creatinine Clearance: 51.6 mL/min (by C-G formula based on Cr of 1.32).   Medical History: Past Medical History  Diagnosis Date  . Syncope   . COPD (chronic obstructive pulmonary disease)   . Chronic atrial fibrillation   . GERD (gastroesophageal reflux disease)   . Coronary artery disease   . Hypertension   . Kidney stone     Assessment: 81 yo m admitted on 2/20 for pneumonia.  Patient has afib and is on chronic anticoagulation with Xarelto.  Xarelto was resumed on admission (last dose 2/20 @ 1651).  Pharmacy is consulted to begin heparin and discontinue Xarelto.  Xarelto has the potential to affect the heparin level (anti-Xa level), so will check a baseline heparin level and aPTT and dose based on aPTT until Xarelto is no longer affecting heparin assay. Also, heparin is to start 24 hours after last dose of Xarelto.   Goal of Therapy:  Heparin level 0.3-0.7 units/ml aPTT 66-102 seconds Monitor platelets by anticoagulation protocol: Yes   Plan:  Check baseline HL and aPTT now Begin heparin infusion at 1100 units/hr (~12 units/kg/hr) at 1700 tonight 8-hr HL/aPTT @ 0100 Monitor hgb/plts, s/s of bleeding, clinical course  Cassie L. Nicole Kindred, PharmD Clinical Pharmacy Resident Pager:  [phone] 11/26/2014 2:10 PM

## 2014-11-26 NOTE — Progress Notes (Signed)
1750:  Nurse contacted MD to inform that post breathing treatment; pt continues to experience dyspnea at rest with RR remaining in 30's.  Pt HR sustained 130's, nurse increased Cardizem to 15.  MD requested nurse to order STAT portable CXR.  Nurse will carry out orders and will continue to monitor pt.   1651: Pt experiencing dsypnea at rest with RR ranging in 30's.  Nurse called respiratory for PRN breathing treatment.  Nurse increased oxygen level and communicated with MD assessment, nurse will continue to monitor.

## 2014-11-26 NOTE — Consult Note (Signed)
CARDIOLOGY CONSULT NOTE   Patient ID: [name] MRN: [id number], DOB/AGE: Nov 18, 1932   Admit date: 11/25/2014 Date of Consult: 11/26/2014   Primary Physician: [name], MD Primary Cardiologist: Dr. Wynonia Lawman  Pt. Profile  81 year old gentleman admitted with pneumonia.  He has chronic atrial fibrillation and has been on long-term Xarelto and rate control with diltiazem.  He is followed by Dr. Wynonia Lawman.  Problem List  Past Medical History  Diagnosis Date  . Syncope   . COPD (chronic obstructive pulmonary disease)   . Chronic atrial fibrillation   . GERD (gastroesophageal reflux disease)   . Coronary artery disease   . Hypertension   . Kidney stone     Past Surgical History  Procedure Laterality Date  . Appendectomy    . Cholecystectomy    . Esophageal dilation       Allergies  Allergies  Allergen Reactions  . Codeine Itching    HPI   This 81 year old gentleman has a history of chronic atrial fibrillation treated with rate control and chronic anticoagulation.  He is admitted with a acute pneumonia.  He has been having pleuritic chest pain.  There is no evidence of myocardial injury.  His ventricular rate was initially elevated secondary to the stress of the pneumonia.  This has improved on IV Cardizem. The patient has a 60-pack-year history of smoking.  He quit smoking about 5 years ago. Unfortunately, CT scan of the chest today reveals a large right hilar mass with probable post obstructive pneumonia.  Inpatient Medications  . budesonide-formoterol  2 puff Inhalation BID  . diltiazem  15 mg Intravenous Once  . docusate sodium  100 mg Oral BID  . guaiFENesin  1,200 mg Oral BID  . [START ON 11/27/2014] levofloxacin (LEVAQUIN) IV  750 mg Intravenous Q48H  . pneumococcal 23 valent vaccine  0.5 mL Intramuscular Tomorrow-1000  . rivaroxaban  20 mg Oral Q supper  . sodium chloride  3 mL Intravenous Q12H    Family History Family History  Problem Relation  Age of Onset  . Stroke    . Cancer    . Heart attack Sister      Social History History   Social History  . Marital Status: Married    Spouse Name: N/A  . Number of Children: N/A  . Years of Education: N/A   Occupational History  . Not on file.   Social History Main Topics  . Smoking status: Former Smoker -- 0.00 packs/day for 65 years  . Smokeless tobacco: Not on file  . Alcohol Use: Yes     Comment: 2-3 every night  . Drug Use: Not on file  . Sexual Activity: Not on file   Other Topics Concern  . Not on file   Social History Narrative     Review of Systems  General:  Recent chills Cardiovascular:  No chest pain, dyspnea on exertion, edema, orthopnea, palpitations, paroxysmal nocturnal dyspnea. Dermatological: No rash, lesions/masses Respiratory: Recent cough and yellow sputum.  No hemoptysis. Urologic: No hematuria, dysuria Abdominal:   No nausea, vomiting, diarrhea, bright red blood per rectum, melena, or hematemesis Neurologic:  No visual changes, wkns, changes in mental status. All other systems reviewed and are otherwise negative except as noted above.  Physical Exam  Blood pressure 109/77, pulse 98, temperature 97.1 F (36.2 C), temperature source Oral, resp. rate 27, height 5\' 10"  (1.778 m), weight 201 lb 12.8 oz (91.536 kg), SpO2 99 %.  General: Pleasant, NAD Psych: Normal affect.  Neuro: Alert and oriented X 3. Moves all extremities spontaneously. HEENT: Normal  Neck: Supple without bruits or JVD. Lungs:  Decreased breath sounds and rhonchi right chest. Heart: Irregularly irregular rhythm. no s3, s4, or murmurs. Abdomen: Soft, non-tender, non-distended, BS + x 4.  Extremities: No clubbing, cyanosis or edema. DP/PT/Radials 2+ and equal bilaterally.  Labs   Recent Labs  11/26/14 0920  TROPONINI <0.03   Lab Results  Component Value Date   WBC 15.3* 11/26/2014   HGB 11.7* 11/26/2014   HCT 37.6* 11/26/2014   MCV 80.5 11/26/2014   PLT 256  11/26/2014    Recent Labs Lab 11/26/14 0340  NA 134*  K 3.7  CL 103  CO2 24  BUN 19  CREATININE 1.32  CALCIUM 8.3*  GLUCOSE 91   Lab Results  Component Value Date   CHOL  02/14/2010    161        ATP III CLASSIFICATION:  <200     mg/dL   Desirable  200-239  mg/dL   Borderline High  >=240    mg/dL   High          HDL 94 02/14/2010   LDLCALC  02/14/2010    57        Total Cholesterol/HDL:CHD Risk Coronary Heart Disease Risk Table                     Men   Women  1/2 Average Risk   3.4   3.3  Average Risk       5.0   4.4  2 X Average Risk   9.6   7.1  3 X Average Risk  23.4   11.0        Use the calculated Patient Ratio above and the CHD Risk Table to determine the patient's CHD Risk.        ATP III CLASSIFICATION (LDL):  <100     mg/dL   Optimal  100-129  mg/dL   Near or Above                    Optimal  130-159  mg/dL   Borderline  160-189  mg/dL   High  >190     mg/dL   Very High   TRIG 52 02/14/2010   Lab Results  Component Value Date   DDIMER 0.52* 04/05/2011    Radiology/Studies  Ct Chest W Contrast  11/26/2014   CLINICAL DATA:  Abnormal chest radiograph with question RIGHT hilar mass versus adenopathy and RIGHT lung infiltrates  EXAM: CT CHEST WITH CONTRAST  TECHNIQUE: Multidetector CT imaging of the chest was performed during intravenous contrast administration. Sagittal and coronal MPR images reconstructed from axial data set.  CONTRAST:  84mL OMNIPAQUE IOHEXOL 300 MG/ML  SOLN IV  COMPARISON:  Chest radiograph 11/25/2014, CT chest 04/06/2011  FINDINGS: Atherosclerotic calcifications aorta and coronary arteries.  Aneurysmal dilatation ascending thoracic aorta 4.2 x 4.3 cm image 31.  Calcified subcarinal lymph node.  Additional minimally enlarged noncalcified subcarinal lymph node 11 mm short axis image 29.  Normal sized precarinal and prevascular lymph nodes.  Nonobstructing RIGHT renal calculi.  Small cyst upper pole LEFT kidney 2.4 x 2.3 cm image 63.   Post cholecystectomy.  Large RIGHT suprahilar mass invading the RIGHT hilum, 6.5 x 4.8 x 6.4 cm.  Mass occludes the RIGHT upper lobe bronchus.  Suspected confluent RIGHT hilar lymph nodes.  Extensive consolidation identified in the anterior segment of the RIGHT  upper lobe with less consolidation seen in the RIGHT middle lobe, may represent postobstructive pneumonitis though lymphangitic tumor spread not completely excluded.  Stellate nodular density RIGHT upper lobe 8 mm diameter image 25 unchanged.  5 mm RIGHT lower lobe nodule image 48 unchanged.  Additional RIGHT lower lobe nodule 4 mm diameter image 53 unchanged.  7 mm LEFT lower lobe nodule image 43 probably unchanged 6 mm RIGHT lower lobe nodule image 38 stability uncertain.  Small RIGHT pleural effusion.  Underlying severe emphysematous changes greatest in upper lobes.  No definite osseous metastatic lesions.  IMPRESSION: Large medial RIGHT upper lobe suprahilar mass 6.5 x 4.8 6.4 cm invading the RIGHT hilum compatible prior brain neoplasm.  Probable confluent RIGHT hilar adenopathy.  Pulmonary nodules bilaterally, some of which are stable, additional which are of uncertain stability due to presence of infiltrates on previous exam.  Postobstructive pneumonitis involving the anterior segment RIGHT upper lobe and medial segment of RIGHT middle lobe, though lymphangitic tumor spread not excluded.   Electronically Signed   By: Lavonia Dana M.D.   On: 11/26/2014 11:21   Dg Chest Port 1 View  11/25/2014   CLINICAL DATA:  Cough, shortness of breath and RIGHT-sided chest pain since 0300 hours today, history hypertension, COPD, coronary artery disease, atrial fibrillation, former smoker  EXAM: PORTABLE CHEST - 1 VIEW  COMPARISON:  04/05/2011; correlation CT chest 04/06/2011  FINDINGS: Normal heart size and pulmonary vascularity.  Enlargement of RIGHT hilum with questionable adenopathy versus suprahilar/perihilar mass.  Infiltrate identified in the RIGHT perihilar  region and RIGHT lower lobe, unable to exclude underlying mass lesion.  Remaining lungs emphysematous but clear.  No pleural effusion or pneumothorax.  IMPRESSION: COPD changes with RIGHT perihilar and lower lobe infiltrate infiltrate with RIGHT hilar enlargement suspicious for adenopathy or tumor.  Further evaluation by CT chest with contrast recommended to assess for potential malignancy.   Electronically Signed   By: Lavonia Dana M.D.   On: 11/25/2014 13:26    ECG  26-Nov-2014 06:29:30 [facility] ROUTINE RECORD Atrial fibrillation Left axis deviation Pulmonary disease pattern Abnormal ECG Personally reviewed  ASSESSMENT AND PLAN 1.  Chronic atrial fibrillation on long-term Xarelto 2.  Acute right middle lobe and right upper lobe pneumonia, possibly postobstructive behind right hilar mass. 3.  Chest pain consistent with pleurisy secondary to pneumonia  Disposition: Continue IV diltiazem for rate control.  Consider switch to oral medication tomorrow depending on clinical course and plans for other interventions. We will switch from the Xarelto to IV heparin in order to allow for possible invasive workup of his right hilar mass.  May need bronchoscopy by pulmonary.  Consider pulmonary consultation.   Signed, Darlin Coco, MD  11/26/2014, 1:28 PM

## 2014-11-26 NOTE — Progress Notes (Signed)
Nurse contacted lab to request Troponin draw as ordered during 0700 hour.  Lab stated draw will occur shortly.

## 2014-11-26 NOTE — Progress Notes (Signed)
Utilization Review Completed.Donne Anon T2/21/2016

## 2014-11-26 NOTE — Progress Notes (Signed)
Echocardiogram 2D Echocardiogram has been performed.  Doyle Askew 11/26/2014, 3:01 PM

## 2014-11-26 NOTE — Progress Notes (Signed)
Patient Demographics  [name], is a 81 y.o. male, DOB - Oct 17, 1932, [id number]  Admit date - 11/25/2014   Admitting Physician Verlee Monte, MD  Outpatient Primary MD for the patient is [name], MD  LOS - 1   Chief Complaint  Patient presents with  . Shortness of Breath      Admission history of present illness/brief narrative;  [name] is a 81 y.o. male, past medical history of chronic atrial fibrillation on xarelto (followed by a Dr. Wynonia Lawman), COPD, CAD, hypertension and reflux. He presented to the hospital with shortness of breath and right-sided chest pain pleuritic in nature.  He had productive cough of yellow phlegm. Was found to have A. fib with RVR at PCP office, so he was sent to ED . Patient had leukocytosis, A. fib with RVR heart rate in the 130s, patient was started on Cardizem drip, chest x-ray showing right-sided pneumonia, with right hilum enlargement , as well patient report pleuritic right-sided chest pain with cough, troponins were negative, no EKG changes, patient was admitted for community-acquired pneumonia treatment.  Subjective:   [name] today has, No headache, No abdominal pain - No Nausea, No new weakness tingling or numbness, complaints of cough, pleuritic chest pain with coughing.  Assessment & Plan    Principal Problem:   Atrial fibrillation with RVR Active Problems:   CAP (community acquired pneumonia)   Dehydration   Acute renal failure   History of tobacco abuse   COPD (chronic obstructive pulmonary disease)   Abnormal CT of the chest   Patient nonadherence  A. fib with RVR - Most likely related to sepsis from community-acquired pneumonia. - Continue with Cardizem drip . - Continue with Xarelto for anticoagulation.  Chest pain - Appears to be pleuritic in nature secondary to cough. - Check  troponin -EEG showing no significant ST abnormalities  Community acquired pneumonia - Continue with levofloxacin - Follow On sputum culture - Check CT chest with IV contrast given his abnormal chest x-ray  Dehydration - Continue to hold Lasix - Continue with IV fluid  Code Status: Full  Family Communication: Daughter at bedside  Disposition Plan: Remains on step down   Procedures  None   Consults   Cardiology   Medications  Scheduled Meds: . budesonide-formoterol  2 puff Inhalation BID  . diltiazem  15 mg Intravenous Once  . docusate sodium  100 mg Oral BID  . [START ON 11/27/2014] levofloxacin (LEVAQUIN) IV  750 mg Intravenous Q48H  . pneumococcal 23 valent vaccine  0.5 mL Intramuscular Tomorrow-1000  . rivaroxaban  20 mg Oral Q supper  . sodium chloride  3 mL Intravenous Q12H   Continuous Infusions: . sodium chloride 125 mL/hr at 11/26/14 0349  . diltiazem (CARDIZEM) infusion 5 mg/hr (11/26/14 0900)   PRN Meds:.acetaminophen **OR** acetaminophen, albuterol, alum & mag hydroxide-simeth, famotidine, morphine injection, ondansetron **OR** ondansetron (ZOFRAN) IV, oxyCODONE, polyvinyl alcohol, zolpidem  DVT Prophylaxis  on Xarelto  Lab Results  Component Value Date   PLT 256 11/26/2014    Antibiotics    Anti-infectives    Start     Dose/Rate Route Frequency Ordered Stop   11/27/14 1500  levofloxacin (LEVAQUIN) IVPB 750 mg     750 mg 100  mL/hr over 90 Minutes Intravenous Every 48 hours 11/25/14 1621 12/03/14 1459   11/25/14 1630  levofloxacin (LEVAQUIN) IVPB 750 mg  Status:  Discontinued     750 mg 100 mL/hr over 90 Minutes Intravenous Every 24 hours 11/25/14 1618 11/25/14 1620   11/25/14 1415  levofloxacin (LEVAQUIN) IVPB 750 mg     750 mg 100 mL/hr over 90 Minutes Intravenous  Once 11/25/14 1403 11/25/14 1645          Objective:   Filed Vitals:   11/26/14 0809 11/26/14 0831 11/26/14 0900 11/26/14 0940  BP:  106/58  127/89  Pulse:  98 94 99    Temp:  98.2 F (36.8 C)    TempSrc:  Oral    Resp:  17 18 20   Height:      Weight:      SpO2: 92% 94%      Wt Readings from Last 3 Encounters:  11/26/14 91.536 kg (201 lb 12.8 oz)     Intake/Output Summary (Last 24 hours) at 11/26/14 0956 Last data filed at 11/26/14 0934  Gross per 24 hour  Intake 2687.67 ml  Output    850 ml  Net 1837.67 ml     Physical Exam  Awake Alert, Oriented X 3, No new F.N deficits, Normal affect [place].AT,PERRAL Supple Neck,No JVD, No cervical lymphadenopathy appriciated.  Symmetrical Chest wall movement, Good air movement bilaterally,  Irregular irregular,No Gallops,Rubs or new Murmurs, No Parasternal Heave +ve B.Sounds, Abd Soft, No tenderness, No organomegaly appriciated, No rebound - guarding or rigidity. No Cyanosis, Clubbing or edema, No new Rash or bruise     Data Review   Micro Results Recent Results (from the past 240 hour(s))  MRSA PCR Screening     Status: None   Collection Time: 11/25/14  4:12 PM  Result Value Ref Range Status   MRSA by PCR NEGATIVE NEGATIVE Final    Comment:        The GeneXpert MRSA Assay (FDA approved for NASAL specimens only), is one component of a comprehensive MRSA colonization surveillance program. It is not intended to diagnose MRSA infection nor to guide or monitor treatment for MRSA infections.   Culture, sputum-assessment     Status: None   Collection Time: 11/25/14  7:00 PM  Result Value Ref Range Status   Specimen Description SPUTUM  Final   Special Requests NONE  Final   Sputum evaluation   Final    THIS SPECIMEN IS ACCEPTABLE. RESPIRATORY CULTURE REPORT TO FOLLOW.   Report Status 11/25/2014 FINAL  Final    Radiology Reports Dg Chest Port 1 View  11/25/2014   CLINICAL DATA:  Cough, shortness of breath and RIGHT-sided chest pain since 0300 hours today, history hypertension, COPD, coronary artery disease, atrial fibrillation, former smoker  EXAM: PORTABLE CHEST - 1 VIEW  COMPARISON:   04/05/2011; correlation CT chest 04/06/2011  FINDINGS: Normal heart size and pulmonary vascularity.  Enlargement of RIGHT hilum with questionable adenopathy versus suprahilar/perihilar mass.  Infiltrate identified in the RIGHT perihilar region and RIGHT lower lobe, unable to exclude underlying mass lesion.  Remaining lungs emphysematous but clear.  No pleural effusion or pneumothorax.  IMPRESSION: COPD changes with RIGHT perihilar and lower lobe infiltrate infiltrate with RIGHT hilar enlargement suspicious for adenopathy or tumor.  Further evaluation by CT chest with contrast recommended to assess for potential malignancy.   Electronically Signed   By: Lavonia Dana M.D.   On: 11/25/2014 13:26    CBC  Recent Labs Lab  11/25/14 1220 11/26/14 0340  WBC 14.4* 15.3*  HGB 13.6 11.7*  HCT 42.1 37.6*  PLT 281 256  MCV 82.2 80.5  MCH 26.6 25.1*  MCHC 32.3 31.1  RDW 14.2 14.5    Chemistries   Recent Labs Lab 11/25/14 1220 11/26/14 0340  NA 134* 134*  K 3.7 3.7  CL 100 103  CO2 25 24  GLUCOSE 112* 91  BUN 21 19  CREATININE 1.37* 1.32  CALCIUM 8.9 8.3*   ------------------------------------------------------------------------------------------------------------------ estimated creatinine clearance is 51.6 mL/min (by C-G formula based on Cr of 1.32). ------------------------------------------------------------------------------------------------------------------ No results for input(s): HGBA1C in the last 72 hours. ------------------------------------------------------------------------------------------------------------------ No results for input(s): CHOL, HDL, LDLCALC, TRIG, CHOLHDL, LDLDIRECT in the last 72 hours. ------------------------------------------------------------------------------------------------------------------ No results for input(s): TSH, T4TOTAL, T3FREE, THYROIDAB in the last 72 hours.  Invalid input(s):  FREET3 ------------------------------------------------------------------------------------------------------------------ No results for input(s): VITAMINB12, FOLATE, FERRITIN, TIBC, IRON, RETICCTPCT in the last 72 hours.  Coagulation profile  Recent Labs Lab 11/25/14 1220  INR 1.13    No results for input(s): DDIMER in the last 72 hours.  Cardiac Enzymes No results for input(s): CKMB, TROPONINI, MYOGLOBIN in the last 168 hours.  Invalid input(s): CK ------------------------------------------------------------------------------------------------------------------ Invalid input(s): POCBNP     Time Spent in minutes   30 minutes   Vasilisa Vore M.D on 11/26/2014 at 9:56 AM  Between 7am to 7pm - Pager - [phone]  After 7pm go to [email or web address] - password [email or web address]  And look for the night coverage person covering for me after hours  [facility] Office  [phone]   **Disclaimer: This note may have been dictated with voice recognition software. Similar sounding words can inadvertently be transcribed and this note may contain transcription errors which may not have been corrected upon publication of note.**

## 2014-11-26 NOTE — Progress Notes (Addendum)
Nurse contacted MD to inform that pt had experienced CP overnight and c/o of CP this am during initial assessment.  Night nurse obtained 12 lead EKG.  Pt experiencing soft BP; systolic low BP ranging low 90's-low 100's with intermittent tachycardia.  MD acknowledged nurse concerns; instructed nurse to order Troponin level (nurse called lab to ensure order was acknowledged), decrease Cardizem level to 5 and hold PRN pain med Morphine at this time.  Nurse will carry out orders as instructed and will continue to monitor pt.

## 2014-11-27 DIAGNOSIS — J449 Chronic obstructive pulmonary disease, unspecified: Secondary | ICD-10-CM

## 2014-11-27 LAB — BASIC METABOLIC PANEL
ANION GAP: 4 — AB (ref 5–15)
BUN: 14 mg/dL (ref 6–23)
CHLORIDE: 107 mmol/L (ref 96–112)
CO2: 23 mmol/L (ref 19–32)
Calcium: 8 mg/dL — ABNORMAL LOW (ref 8.4–10.5)
Creatinine, Ser: 1.05 mg/dL (ref 0.50–1.35)
GFR calc non Af Amer: 65 mL/min — ABNORMAL LOW (ref >90)
GFR, EST AFRICAN AMERICAN: 76 mL/min — AB (ref >90)
Glucose, Bld: 100 mg/dL — ABNORMAL HIGH (ref 70–99)
POTASSIUM: 3.4 mmol/L — AB (ref 3.5–5.1)
Sodium: 134 mmol/L — ABNORMAL LOW (ref 135–145)

## 2014-11-27 LAB — CBC
HCT: 32.1 % — ABNORMAL LOW (ref 39.0–52.0)
Hemoglobin: 10.2 g/dL — ABNORMAL LOW (ref 13.0–17.0)
MCH: 25.4 pg — ABNORMAL LOW (ref 26.0–34.0)
MCHC: 31.8 g/dL (ref 30.0–36.0)
MCV: 79.9 fL (ref 78.0–100.0)
PLATELETS: 182 10*3/uL (ref 150–400)
RBC: 4.02 MIL/uL — AB (ref 4.22–5.81)
RDW: 14.5 % (ref 11.5–15.5)
WBC: 10.8 10*3/uL — ABNORMAL HIGH (ref 4.0–10.5)

## 2014-11-27 LAB — HEPARIN LEVEL (UNFRACTIONATED)
HEPARIN UNFRACTIONATED: 0.51 [IU]/mL (ref 0.30–0.70)
Heparin Unfractionated: 0.99 IU/mL — ABNORMAL HIGH (ref 0.30–0.70)

## 2014-11-27 LAB — HIV ANTIBODY (ROUTINE TESTING W REFLEX): HIV Screen 4th Generation wRfx: NONREACTIVE

## 2014-11-27 LAB — APTT
APTT: 69 s — AB (ref 24–37)
aPTT: 56 seconds — ABNORMAL HIGH (ref 24–37)
aPTT: 67 seconds — ABNORMAL HIGH (ref 24–37)

## 2014-11-27 LAB — LEGIONELLA ANTIGEN, URINE

## 2014-11-27 MED ORDER — LEVOFLOXACIN IN D5W 750 MG/150ML IV SOLN
750.0000 mg | Freq: Every day | INTRAVENOUS | Status: DC
  Administered 2014-11-27: 750 mg via INTRAVENOUS
  Filled 2014-11-27 (×2): qty 150

## 2014-11-27 MED ORDER — LEVOFLOXACIN IN D5W 750 MG/150ML IV SOLN
750.0000 mg | Freq: Every day | INTRAVENOUS | Status: DC
Start: 2014-11-27 — End: 2014-11-27
  Filled 2014-11-27: qty 150

## 2014-11-27 MED ORDER — CETYLPYRIDINIUM CHLORIDE 0.05 % MT LIQD: 7.0000 mL | Freq: Two times a day (BID) | OROMUCOSAL | Status: DC

## 2014-11-27 MED ORDER — ALBUTEROL SULFATE (2.5 MG/3ML) 0.083% IN NEBU
2.5000 mg | INHALATION_SOLUTION | Freq: Three times a day (TID) | RESPIRATORY_TRACT | Status: DC
  Administered 2014-11-27 – 2014-12-01 (×9): 2.5 mg via RESPIRATORY_TRACT
  Filled 2014-11-27 (×12): qty 3

## 2014-11-27 NOTE — Progress Notes (Signed)
[place] for Heparin (while Xarelto on hold) Indication: atrial fibrillation  Allergies  Allergen Reactions  . Codeine Itching     Recent Labs  11/25/14 1220 11/26/14 0340 11/26/14 0920 11/26/14 1535 11/27/14 0113  HGB 13.6 11.7*  --   --  10.2*  HCT 42.1 37.6*  --   --  32.1*  PLT 281 256  --   --  182  APTT  --   --   --  39* 69*  LABPROT 14.6  --   --   --   --   INR 1.13  --   --   --   --   HEPARINUNFRC  --   --   --  1.92* 0.99*  CREATININE 1.37* 1.32  --   --  1.05  TROPONINI  --   --  <0.03  --   --     Estimated Creatinine Clearance: 64.9 mL/min (by C-G formula based on Cr of 1.05).   Assessment: 81 yo m admitted on 2/20 for pneumonia.  Patient has afib and is on chronic anticoagulation with Xarelto.  Xarelto was resumed on admission (last dose 2/20 @ 1651).  Pharmacy is consulted to begin heparin and discontinue Xarelto.  Xarelto has the potential to affect the heparin level (anti-Xa level), so will check a baseline heparin level and aPTT and dose based on aPTT until Xarelto is no longer affecting heparin assay. Also, heparin is to start 24 hours after last dose of Xarelto.  Initial aPTT is therapeutic 69sec, HL elevated due to xarelto effects.  Goal of Therapy:  Heparin level 0.3-0.7 units/ml aPTT 66-102 seconds Monitor platelets by anticoagulation protocol: Yes   Plan:  Cont heparin infusion at 1100 units/hr  8-hr HL/aPTT to confirm Monitor hgb/plts, s/s of bleeding, clinical course  Thanks for allowing pharmacy to be a part of this patient's care.  Excell Seltzer, PharmD Clinical Pharmacist, [phone] 11/27/2014 2:09 AM

## 2014-11-27 NOTE — Progress Notes (Signed)
Patient Demographics  [name], is a 81 y.o. male, DOB - 09-07-1933, GXQ:[id number]  Admit date - 11/25/2014   Admitting Physician Verlee Monte, MD  Outpatient Primary MD for the patient is [name], MD  LOS - 2   Chief Complaint  Patient presents with  . Shortness of Breath      Admission history of present illness/brief narrative;  [name] is a 81 y.o. male, past medical history of chronic atrial fibrillation on xarelto (followed by a Dr. Wynonia Lawman), COPD, CAD, hypertension and reflux. He presented to the hospital with shortness of breath and right-sided chest pain pleuritic in nature.  He had productive cough of yellow phlegm. Was found to have A. fib with RVR at PCP office, so he was sent to ED . Patient had leukocytosis, A. fib with RVR heart rate in the 130s, patient was started on Cardizem drip, chest x-ray showing right-sided pneumonia, with right hilum enlargement , as well patient report pleuritic right-sided chest pain with cough, troponins were negative, no EKG changes, patient was admitted for community-acquired pneumonia treatment.  Subjective:   [name] today has, No headache, No abdominal pain - No Nausea, No new weakness tingling or numbness, improvement of cough, pleuritic chest pain  and cough.  Assessment & Plan    Principal Problem:   Atrial fibrillation with RVR Active Problems:   CAP (community acquired pneumonia)   Dehydration   Acute renal failure   History of tobacco abuse   COPD (chronic obstructive pulmonary disease)   Abnormal CT of the chest   Patient nonadherence    Right lung mass - Most likely related to malignancy - Consulted by pulmonary for evaluation and possible biopsy - Continue to hold Xarelto and heparin drip pending decision about procedure  A. fib with RVR - Most likely related to  sepsis from community-acquired pneumonia. - Continue with Cardizem drip . - On Xarelto at home, currently on heparin drip for possible procedure regarding lung mass. - Management as per cardiology.   Chest pain - Appears to be pleuritic in nature secondary to cough. -EEG showing no significant ST abnormalities   Community acquired pneumonia/postobstructive pneumonia - Continue with levofloxacin - Follow On sputum culture   Dehydration - Continue to hold Lasix - Continue with IV fluid  Code Status: Full  Family Communication: Daughter at bedside  Disposition Plan: Remains on step down   Procedures  None   Consults   Cardiology   Medications  Scheduled Meds: . budesonide-formoterol  2 puff Inhalation BID  . diltiazem  15 mg Intravenous Once  . docusate sodium  100 mg Oral BID  . guaiFENesin  1,200 mg Oral BID  . levofloxacin (LEVAQUIN) IV  750 mg Intravenous Q48H  . pneumococcal 23 valent vaccine  0.5 mL Intramuscular Tomorrow-1000  . sodium chloride  3 mL Intravenous Q12H   Continuous Infusions: . sodium chloride 75 mL/hr at 11/26/14 2059  . diltiazem (CARDIZEM) infusion 10 mg/hr (11/27/14 1448)  . heparin 1,100 Units/hr (11/26/14 1619)   PRN Meds:.acetaminophen **OR** acetaminophen, albuterol, alum & mag hydroxide-simeth, famotidine, morphine injection, ondansetron **OR** ondansetron (ZOFRAN) IV, oxyCODONE, polyvinyl alcohol, zolpidem  DVT Prophylaxis  on Xarelto  Lab Results  Component Value Date   PLT  182 11/27/2014    Antibiotics    Anti-infectives    Start     Dose/Rate Route Frequency Ordered Stop   11/27/14 1500  levofloxacin (LEVAQUIN) IVPB 750 mg     750 mg 100 mL/hr over 90 Minutes Intravenous Every 48 hours 11/25/14 1621 12/03/14 1459   11/25/14 1630  levofloxacin (LEVAQUIN) IVPB 750 mg  Status:  Discontinued     750 mg 100 mL/hr over 90 Minutes Intravenous Every 24 hours 11/25/14 1618 11/25/14 1620   11/25/14 1415  levofloxacin  (LEVAQUIN) IVPB 750 mg     750 mg 100 mL/hr over 90 Minutes Intravenous  Once 11/25/14 1403 11/25/14 1645          Objective:   Filed Vitals:   11/26/14 2320 11/27/14 0354 11/27/14 0630 11/27/14 0812  BP: 117/87 155/91 101/63 110/98  Pulse: 122 91 95 89  Temp: 98.3 F (36.8 C) 99 F (37.2 C)  98.8 F (37.1 C)  TempSrc: Oral Oral  Oral  Resp: 24 28 32 22  Height:      Weight:  93.441 kg (206 lb)    SpO2: 93% 96% 93% 92%    Wt Readings from Last 3 Encounters:  11/27/14 93.441 kg (206 lb)     Intake/Output Summary (Last 24 hours) at 11/27/14 1133 Last data filed at 11/27/14 1100  Gross per 24 hour  Intake 1798.92 ml  Output   1125 ml  Net 673.92 ml     Physical Exam  Awake Alert, Oriented X 3, No new F.N deficits, Normal affect [place].AT,PERRAL Supple Neck,No JVD, No cervical lymphadenopathy appriciated.  Symmetrical Chest wall movement, Good air movement bilaterally,  Irregular irregular,No Gallops,Rubs or new Murmurs, No Parasternal Heave +ve B.Sounds, Abd Soft, No tenderness, No organomegaly appriciated, No rebound - guarding or rigidity. No Cyanosis, Clubbing or edema, No new Rash or bruise     Data Review   Micro Results Recent Results (from the past 240 hour(s))  MRSA PCR Screening     Status: None   Collection Time: 11/25/14  4:12 PM  Result Value Ref Range Status   MRSA by PCR NEGATIVE NEGATIVE Final    Comment:        The GeneXpert MRSA Assay (FDA approved for NASAL specimens only), is one component of a comprehensive MRSA colonization surveillance program. It is not intended to diagnose MRSA infection nor to guide or monitor treatment for MRSA infections.   Culture, sputum-assessment     Status: None   Collection Time: 11/25/14  7:00 PM  Result Value Ref Range Status   Specimen Description SPUTUM  Final   Special Requests NONE  Final   Sputum evaluation   Final    THIS SPECIMEN IS ACCEPTABLE. RESPIRATORY CULTURE REPORT TO FOLLOW.    Report Status 11/25/2014 FINAL  Final  Culture, respiratory (NON-Expectorated)     Status: None (Preliminary result)   Collection Time: 11/25/14  7:00 PM  Result Value Ref Range Status   Specimen Description SPUTUM  Final   Special Requests NONE  Final   Gram Stain PENDING  Incomplete   Culture   Final    NORMAL OROPHARYNGEAL FLORA Performed at [facility]    Report Status PENDING  Incomplete    Radiology Reports Ct Chest W Contrast  11/26/2014   CLINICAL DATA:  Abnormal chest radiograph with question RIGHT hilar mass versus adenopathy and RIGHT lung infiltrates  EXAM: CT CHEST WITH CONTRAST  TECHNIQUE: Multidetector CT imaging of the chest was  performed during intravenous contrast administration. Sagittal and coronal MPR images reconstructed from axial data set.  CONTRAST:  32mL OMNIPAQUE IOHEXOL 300 MG/ML  SOLN IV  COMPARISON:  Chest radiograph 11/25/2014, CT chest 04/06/2011  FINDINGS: Atherosclerotic calcifications aorta and coronary arteries.  Aneurysmal dilatation ascending thoracic aorta 4.2 x 4.3 cm image 31.  Calcified subcarinal lymph node.  Additional minimally enlarged noncalcified subcarinal lymph node 11 mm short axis image 29.  Normal sized precarinal and prevascular lymph nodes.  Nonobstructing RIGHT renal calculi.  Small cyst upper pole LEFT kidney 2.4 x 2.3 cm image 63.  Post cholecystectomy.  Large RIGHT suprahilar mass invading the RIGHT hilum, 6.5 x 4.8 x 6.4 cm.  Mass occludes the RIGHT upper lobe bronchus.  Suspected confluent RIGHT hilar lymph nodes.  Extensive consolidation identified in the anterior segment of the RIGHT upper lobe with less consolidation seen in the RIGHT middle lobe, may represent postobstructive pneumonitis though lymphangitic tumor spread not completely excluded.  Stellate nodular density RIGHT upper lobe 8 mm diameter image 25 unchanged.  5 mm RIGHT lower lobe nodule image 48 unchanged.  Additional RIGHT lower lobe nodule 4 mm diameter image 53  unchanged.  7 mm LEFT lower lobe nodule image 43 probably unchanged 6 mm RIGHT lower lobe nodule image 38 stability uncertain.  Small RIGHT pleural effusion.  Underlying severe emphysematous changes greatest in upper lobes.  No definite osseous metastatic lesions.  IMPRESSION: Large medial RIGHT upper lobe suprahilar mass 6.5 x 4.8 6.4 cm invading the RIGHT hilum compatible prior brain neoplasm.  Probable confluent RIGHT hilar adenopathy.  Pulmonary nodules bilaterally, some of which are stable, additional which are of uncertain stability due to presence of infiltrates on previous exam.  Postobstructive pneumonitis involving the anterior segment RIGHT upper lobe and medial segment of RIGHT middle lobe, though lymphangitic tumor spread not excluded.   Electronically Signed   By: Lavonia Dana M.D.   On: 11/26/2014 11:21   Dg Chest Port 1 View  11/26/2014   CLINICAL DATA:  Subsequent evaluation right upper lobe mass and right upper lobe consolidation  EXAM: PORTABLE CHEST - 1 VIEW  COMPARISON:  11/26/2014, 11/25/2014  FINDINGS: Stable cardiac enlargement. Stable consolidation right upper lobe with right hilar fullness. Stable extensive right middle lobe infiltrate. Left lung appears clear.  IMPRESSION: No change from prior chest radiograph with known right lung mass, right upper lobe consolidation, and right middle lobe infiltrate.   Electronically Signed   By: Skipper Cliche M.D.   On: 11/26/2014 18:23   Dg Chest Port 1 View  11/25/2014   CLINICAL DATA:  Cough, shortness of breath and RIGHT-sided chest pain since 0300 hours today, history hypertension, COPD, coronary artery disease, atrial fibrillation, former smoker  EXAM: PORTABLE CHEST - 1 VIEW  COMPARISON:  04/05/2011; correlation CT chest 04/06/2011  FINDINGS: Normal heart size and pulmonary vascularity.  Enlargement of RIGHT hilum with questionable adenopathy versus suprahilar/perihilar mass.  Infiltrate identified in the RIGHT perihilar region and RIGHT  lower lobe, unable to exclude underlying mass lesion.  Remaining lungs emphysematous but clear.  No pleural effusion or pneumothorax.  IMPRESSION: COPD changes with RIGHT perihilar and lower lobe infiltrate infiltrate with RIGHT hilar enlargement suspicious for adenopathy or tumor.  Further evaluation by CT chest with contrast recommended to assess for potential malignancy.   Electronically Signed   By: Lavonia Dana M.D.   On: 11/25/2014 13:26    CBC  Recent Labs Lab 11/25/14 1220 11/26/14 0340 11/27/14 0113  WBC  14.4* 15.3* 10.8*  HGB 13.6 11.7* 10.2*  HCT 42.1 37.6* 32.1*  PLT 281 256 182  MCV 82.2 80.5 79.9  MCH 26.6 25.1* 25.4*  MCHC 32.3 31.1 31.8  RDW 14.2 14.5 14.5    Chemistries   Recent Labs Lab 11/25/14 1220 11/26/14 0340 11/27/14 0113  NA 134* 134* 134*  K 3.7 3.7 3.4*  CL 100 103 107  CO2 25 24 23   GLUCOSE 112* 91 100*  BUN 21 19 14   CREATININE 1.37* 1.32 1.05  CALCIUM 8.9 8.3* 8.0*   ------------------------------------------------------------------------------------------------------------------ estimated creatinine clearance is 65.5 mL/min (by C-G formula based on Cr of 1.05). ------------------------------------------------------------------------------------------------------------------ No results for input(s): HGBA1C in the last 72 hours. ------------------------------------------------------------------------------------------------------------------ No results for input(s): CHOL, HDL, LDLCALC, TRIG, CHOLHDL, LDLDIRECT in the last 72 hours. ------------------------------------------------------------------------------------------------------------------ No results for input(s): TSH, T4TOTAL, T3FREE, THYROIDAB in the last 72 hours.  Invalid input(s): FREET3 ------------------------------------------------------------------------------------------------------------------ No results for input(s): VITAMINB12, FOLATE, FERRITIN, TIBC, IRON, RETICCTPCT in  the last 72 hours.  Coagulation profile  Recent Labs Lab 11/25/14 1220  INR 1.13    No results for input(s): DDIMER in the last 72 hours.  Cardiac Enzymes  Recent Labs Lab 11/26/14 0920  TROPONINI <0.03   ------------------------------------------------------------------------------------------------------------------ Invalid input(s): POCBNP     Time Spent in minutes   30 minutes   Kimisha Eunice M.D on 11/27/2014 at 11:33 AM  Between 7am to 7pm - Pager - [phone]  After 7pm go to [email or web address] - password [email or web address]  And look for the night coverage person covering for me after hours  [facility] Office  [phone]   **Disclaimer: This note may have been dictated with voice recognition software. Similar sounding words can inadvertently be transcribed and this note may contain transcription errors which may not have been corrected upon publication of note.**

## 2014-11-27 NOTE — Progress Notes (Signed)
ANTICOAGULATION CONSULT NOTE - Initial Consult  Pharmacy Consult for heparin Indication: atrial fibrillation  Allergies  Allergen Reactions  . Codeine Itching    Patient Measurements: Height: 5\' 10"  (177.8 cm) Weight: 206 lb (93.441 kg) IBW/kg (Calculated) : 73 Heparin Dosing Weight: 92 kg  Vital Signs: Temp: 98.8 F (37.1 C) (02/22 0812) Temp Source: Oral (02/22 0812) BP: 110/98 mmHg (02/22 0812) Pulse Rate: 89 (02/22 0812)  Labs:  Recent Labs  11/25/14 1220 11/26/14 0340 11/26/14 0920 11/26/14 1535 11/27/14 0113 11/27/14 1118  HGB 13.6 11.7*  --   --  10.2*  --   HCT 42.1 37.6*  --   --  32.1*  --   PLT 281 256  --   --  182  --   APTT  --   --   --  39* 69* 56*  LABPROT 14.6  --   --   --   --   --   INR 1.13  --   --   --   --   --   HEPARINUNFRC  --   --   --  1.92* 0.99* 0.51  CREATININE 1.37* 1.32  --   --  1.05  --   TROPONINI  --   --  <0.03  --   --   --     Estimated Creatinine Clearance: 65.5 mL/min (by C-G formula based on Cr of 1.05).   Medical History: Past Medical History  Diagnosis Date  . Syncope   . COPD (chronic obstructive pulmonary disease)   . Chronic atrial fibrillation   . GERD (gastroesophageal reflux disease)   . Coronary artery disease   . Hypertension   . Kidney stone     Medications:  See EMR  Assessment: 81 yo m admitted on 2/20 for pneumonia. Patient has afib and is on chronic anticoagulation with Xarelto. Xarelto was resumed on admission (last dose 2/20 @ 1651). Pharmacy is consulted to begin heparin and discontinue Xarelto. Xarelto has the potential to affect the heparin level (anti-Xa level), will dose based on aPTT until Xarelto is no longer affecting heparin assay.    This morning, although HL is therapeutic, aPTT is SUBtherapeutic. Levels are not yet correlating. hgb slightly down, plts wnl, no s/sx bleeding  Goal of Therapy:  Heparin level 0.3-0.7 units/ml Monitor platelets by anticoagulation protocol: Yes   aPTT 66-102   Plan:  Increase heparin to 1200 units/hr Daily HL, CBC, aPTT Monitor for s/sx bleeding   Hughes Better, PharmD, BCPS Clinical Pharmacist Pager: [phone] 11/27/2014 12:52 PM

## 2014-11-27 NOTE — Consult Note (Signed)
Name: [name] MRN: [id number] DOB: Jun 03, 1933    ADMISSION DATE:  11/25/2014 CONSULTATION DATE:  2/22  REFERRING MD :  Elgergawy   CHIEF COMPLAINT: lung mass  BRIEF PATIENT DESCRIPTION:  81 year old male patient admitted on 2/20 with working diagnosis initially of community-acquired pneumonia, complicated by atrial fibrillation with rapid ventricular response. Further evaluation via CT scan demonstrated a large right medial upper lobe suprahilar lung mass. With what appeared to be postobstructive changes involving the right upper and middle lobe. PCCM was asked to see re: these CT findings.  SIGNIFICANT EVENTS   STUDIES:  CT chest 2/21: Large medial RIGHT upper lobe suprahilar mass 6.5 x 4.8 6.4 cm invading the RIGHT hilum compatible prior brain neoplasm.Probable confluent RIGHT hilar adenopathy.Pulmonary nodules bilaterally, some of which are stable, additional which are of uncertain stability due to presence of infiltrates on previous exam. Postobstructive pneumonitis involving the anterior segment RIGHT upper lobe and medial segment of RIGHT middle lobe, though lymphangitic tumor spread not excluded.   HISTORY OF PRESENT ILLNESS:   81 year old male she was admitted on 2/20 w/ CC: shortness of breath w/ associated right sided pleuritic chest discomfort, cough productive of yellow sputum, and subjective chills. On evaluation in the emergency room he was found to have atrial fibrillation with rapid ventricular response. Initial chest x-ray demonstrated right perihilar infiltrate, right lower lobe airspace disease. CT scan was recommended and obtained. This demonstrated a large right upper lobe suprahilar mass measuring 6.5 x 4.8 x 6.4 cm invading the right hilum. Also demonstrated findings consistent with postobstructive pneumonitis involving the right middle lobe, upper lobe.  He was admitted to the medical service, treated in the usual fashion with antibiotics, supplemental oxygen,  and supportive care. [facility] was asked to see in consult in regards to the newly identified lung mass.  PAST MEDICAL HISTORY :   has a past medical history of Syncope; COPD (chronic obstructive pulmonary disease); Chronic atrial fibrillation; GERD (gastroesophageal reflux disease); Coronary artery disease; Hypertension; and Kidney stone.  has past surgical history that includes Appendectomy; Cholecystectomy; and Esophageal dilation. Prior to Admission medications   Medication Sig Start Date End Date Taking? Authorizing Provider  albuterol (PROVENTIL HFA;VENTOLIN HFA) 108 (90 BASE) MCG/ACT inhaler Inhale 1-2 puffs into the lungs every 6 (six) hours as needed for wheezing or shortness of breath.   Yes Historical Provider, MD  budesonide-formoterol (SYMBICORT) 160-4.5 MCG/ACT inhaler Inhale 2 puffs into the lungs 2 (two) times daily.   Yes Historical Provider, MD  diltiazem (CARDIZEM) 90 MG tablet Take 90 mg by mouth 2 (two) times daily. Take two tablets in the morning and take one tablet in the afternoon.   Yes Historical Provider, MD  diphenhydramine-acetaminophen (TYLENOL PM) 25-500 MG TABS Take 2 tablets by mouth at bedtime as needed.   Yes Historical Provider, MD  famotidine (PEPCID) 20 MG tablet Take 20 mg by mouth daily as needed for heartburn or indigestion.   Yes Historical Provider, MD  furosemide (LASIX) 40 MG tablet Take 40 mg by mouth daily.   Yes Historical Provider, MD  rivaroxaban (XARELTO) 20 MG TABS tablet Take 20 mg by mouth daily with supper.   Yes Historical Provider, MD  zolpidem (AMBIEN) 10 MG tablet Take 5 mg by mouth at bedtime as needed for sleep.   Yes Historical Provider, MD   Allergies  Allergen Reactions  . Codeine Itching    FAMILY HISTORY:  family history includes Cancer in an other family member;  Heart attack in his sister; Stroke in an other family member. SOCIAL HISTORY:  reports that he has quit smoking. He does not have any smokeless tobacco  history on file. He reports that he drinks alcohol.  REVIEW OF SYSTEMS:   Constitutional:  fever, chills, weight loss, malaise/fatigue and diaphoresis.  HENT: Negative for hearing loss, ear pain, nosebleeds, congestion, sore throat, neck pain, tinnitus and ear discharge.   Eyes: Negative for blurred vision, double vision, photophobia, pain, discharge and redness.  Respiratory: Negative for cough, hemoptysis, sputum production, yellow, now improved, shortness of breath, wheezing and stridor.   Cardiovascular: Negative for chest pain, palpitations, orthopnea, claudication, leg swelling and PND.  Gastrointestinal: Negative for heartburn, nausea, vomiting, abdominal pain, diarrhea, constipation, blood in stool and melena.  Genitourinary: Negative for dysuria, urgency, frequency, hematuria and flank pain.  Musculoskeletal: Negative for myalgias, back pain, joint pain and falls.  Skin: Negative for itching and rash.  Neurological: Negative for dizziness, tingling, tremors, sensory change, speech change, focal weakness, seizures, loss of consciousness, weakness and headaches.  Endo/Heme/Allergies: Negative for environmental allergies and polydipsia. Does not bruise/bleed easily.  SUBJECTIVE: no distress   VITAL SIGNS: Temp:  [98.3 F (36.8 C)-99.1 F (37.3 C)] 98.6 F (37 C) (02/22 1300) Pulse Rate:  [85-139] 85 (02/22 1300) Resp:  [22-34] 24 (02/22 1300) BP: (101-155)/(40-98) 110/64 mmHg (02/22 1300) SpO2:  [90 %-99 %] 99 % (02/22 1300) FiO2 (%):  [32 %] 32 % (02/21 1700) Weight:  [93.441 kg (206 lb)] 93.441 kg (206 lb) (02/22 0354)  PHYSICAL EXAMINATION: General:  Well nourished white male, in no acute distress.  Neuro:  Awake, oriented, no focal def  HEENT:  [place], no JVD NCAT Cardiovascular:  Regular irreg  Lungs:  Decreased on right  Abdomen:  Soft, non-tender + bowel sounds  Musculoskeletal:  Intact  Skin:  Intact    Recent Labs Lab 11/25/14 1220 11/26/14 0340 11/27/14 0113    NA 134* 134* 134*  K 3.7 3.7 3.4*  CL 100 103 107  CO2 25 24 23   BUN 21 19 14   CREATININE 1.37* 1.32 1.05  GLUCOSE 112* 91 100*    Recent Labs Lab 11/25/14 1220 11/26/14 0340 11/27/14 0113  HGB 13.6 11.7* 10.2*  HCT 42.1 37.6* 32.1*  WBC 14.4* 15.3* 10.8*  PLT 281 256 182   Ct Chest W Contrast  11/26/2014   CLINICAL DATA:  Abnormal chest radiograph with question RIGHT hilar mass versus adenopathy and RIGHT lung infiltrates  EXAM: CT CHEST WITH CONTRAST  TECHNIQUE: Multidetector CT imaging of the chest was performed during intravenous contrast administration. Sagittal and coronal MPR images reconstructed from axial data set.  CONTRAST:  69mL OMNIPAQUE IOHEXOL 300 MG/ML  SOLN IV  COMPARISON:  Chest radiograph 11/25/2014, CT chest 04/06/2011  FINDINGS: Atherosclerotic calcifications aorta and coronary arteries.  Aneurysmal dilatation ascending thoracic aorta 4.2 x 4.3 cm image 31.  Calcified subcarinal lymph node.  Additional minimally enlarged noncalcified subcarinal lymph node 11 mm short axis image 29.  Normal sized precarinal and prevascular lymph nodes.  Nonobstructing RIGHT renal calculi.  Small cyst upper pole LEFT kidney 2.4 x 2.3 cm image 63.  Post cholecystectomy.  Large RIGHT suprahilar mass invading the RIGHT hilum, 6.5 x 4.8 x 6.4 cm.  Mass occludes the RIGHT upper lobe bronchus.  Suspected confluent RIGHT hilar lymph nodes.  Extensive consolidation identified in the anterior segment of the RIGHT upper lobe with less consolidation seen in the RIGHT middle lobe, may represent postobstructive pneumonitis  though lymphangitic tumor spread not completely excluded.  Stellate nodular density RIGHT upper lobe 8 mm diameter image 25 unchanged.  5 mm RIGHT lower lobe nodule image 48 unchanged.  Additional RIGHT lower lobe nodule 4 mm diameter image 53 unchanged.  7 mm LEFT lower lobe nodule image 43 probably unchanged 6 mm RIGHT lower lobe nodule image 38 stability uncertain.  Small RIGHT  pleural effusion.  Underlying severe emphysematous changes greatest in upper lobes.  No definite osseous metastatic lesions.  IMPRESSION: Large medial RIGHT upper lobe suprahilar mass 6.5 x 4.8 6.4 cm invading the RIGHT hilum compatible prior brain neoplasm.  Probable confluent RIGHT hilar adenopathy.  Pulmonary nodules bilaterally, some of which are stable, additional which are of uncertain stability due to presence of infiltrates on previous exam.  Postobstructive pneumonitis involving the anterior segment RIGHT upper lobe and medial segment of RIGHT middle lobe, though lymphangitic tumor spread not excluded.   Electronically Signed   By: Lavonia Dana M.D.   On: 11/26/2014 11:21   Dg Chest Port 1 View  11/26/2014   CLINICAL DATA:  Subsequent evaluation right upper lobe mass and right upper lobe consolidation  EXAM: PORTABLE CHEST - 1 VIEW  COMPARISON:  11/26/2014, 11/25/2014  FINDINGS: Stable cardiac enlargement. Stable consolidation right upper lobe with right hilar fullness. Stable extensive right middle lobe infiltrate. Left lung appears clear.  IMPRESSION: No change from prior chest radiograph with known right lung mass, right upper lobe consolidation, and right middle lobe infiltrate.   Electronically Signed   By: Skipper Cliche M.D.   On: 11/26/2014 18:23    ASSESSMENT / PLAN:  Right Upper Lobe Lung Mass RUL and RML post-obstructive PNA A Fib COPD  Discussion Very concerning for a malignancy. Discussed at length w/ pt. Would like to give him another day in hope that he can wean off the dilt gtt, and decrease O2 requirements.   Plan FOB tentatively scheduled for 230 pm on 2/24 NPO effective midnight on 24th  Hold heparin 6 hours prior to procedure Rate control and abx per primary service.    Erick Colace ACNP-BC [place] Pager # [phone] OR # [phone] if no answer 11/27/2014, 1:40 PM  Attending Note:  I have examined patient, reviewed labs, studies and  notes. I have discussed the case with Jerrye Bushy, and I agree with the data and plans as amended above. He presented with a RUL PNA and his CT scan shows a hilar mass. I believe he needs bronchoscopy and would like to do this as soon as we deem stable. I have scheduled him for Wednesday at 2:30. If he is not recovered adequately by then then we will postpone.   Baltazar Apo, MD, PhD 11/27/2014, 3:29 PM [facility] [phone] or if no answer [phone]

## 2014-11-27 NOTE — Progress Notes (Signed)
ANTICOAGULATION CONSULT NOTE - Follow Up Consult  Pharmacy Consult for heparin Indication: atrial fibrillation  Allergies  Allergen Reactions  . Codeine Itching    Patient Measurements: Height: 5\' 10"  (177.8 cm) Weight: 206 lb (93.441 kg) IBW/kg (Calculated) : 73 Heparin Dosing Weight: 92 kg  Vital Signs: Temp: 97.9 F (36.6 C) (02/22 1941) Temp Source: Oral (02/22 1941) BP: 107/57 mmHg (02/22 1941) Pulse Rate: 101 (02/22 1941)  Labs:  Recent Labs  11/25/14 1220 11/26/14 0340 11/26/14 0920  11/26/14 1535 11/27/14 0113 11/27/14 1118 11/27/14 2030  HGB 13.6 11.7*  --   --   --  10.2*  --   --   HCT 42.1 37.6*  --   --   --  32.1*  --   --   PLT 281 256  --   --   --  182  --   --   APTT  --   --   --   < > 39* 69* 56* 67*  LABPROT 14.6  --   --   --   --   --   --   --   INR 1.13  --   --   --   --   --   --   --   HEPARINUNFRC  --   --   --   --  1.92* 0.99* 0.51  --   CREATININE 1.37* 1.32  --   --   --  1.05  --   --   TROPONINI  --   --  <0.03  --   --   --   --   --   < > = values in this interval not displayed.  Estimated Creatinine Clearance: 65.5 mL/min (by C-G formula based on Cr of 1.05).   Medical History: Past Medical History  Diagnosis Date  . Syncope   . COPD (chronic obstructive pulmonary disease)   . Chronic atrial fibrillation   . GERD (gastroesophageal reflux disease)   . Coronary artery disease   . Hypertension   . Kidney stone     Medications:  See EMR  Assessment: 81 yo m admitted on 2/20 for pneumonia. Patient has afib and is on chronic anticoagulation with Xarelto. Xarelto was resumed on admission (last dose 2/20 @ 1651). Pharmacy is consulted to begin heparin and discontinue Xarelto. Xarelto has the potential to affect the heparin level (anti-Xa level), will dose based on aPTT until Xarelto is no longer affecting heparin assay.    Heparin drip 1200 uts/hr aPTT 67 sec at goal.   No bleeding noted.    Goal of Therapy:   Heparin level 0.3-0.7 units/ml Monitor platelets by anticoagulation protocol: Yes  aPTT 66-102   Plan:  Continue heparin to 1200 units/hr Daily HL, CBC, aPTT Monitor for s/sx bleeding   Bonnita Nasuti Pharm.D. CPP, BCPS Clinical Pharmacist [phone] 11/27/2014 9:54 PM

## 2014-11-28 DIAGNOSIS — J189 Pneumonia, unspecified organism: Secondary | ICD-10-CM

## 2014-11-28 LAB — CULTURE, RESPIRATORY

## 2014-11-28 LAB — HEPARIN LEVEL (UNFRACTIONATED): HEPARIN UNFRACTIONATED: 0.4 [IU]/mL (ref 0.30–0.70)

## 2014-11-28 LAB — BASIC METABOLIC PANEL
Anion gap: 8 (ref 5–15)
BUN: 10 mg/dL (ref 6–23)
CALCIUM: 8.3 mg/dL — AB (ref 8.4–10.5)
CO2: 21 mmol/L (ref 19–32)
CREATININE: 1.04 mg/dL (ref 0.50–1.35)
Chloride: 106 mmol/L (ref 96–112)
GFR calc Af Amer: 77 mL/min — ABNORMAL LOW (ref >90)
GFR, EST NON AFRICAN AMERICAN: 66 mL/min — AB (ref >90)
GLUCOSE: 102 mg/dL — AB (ref 70–99)
Potassium: 3.6 mmol/L (ref 3.5–5.1)
SODIUM: 135 mmol/L (ref 135–145)

## 2014-11-28 LAB — CULTURE, RESPIRATORY W GRAM STAIN: Culture: NORMAL

## 2014-11-28 LAB — CBC
HEMATOCRIT: 32.2 % — AB (ref 39.0–52.0)
HEMOGLOBIN: 9.9 g/dL — AB (ref 13.0–17.0)
MCH: 25 pg — ABNORMAL LOW (ref 26.0–34.0)
MCHC: 30.7 g/dL (ref 30.0–36.0)
MCV: 81.3 fL (ref 78.0–100.0)
Platelets: 191 10*3/uL (ref 150–400)
RBC: 3.96 MIL/uL — AB (ref 4.22–5.81)
RDW: 14.4 % (ref 11.5–15.5)
WBC: 9.9 10*3/uL (ref 4.0–10.5)

## 2014-11-28 LAB — APTT: aPTT: 71 seconds — ABNORMAL HIGH (ref 24–37)

## 2014-11-28 MED ORDER — METHYLPREDNISOLONE SODIUM SUCC 125 MG IJ SOLR
60.0000 mg | Freq: Two times a day (BID) | INTRAMUSCULAR | Status: DC
  Administered 2014-11-28 – 2014-12-01 (×7): 60 mg via INTRAVENOUS
  Filled 2014-11-28: qty 2
  Filled 2014-11-28 (×3): qty 0.96
  Filled 2014-11-28: qty 2
  Filled 2014-11-28 (×3): qty 0.96
  Filled 2014-11-28: qty 2

## 2014-11-28 MED ORDER — DIGOXIN 0.25 MG/ML IJ SOLN
0.2500 mg | Freq: Once | INTRAMUSCULAR | Status: AC
  Administered 2014-11-28: 0.25 mg via INTRAVENOUS
  Filled 2014-11-28: qty 1

## 2014-11-28 MED ORDER — LEVOFLOXACIN 750 MG PO TABS
750.0000 mg | ORAL_TABLET | Freq: Every day | ORAL | Status: DC
  Administered 2014-11-28 – 2014-12-01 (×4): 750 mg via ORAL
  Filled 2014-11-28 (×4): qty 1

## 2014-11-28 MED ORDER — SODIUM CHLORIDE 0.9 % IV BOLUS (SEPSIS)
250.0000 mL | Freq: Once | INTRAVENOUS | Status: AC
  Administered 2014-11-28: 250 mL via INTRAVENOUS

## 2014-11-28 MED ORDER — DIGOXIN 0.25 MG/ML IJ SOLN
0.2500 mg | Freq: Four times a day (QID) | INTRAMUSCULAR | Status: AC
  Administered 2014-11-28 (×2): 0.25 mg via INTRAVENOUS
  Filled 2014-11-28 (×2): qty 1

## 2014-11-28 MED ORDER — DILTIAZEM HCL 90 MG PO TABS
90.0000 mg | ORAL_TABLET | Freq: Three times a day (TID) | ORAL | Status: DC
  Administered 2014-11-28 – 2014-12-01 (×7): 90 mg via ORAL
  Filled 2014-11-28 (×12): qty 1

## 2014-11-28 MED ORDER — PANTOPRAZOLE SODIUM 40 MG PO TBEC
40.0000 mg | DELAYED_RELEASE_TABLET | Freq: Every day | ORAL | Status: DC
  Administered 2014-11-28 – 2014-11-30 (×3): 40 mg via ORAL
  Filled 2014-11-28 (×3): qty 1

## 2014-11-28 MED ORDER — SODIUM CHLORIDE 0.9 % IV BOLUS (SEPSIS)
500.0000 mL | Freq: Once | INTRAVENOUS | Status: AC
  Administered 2014-11-28: 500 mL via INTRAVENOUS

## 2014-11-28 MED ORDER — DILTIAZEM HCL 90 MG PO TABS
90.0000 mg | ORAL_TABLET | Freq: Two times a day (BID) | ORAL | Status: DC
  Filled 2014-11-28 (×3): qty 1

## 2014-11-28 NOTE — Progress Notes (Signed)
Patient Demographics  [name], is a 81 y.o. male, DOB - [birth year], TDS:[id number]  Admit date - 11/25/2014   Admitting Physician Verlee Monte, MD  Outpatient Primary MD for the patient is [name], MD  LOS - 3   Chief Complaint  Patient presents with  . Shortness of Breath      Admission history of present illness/brief narrative;  [name] is a 81 y.o. male, past medical history of chronic atrial fibrillation on xarelto (followed by a Dr. Wynonia Lawman), COPD, CAD, hypertension and reflux. He presented to the hospital with shortness of breath and right-sided chest pain pleuritic in nature.  He had productive cough of yellow phlegm. Was found to have A. fib with RVR at PCP office, so he was sent to ED . Patient had leukocytosis, A. fib with RVR heart rate in the 130s, patient was started on Cardizem drip, chest x-ray showing right-sided pneumonia, with right hilum enlargement , as well patient report pleuritic right-sided chest pain with cough, troponins were negative, no EKG changes, patient was admitted for community-acquired pneumonia treatment.started on IV levofloxacine, CT chest with IV contrast revealed right lung mass suspicious for malignancy, patient has been seen by pulmonary with plan for bronchoscopy/24 with biopsy.  Subjective:   [name] today has, No headache, No abdominal pain - No Nausea, No new weakness tingling or numbness, improvement of cough, pleuritic chest pain  and cough.  Assessment & Plan    Principal Problem:   Atrial fibrillation with RVR Active Problems:   CAP (community acquired pneumonia)   Dehydration   Acute renal failure   History of tobacco abuse   COPD (chronic obstructive pulmonary disease)   Abnormal CT of the chest   Patient nonadherence    Right lung mass - Most likely related to malignancy -  Hemolytic consult appreciated, plan is for bronchoscopy with biopsy in a.m. if patient remains stable - Continue to hold Xarelto , but continue with heparin gtt. Meanwhile.  A. fib with RVR - Most likely related to sepsis from community-acquired pneumonia. - Cardizem drip has been held given soft blood pressure, will load him with digoxin, resumed back on home dose oral Cardizem (if blood pressure allows) - On Xarelto at home, currently on heparin drip for possible procedure regarding lung mass. - Management as per cardiology, discussed with Dr. Wynonia Lawman  Acute on chronic hypoxic respiratory failure/ COPD exacerbation - Patient had significant wheezing and decreased air entry on physical exam today, will be started on IV Solu-Medrol - Continue with nebs  Chest pain - Appears to be pleuritic in nature secondary to cough. -EEG showing no significant ST abnormalities   Community acquired pneumonia/postobstructive pneumonia - Continue with levofloxacin - Follow On sputum culture   Dehydration - Continue to hold Lasix - Continue with IV fluid  Code Status: Full  Family Communication: Daughter at bedside  Disposition Plan: Remains on step down   Procedures  None   Consults   Cardiology Pulmonary  Medications  Scheduled Meds: . albuterol  2.5 mg Nebulization TID  . budesonide-formoterol  2 puff Inhalation BID  . digoxin  0.25 mg Intravenous Once  . diltiazem  15 mg Intravenous Once  . diltiazem  90 mg Oral BID  .  docusate sodium  100 mg Oral BID  . guaiFENesin  1,200 mg Oral BID  . levofloxacin  750 mg Oral Daily  . methylPREDNISolone (SOLU-MEDROL) injection  60 mg Intravenous Q12H  . pantoprazole  40 mg Oral Daily  . pneumococcal 23 valent vaccine  0.5 mL Intramuscular Tomorrow-1000  . sodium chloride  3 mL Intravenous Q12H   Continuous Infusions: . sodium chloride 75 mL/hr at 11/28/14 0550  . heparin 1,200 Units/hr (11/27/14 1300)   PRN Meds:.acetaminophen  **OR** acetaminophen, albuterol, alum & mag hydroxide-simeth, famotidine, morphine injection, ondansetron **OR** ondansetron (ZOFRAN) IV, oxyCODONE, polyvinyl alcohol, zolpidem  DVT Prophylaxis  on heparin drip  Lab Results  Component Value Date   PLT 191 11/28/2014    Antibiotics    Anti-infectives    Start     Dose/Rate Route Frequency Ordered Stop   11/28/14 1000  levofloxacin (LEVAQUIN) tablet 750 mg     750 mg Oral Daily 11/28/14 0904 12/04/14 0959   11/27/14 1500  levofloxacin (LEVAQUIN) IVPB 750 mg  Status:  Discontinued     750 mg 100 mL/hr over 90 Minutes Intravenous Every 48 hours 11/25/14 1621 11/27/14 1407   11/27/14 1500  levofloxacin (LEVAQUIN) IVPB 750 mg  Status:  Discontinued     750 mg 100 mL/hr over 90 Minutes Intravenous Daily 11/27/14 1407 11/27/14 1409   11/27/14 1500  levofloxacin (LEVAQUIN) IVPB 750 mg  Status:  Discontinued     750 mg 100 mL/hr over 90 Minutes Intravenous Daily 11/27/14 1409 11/28/14 0904   11/25/14 1630  levofloxacin (LEVAQUIN) IVPB 750 mg  Status:  Discontinued     750 mg 100 mL/hr over 90 Minutes Intravenous Every 24 hours 11/25/14 1618 11/25/14 1620   11/25/14 1415  levofloxacin (LEVAQUIN) IVPB 750 mg     750 mg 100 mL/hr over 90 Minutes Intravenous  Once 11/25/14 1403 11/25/14 1645          Objective:   Filed Vitals:   11/28/14 0535 11/28/14 0640 11/28/14 0828 11/28/14 0837  BP: 80/42 97/64  87/52  Pulse: 98 88  103  Temp:    97.9 F (36.6 C)  TempSrc:    Axillary  Resp: 25 24  29   Height:      Weight:      SpO2: 93% 98% 96% 94%    Wt Readings from Last 3 Encounters:  11/27/14 93.441 kg (206 lb)     Intake/Output Summary (Last 24 hours) at 11/28/14 0940 Last data filed at 11/28/14 0908  Gross per 24 hour  Intake   2832 ml  Output   1650 ml  Net   1182 ml     Physical Exam  Awake Alert, Oriented X 3, No new F.N deficits, Normal affect [place].AT,PERRAL Supple Neck,No JVD, No cervical lymphadenopathy  appriciated.  Symmetrical Chest wall movement, Good air movement bilaterally,  Irregular irregular,No Gallops,Rubs or new Murmurs, No Parasternal Heave +ve B.Sounds, Abd Soft, No tenderness, No organomegaly appriciated, No rebound - guarding or rigidity. No Cyanosis, Clubbing or edema, No new Rash or bruise     Data Review   Micro Results Recent Results (from the past 240 hour(s))  MRSA PCR Screening     Status: None   Collection Time: 11/25/14  4:12 PM  Result Value Ref Range Status   MRSA by PCR NEGATIVE NEGATIVE Final    Comment:        The GeneXpert MRSA Assay (FDA approved for NASAL specimens only), is one component of a comprehensive  MRSA colonization surveillance program. It is not intended to diagnose MRSA infection nor to guide or monitor treatment for MRSA infections.   Culture, sputum-assessment     Status: None   Collection Time: 11/25/14  7:00 PM  Result Value Ref Range Status   Specimen Description SPUTUM  Final   Special Requests NONE  Final   Sputum evaluation   Final    THIS SPECIMEN IS ACCEPTABLE. RESPIRATORY CULTURE REPORT TO FOLLOW.   Report Status 11/25/2014 FINAL  Final  Culture, respiratory (NON-Expectorated)     Status: None   Collection Time: 11/25/14  7:00 PM  Result Value Ref Range Status   Specimen Description SPUTUM  Final   Special Requests NONE  Final   Gram Stain   Final    RARE WBC PRESENT, PREDOMINANTLY PMN RARE SQUAMOUS EPITHELIAL CELLS PRESENT RARE YEAST RARE GRAM POSITIVE COCCI IN PAIRS IN CLUSTERS RARE GRAM NEGATIVE RODS    Culture   Final    NORMAL OROPHARYNGEAL FLORA Performed at [facility]    Report Status 11/28/2014 FINAL  Final    Radiology Reports Ct Chest W Contrast  11/26/2014   CLINICAL DATA:  Abnormal chest radiograph with question RIGHT hilar mass versus adenopathy and RIGHT lung infiltrates  EXAM: CT CHEST WITH CONTRAST  TECHNIQUE: Multidetector CT imaging of the chest was performed during intravenous  contrast administration. Sagittal and coronal MPR images reconstructed from axial data set.  CONTRAST:  2mL OMNIPAQUE IOHEXOL 300 MG/ML  SOLN IV  COMPARISON:  Chest radiograph 11/25/2014, CT chest 04/06/2011  FINDINGS: Atherosclerotic calcifications aorta and coronary arteries.  Aneurysmal dilatation ascending thoracic aorta 4.2 x 4.3 cm image 31.  Calcified subcarinal lymph node.  Additional minimally enlarged noncalcified subcarinal lymph node 11 mm short axis image 29.  Normal sized precarinal and prevascular lymph nodes.  Nonobstructing RIGHT renal calculi.  Small cyst upper pole LEFT kidney 2.4 x 2.3 cm image 63.  Post cholecystectomy.  Large RIGHT suprahilar mass invading the RIGHT hilum, 6.5 x 4.8 x 6.4 cm.  Mass occludes the RIGHT upper lobe bronchus.  Suspected confluent RIGHT hilar lymph nodes.  Extensive consolidation identified in the anterior segment of the RIGHT upper lobe with less consolidation seen in the RIGHT middle lobe, may represent postobstructive pneumonitis though lymphangitic tumor spread not completely excluded.  Stellate nodular density RIGHT upper lobe 8 mm diameter image 25 unchanged.  5 mm RIGHT lower lobe nodule image 48 unchanged.  Additional RIGHT lower lobe nodule 4 mm diameter image 53 unchanged.  7 mm LEFT lower lobe nodule image 43 probably unchanged 6 mm RIGHT lower lobe nodule image 38 stability uncertain.  Small RIGHT pleural effusion.  Underlying severe emphysematous changes greatest in upper lobes.  No definite osseous metastatic lesions.  IMPRESSION: Large medial RIGHT upper lobe suprahilar mass 6.5 x 4.8 6.4 cm invading the RIGHT hilum compatible prior brain neoplasm.  Probable confluent RIGHT hilar adenopathy.  Pulmonary nodules bilaterally, some of which are stable, additional which are of uncertain stability due to presence of infiltrates on previous exam.  Postobstructive pneumonitis involving the anterior segment RIGHT upper lobe and medial segment of RIGHT middle  lobe, though lymphangitic tumor spread not excluded.   Electronically Signed   By: Lavonia Dana M.D.   On: 11/26/2014 11:21   Dg Chest Port 1 View  11/26/2014   CLINICAL DATA:  Subsequent evaluation right upper lobe mass and right upper lobe consolidation  EXAM: PORTABLE CHEST - 1 VIEW  COMPARISON:  11/26/2014,  11/25/2014  FINDINGS: Stable cardiac enlargement. Stable consolidation right upper lobe with right hilar fullness. Stable extensive right middle lobe infiltrate. Left lung appears clear.  IMPRESSION: No change from prior chest radiograph with known right lung mass, right upper lobe consolidation, and right middle lobe infiltrate.   Electronically Signed   By: Skipper Cliche M.D.   On: 11/26/2014 18:23    CBC  Recent Labs Lab 11/25/14 1220 11/26/14 0340 11/27/14 0113 11/28/14 0339  WBC 14.4* 15.3* 10.8* 9.9  HGB 13.6 11.7* 10.2* 9.9*  HCT 42.1 37.6* 32.1* 32.2*  PLT 281 256 182 191  MCV 82.2 80.5 79.9 81.3  MCH 26.6 25.1* 25.4* 25.0*  MCHC 32.3 31.1 31.8 30.7  RDW 14.2 14.5 14.5 14.4    Chemistries   Recent Labs Lab 11/25/14 1220 11/26/14 0340 11/27/14 0113 11/28/14 0339  NA 134* 134* 134* 135  K 3.7 3.7 3.4* 3.6  CL 100 103 107 106  CO2 25 24 23 21   GLUCOSE 112* 91 100* 102*  BUN 21 19 14 10   CREATININE 1.37* 1.32 1.05 1.04  CALCIUM 8.9 8.3* 8.0* 8.3*   ------------------------------------------------------------------------------------------------------------------ estimated creatinine clearance is 66.1 mL/min (by C-G formula based on Cr of 1.04). ------------------------------------------------------------------------------------------------------------------ No results for input(s): HGBA1C in the last 72 hours. ------------------------------------------------------------------------------------------------------------------ No results for input(s): CHOL, HDL, LDLCALC, TRIG, CHOLHDL, LDLDIRECT in the last 72  hours. ------------------------------------------------------------------------------------------------------------------ No results for input(s): TSH, T4TOTAL, T3FREE, THYROIDAB in the last 72 hours.  Invalid input(s): FREET3 ------------------------------------------------------------------------------------------------------------------ No results for input(s): VITAMINB12, FOLATE, FERRITIN, TIBC, IRON, RETICCTPCT in the last 72 hours.  Coagulation profile  Recent Labs Lab 11/25/14 1220  INR 1.13    No results for input(s): DDIMER in the last 72 hours.  Cardiac Enzymes  Recent Labs Lab 11/26/14 0920  TROPONINI <0.03   ------------------------------------------------------------------------------------------------------------------ Invalid input(s): POCBNP     Time Spent in minutes   30 minutes   Dimonique Bourdeau M.D on 11/28/2014 at 9:40 AM  Between 7am to 7pm - Pager - [phone]  After 7pm go to [email or web address] - password [email or web address]  And look for the night coverage person covering for me after hours  [facility] Office  [phone]   **Disclaimer: This note may have been dictated with voice recognition software. Similar sounding words can inadvertently be transcribed and this note may contain transcription errors which may not have been corrected upon publication of note.**

## 2014-11-28 NOTE — Progress Notes (Signed)
Name: ROBERTS BON MRN: [id number] DOB: 30-Mar-1933    ADMISSION DATE:  11/25/2014 CONSULTATION DATE:  2/22  REFERRING MD :  Elgergawy   CHIEF COMPLAINT: lung mass  BRIEF PATIENT DESCRIPTION:  81 year old male patient admitted on 2/20 with working diagnosis initially of community-acquired pneumonia, complicated by atrial fibrillation with rapid ventricular response. Further evaluation via CT scan demonstrated a large right medial upper lobe suprahilar lung mass. With what appeared to be postobstructive changes involving the right upper and middle lobe. PCCM was asked to see re: these CT findings.  SIGNIFICANT EVENTS   STUDIES:  CT chest 2/21: Large medial RIGHT upper lobe suprahilar mass 6.5 x 4.8 6.4 cm invading the RIGHT hilum compatible prior brain neoplasm.Probable confluent RIGHT hilar adenopathy.Pulmonary nodules bilaterally, some of which are stable, additional which are of uncertain stability due to presence of infiltrates on previous exam. Postobstructive pneumonitis involving the anterior segment RIGHT upper lobe and medial segment of RIGHT middle lobe, though lymphangitic tumor spread not excluded.   HISTORY OF PRESENT ILLNESS:   81 year old male she was admitted on 2/20 w/ CC: shortness of breath w/ associated right sided pleuritic chest discomfort, cough productive of yellow sputum, and subjective chills. On evaluation in the emergency room he was found to have atrial fibrillation with rapid ventricular response. Initial chest x-ray demonstrated right perihilar infiltrate, right lower lobe airspace disease. CT scan was recommended and obtained. This demonstrated a large right upper lobe suprahilar mass measuring 6.5 x 4.8 x 6.4 cm invading the right hilum. Also demonstrated findings consistent with postobstructive pneumonitis involving the right middle lobe, upper lobe.  He was admitted to the medical service, treated in the usual fashion with antibiotics, supplemental oxygen,  and supportive care. [facility] was asked to see in consult in regards to the newly identified lung mass.  SUBJECTIVE:  Comfortable up to chair  VITAL SIGNS: Temp:  [97.9 F (36.6 C)-98.7 F (37.1 C)] 97.9 F (36.6 C) (02/23 0837) Pulse Rate:  [83-106] 103 (02/23 0837) Resp:  [24-31] 29 (02/23 0837) BP: (80-131)/(42-79) 87/52 mmHg (02/23 0837) SpO2:  [85 %-99 %] 94 % (02/23 0837)  PHYSICAL EXAMINATION: General:  Well nourished white male, in no acute distress.  Neuro:  Awake, oriented, no focal def  HEENT:  , no JVD NCAT Cardiovascular:  Regular irreg  Lungs:  Decreased on right  Abdomen:  Soft, non-tender + bowel sounds  Musculoskeletal:  Intact  Skin:  Intact    Recent Labs Lab 11/26/14 0340 11/27/14 0113 11/28/14 0339  NA 134* 134* 135  K 3.7 3.4* 3.6  CL 103 107 106  CO2 24 23 21   BUN 19 14 10   CREATININE 1.32 1.05 1.04  GLUCOSE 91 100* 102*    Recent Labs Lab 11/26/14 0340 11/27/14 0113 11/28/14 0339  HGB 11.7* 10.2* 9.9*  HCT 37.6* 32.1* 32.2*  WBC 15.3* 10.8* 9.9  PLT 256 182 191   Dg Chest Port 1 View  11/26/2014   CLINICAL DATA:  Subsequent evaluation right upper lobe mass and right upper lobe consolidation  EXAM: PORTABLE CHEST - 1 VIEW  COMPARISON:  11/26/2014, 11/25/2014  FINDINGS: Stable cardiac enlargement. Stable consolidation right upper lobe with right hilar fullness. Stable extensive right middle lobe infiltrate. Left lung appears clear.  IMPRESSION: No change from prior chest radiograph with known right lung mass, right upper lobe consolidation, and right middle lobe infiltrate.   Electronically Signed   By: Skipper Cliche M.D.   On:  11/26/2014 18:23    ASSESSMENT / PLAN:  Right Upper Lobe Lung Mass RUL and RML post-obstructive PNA A Fib COPD  Discussion Discussed risks and benefits of FOB with pt. I suspect R hilar lesion is lung CA. His dilt has been weaned to off, will plan to stop his heparin at 08:00 on 2/24, NPO  after a light breakfast  Plan FOB scheduled for 230 pm on 2/24 NPO except light breakfast 2/24 Hold heparin at 08:00 2/24   Baltazar Apo, MD, PhD 11/28/2014, 11:36 AM [facility] [phone] or if no answer [phone]

## 2014-11-28 NOTE — Progress Notes (Addendum)
ANTICOAGULATION CONSULT NOTE - Initial Consult  Pharmacy Consult for heparin Indication: atrial fibrillation  Allergies  Allergen Reactions  . Codeine Itching    Patient Measurements: Height: 5\' 10"  (177.8 cm) Weight: 206 lb (93.441 kg) IBW/kg (Calculated) : 73 Heparin Dosing Weight: 92 kg  Vital Signs: Temp: 97.9 F (36.6 C) (02/23 0837) Temp Source: Axillary (02/23 0837) BP: 87/52 mmHg (02/23 0837) Pulse Rate: 103 (02/23 0837)  Labs:  Recent Labs  11/25/14 1220 11/26/14 0340 11/26/14 0920  11/27/14 0113 11/27/14 1118 11/27/14 2030 11/28/14 0339  HGB 13.6 11.7*  --   --  10.2*  --   --  9.9*  HCT 42.1 37.6*  --   --  32.1*  --   --  32.2*  PLT 281 256  --   --  182  --   --  191  APTT  --   --   --   < > 69* 56* 67* 71*  LABPROT 14.6  --   --   --   --   --   --   --   INR 1.13  --   --   --   --   --   --   --   HEPARINUNFRC  --   --   --   < > 0.99* 0.51  --  0.40  CREATININE 1.37* 1.32  --   --  1.05  --   --  1.04  TROPONINI  --   --  <0.03  --   --   --   --   --   < > = values in this interval not displayed.  Estimated Creatinine Clearance: 66.1 mL/min (by C-G formula based on Cr of 1.04).   Medical History: Past Medical History  Diagnosis Date  . Syncope   . COPD (chronic obstructive pulmonary disease)   . Chronic atrial fibrillation   . GERD (gastroesophageal reflux disease)   . Coronary artery disease   . Hypertension   . Kidney stone     Medications:  See EMR  Assessment: 81 yo m admitted on 2/20 for pneumonia. Patient has afib and is on chronic anticoagulation with Xarelto. Xarelto was resumed on admission (last dose 2/20 @ 1651). Pharmacy is consulted to begin heparin and discontinue Xarelto.  HL and aptt are both therapeutic this morning, will discontinue aptt since xarelto is likely out of system by now with last dose on 2/20. CBC stable, no s/sx  Bleeding.    Goal of Therapy:  Heparin level 0.3-0.7 units/ml Monitor platelets by  anticoagulation protocol: Yes    Plan:  Continue heparin at 1200 units/hr Daily HL, CBC Monitor for s/sx bleeding Stop heparin on 2/24 at 0800 for procedure   Hughes Better, PharmD, BCPS Clinical Pharmacist Pager: [phone] 11/28/2014 8:59 AM

## 2014-11-28 NOTE — Progress Notes (Signed)
Hypotensive throughout the night. At 2349, he was 86/44 with a recheck of 82/47. Cardizem gtt stopped and NP notified. NS 500cc bolus ordered and received. BP up to 121/62 after bolus. Notified NP and orders to restart cardizem gtt at 6ml/hr. BP dropped again at 0530. BP was 91/50 with a recheck of 80/42. Cardizem gtt stopped again. NP notified. NS 250cc bolus ordered and received. Orders to leave cardizem gtt on hold for now. Recheck BP is 97/64. Patient resting comfortably at this time. Will report to AM nurse and continue to monitor.   M.Forest Gleason, RN

## 2014-11-28 NOTE — Progress Notes (Signed)
Subjective:  Patient seen and course reviewed.  Chest pain is a lot better and responding to treatment for pneumonia. Concern for malignancy as underlying cause.  Objective:  Vital Signs in the last 24 hours: BP 113/82 mmHg  Pulse 101  Temp(Src) 97.8 F (36.6 C) (Oral)  Resp 24  Ht 5\' 10"  (1.778 m)  Wt 93.441 kg (206 lb)  BMI 29.56 kg/m2  SpO2 96%  Physical Exam: Pleasant WM in NAD Lungs:  Decreased BS right lung Cardiac:  irregular rhythm, normal S1 and S2, no S3 Abdomen:  Soft, nontender, no masses Extremities:  No edema present  Intake/Output from previous day: 02/22 0701 - 02/23 0700 In: 2944 [P.O.:680; I.V.:2114; IV Piggyback:150] Out: 1200 [Urine:1200] Weight Filed Weights   11/25/14 1601 11/26/14 0342 11/27/14 0354  Weight: 79.6 kg (175 lb 7.8 oz) 91.536 kg (201 lb 12.8 oz) 93.441 kg (206 lb)    Lab Results: Basic Metabolic Panel:  Recent Labs  11/27/14 0113 11/28/14 0339  NA 134* 135  K 3.4* 3.6  CL 107 106  CO2 23 21  GLUCOSE 100* 102*  BUN 14 10  CREATININE 1.05 1.04    CBC:  Recent Labs  11/27/14 0113 11/28/14 0339  WBC 10.8* 9.9  HGB 10.2* 9.9*  HCT 32.1* 32.2*  MCV 79.9 81.3  PLT 182 191    BNP    Component Value Date/Time   BNP 119.9* 11/25/2014 1220   Telemetry: Atrial fibrillation rate rapid at times  Assessment/Plan:  1.  Atrial fibrillation with somewhat rapid response 2.  Postobstructive pneumonia 3.  Concern for lung cancer  Recommendations:  Discussed with hospitalist earlier today.  I recommended digoxin to help with rate control.  In addition I would restart his home diltiazem dose that also will help with rate control.  He was on 180 mg in the morning and 90 mg in the afternoon.    Kerry Hough  MD [facility]  11/28/2014, 1:13 PM

## 2014-11-29 ENCOUNTER — Encounter (HOSPITAL_COMMUNITY)
Admission: EM | Disposition: A | Discharge status: Home / Self Care | Payer: Self-pay | Source: Home / Self Care | Attending: Internal Medicine

## 2014-11-29 ENCOUNTER — Inpatient Hospital Stay (HOSPITAL_COMMUNITY): Payer: Medicare Other

## 2014-11-29 DIAGNOSIS — J440 Chronic obstructive pulmonary disease with acute lower respiratory infection: Secondary | ICD-10-CM

## 2014-11-29 DIAGNOSIS — C3490 Malignant neoplasm of unspecified part of unspecified bronchus or lung: Secondary | ICD-10-CM

## 2014-11-29 DIAGNOSIS — J189 Pneumonia, unspecified organism: Secondary | ICD-10-CM

## 2014-11-29 DIAGNOSIS — A419 Sepsis, unspecified organism: Principal | ICD-10-CM

## 2014-11-29 DIAGNOSIS — R938 Abnormal findings on diagnostic imaging of other specified body structures: Secondary | ICD-10-CM

## 2014-11-29 HISTORY — DX: Malignant neoplasm of unspecified part of unspecified bronchus or lung: C34.90

## 2014-11-29 HISTORY — PX: VIDEO BRONCHOSCOPY: SHX5072

## 2014-11-29 LAB — BASIC METABOLIC PANEL
ANION GAP: 7 (ref 5–15)
BUN: 12 mg/dL (ref 6–23)
CO2: 23 mmol/L (ref 19–32)
CREATININE: 1.02 mg/dL (ref 0.50–1.35)
Calcium: 8.6 mg/dL (ref 8.4–10.5)
Chloride: 108 mmol/L (ref 96–112)
GFR calc Af Amer: 79 mL/min — ABNORMAL LOW (ref >90)
GFR calc non Af Amer: 68 mL/min — ABNORMAL LOW (ref >90)
Glucose, Bld: 164 mg/dL — ABNORMAL HIGH (ref 70–99)
POTASSIUM: 3.7 mmol/L (ref 3.5–5.1)
Sodium: 138 mmol/L (ref 135–145)

## 2014-11-29 LAB — CBC
HEMATOCRIT: 30 % — AB (ref 39.0–52.0)
Hemoglobin: 9.3 g/dL — ABNORMAL LOW (ref 13.0–17.0)
MCH: 24.7 pg — ABNORMAL LOW (ref 26.0–34.0)
MCHC: 31 g/dL (ref 30.0–36.0)
MCV: 79.8 fL (ref 78.0–100.0)
Platelets: 165 10*3/uL (ref 150–400)
RBC: 3.76 MIL/uL — ABNORMAL LOW (ref 4.22–5.81)
RDW: 14.5 % (ref 11.5–15.5)
WBC: 7.9 10*3/uL (ref 4.0–10.5)

## 2014-11-29 LAB — HEPARIN LEVEL (UNFRACTIONATED): Heparin Unfractionated: <0.1 IU/mL — ABNORMAL LOW (ref 0.30–0.70)

## 2014-11-29 SURGERY — VIDEO BRONCHOSCOPY WITHOUT FLUORO
Anesthesia: Moderate Sedation | Laterality: Bilateral

## 2014-11-29 MED ORDER — LIDOCAINE HCL 2 % EX GEL
1.0000 "application " | Freq: Once | CUTANEOUS | Status: DC
  Filled 2014-11-29: qty 5

## 2014-11-29 MED ORDER — SODIUM CHLORIDE 0.9 % IV SOLN: INTRAVENOUS | Status: DC

## 2014-11-29 MED ORDER — MIDAZOLAM HCL 10 MG/2ML IJ SOLN
INTRAMUSCULAR | Status: DC | PRN
  Administered 2014-11-29: 1 mg via INTRAVENOUS
  Administered 2014-11-29 (×2): 2 mg via INTRAVENOUS

## 2014-11-29 MED ORDER — LIDOCAINE HCL 2 % EX GEL
CUTANEOUS | Status: DC | PRN
  Administered 2014-11-29: 1

## 2014-11-29 MED ORDER — PHENYLEPHRINE HCL 0.25 % NA SOLN
NASAL | Status: DC | PRN
  Administered 2014-11-29: 2 via NASAL

## 2014-11-29 MED ORDER — FENTANYL CITRATE 0.05 MG/ML IJ SOLN
INTRAMUSCULAR | Status: DC | PRN
  Administered 2014-11-29: 25 ug via INTRAVENOUS
  Administered 2014-11-29: 50 ug via INTRAVENOUS

## 2014-11-29 MED ORDER — MIDAZOLAM HCL 5 MG/ML IJ SOLN
INTRAMUSCULAR | Status: AC
  Filled 2014-11-29: qty 2

## 2014-11-29 MED ORDER — GUAIFENESIN ER 600 MG PO TB12: 600.0000 mg | ORAL_TABLET | Freq: Two times a day (BID) | ORAL | Status: DC

## 2014-11-29 MED ORDER — LIDOCAINE HCL (PF) 1 % IJ SOLN
INTRAMUSCULAR | Status: DC | PRN
  Administered 2014-11-29: 6 mL

## 2014-11-29 MED ORDER — FENTANYL CITRATE 0.05 MG/ML IJ SOLN
INTRAMUSCULAR | Status: AC
  Filled 2014-11-29: qty 4

## 2014-11-29 MED ORDER — PHENYLEPHRINE HCL 0.25 % NA SOLN
1.0000 | Freq: Four times a day (QID) | NASAL | Status: DC | PRN
  Filled 2014-11-29: qty 15

## 2014-11-29 MED ORDER — HEPARIN (PORCINE) IN NACL 100-0.45 UNIT/ML-% IJ SOLN
1300.0000 [IU]/h | INTRAMUSCULAR | Status: DC
  Administered 2014-11-29 – 2014-12-01 (×3): 1200 [IU]/h via INTRAVENOUS
  Filled 2014-11-29 (×3): qty 250

## 2014-11-29 NOTE — Op Note (Signed)
Video Bronchoscopy Procedure Note  Date of Operation: 11/29/2014  Pre-op Diagnosis: R hilar mass  Post-op Diagnosis: RUL occlusion from exophytic mass  Surgeon: Baltazar Apo  Assistants: none  Anesthesia: conscious sedation, moderate sedation  Meds Given: fentanyl 74mcg, versed 5mg  in divided doses, epi 1:10000 dil 10cc total, 1% lidocaine 25cc total  Operation: Flexible video fiberoptic bronchoscopy and biopsies.  Estimated Blood Loss: 34VQ  Complications: none noted  Indications and History: [name] is 81 y.o. with history of tobacco use. He was admitted with a RUL PNA and found to have a R hilar mass on CT chest.  Recommendation was to perform video fiberoptic bronchoscopy with biopsies. The risks, benefits, complications, treatment options and expected outcomes were discussed with the patient.  The possibilities of pneumothorax, pneumonia, reaction to medication, pulmonary aspiration, perforation of a viscus, bleeding, failure to diagnose a condition and creating a complication requiring transfusion or operation were discussed with the patient who freely signed the consent.    Description of Procedure: The patient was seen in the Preoperative Area, was examined and was deemed appropriate to proceed.  The patient was taken to [facility], identified as [name] and the procedure verified as Flexible Video Fiberoptic Bronchoscopy.  A Time Out was held and the above information confirmed.   Conscious sedation was initiated as indicated above. The video fiberoptic bronchoscope was introduced via the R nare and a general inspection was performed which showed normal cords, normal trachea, normal main carina. The R sided airways were inspected and showed an white exophytic friable mass occluding the RUL airway. There was also a small rounded raised are of abnormal mucosa at the RML orifice that was suspicious for neoplasm. The RLL airways all appeared to be normal. The L  side was then inspected. The LLL, Lingular and LUL airways were normal.   1:10000 epi was injected onto the RUL lesion. Endobronchial brushings and endobronchial forceps biopsies were performed at both the RUL and the RML orifice. There was some initial moderate bleeding that stopped quickly. The patient tolerated the procedure well. The bronchoscope was removed. There were no obvious complications.   Samples: 1. Endobronchial brushings from RUL mass 2. Endobronchial brushings from RML orifice lesion 3. Endobronchial forceps biopsies from RUL mass 4. Endobronchial forceps biopsies from RML orifice  lesion  Plans:  We will review the cytology, pathology results with the patient when they become available.  He will need a staging workup and oncology / radiation oncology referral.    Baltazar Apo, MD, PhD 11/29/2014, 3:35 PM [facility] [phone] or if no answer [phone]

## 2014-11-29 NOTE — Progress Notes (Signed)
Video bronchoscopy with intervention brushing, intervention biospy. Pt. Did well thru out procedure. Called gave report to Latricia Heft, RN.   Baltazar Apo, MD, PhD 11/30/2014, 2:56 PM [facility] [phone] or if no answer [phone]

## 2014-11-29 NOTE — Progress Notes (Signed)
Patient Demographics  [name], is a 81 y.o. male, DOB - [birth year], BWI:[id number]  Admit date - 11/25/2014   Admitting Physician Verlee Monte, MD  Outpatient Primary MD for the patient is [name], MD  LOS - 4   Chief Complaint  Patient presents with  . Shortness of Breath      Admission history of present illness/brief narrative;  [name] is a 81 y.o. male, past medical history of chronic atrial fibrillation on xarelto (followed by a Dr. Wynonia Lawman), COPD, CAD, hypertension and reflux. He presented to the hospital with shortness of breath and right-sided chest pain pleuritic in nature.  He had productive cough of yellow phlegm. Was found to have A. fib with RVR at PCP office, so he was sent to ED . Patient had leukocytosis, A. fib with RVR heart rate in the 130s, patient was started on Cardizem drip, chest x-ray showing right-sided pneumonia, with right hilum enlargement , as well patient report pleuritic right-sided chest pain with cough, troponins were negative, no EKG changes, patient was admitted for community-acquired pneumonia treatment.started on IV levofloxacine, CT chest with IV contrast revealed right lung mass suspicious for malignancy, patient has been seen by pulmonary with plan for bronchoscopy/24 with biopsy.  Subjective:   [name] improvement in air movement and cough. Denies CP, palpitations or major complaints of SOB.  Assessment & Plan    Principal Problem:   Pneumonia, organism unspecified Active Problems:   Atrial fibrillation with RVR   CAP (community acquired pneumonia)   Acute renal failure   History of tobacco abuse   COPD (chronic obstructive pulmonary disease)   Abnormal CT of the chest    Right lung mass - Most likely related to malignancy - pulmonology consult appreciated, plan is for bronchoscopy with  biopsy later today 2/24 - Continue to hold Xarelto , but continue with heparin gtt. Meanwhile. -will follow results and recommendations  A. fib with RVR - Most likely related to sepsis from community-acquired pneumonia. - Management as per cardiology, discussed with Dr. Wynonia Lawman -rate now controlled -continue cardizem and digoxin -BP soft, but stable and patient asymptomatic  Acute on chronic hypoxic respiratory failure/ COPD exacerbation -improve air movement, less wheezing -will continue steroids, Continue nebs, continue antibiotics -will also continue mucinex and start flutter valve  Chest pain - Appears to be pleuritic in nature secondary to cough. -EKG showing no significant ST abnormalities -cardiology on board; will follow rec's  Community acquired pneumonia/postobstructive pneumonia/early sepsis - Continue with levofloxacin - Follow On sputum culture -no fever, breathing better and with normal WBC's  Dehydration - Continue to hold Lasix - continue gentle hydration for another 24 hours  Code Status: Full  Family Communication: no family at bedside  Disposition Plan: Remains on step down   Procedures  FOB on 11/29/14:pending  2-D echo 11/26/14: - Left ventricle: The cavity size was normal. Wall thickness was increased in a pattern of moderate LVH. Systolic function was normal. The estimated ejection fraction was in the range of 50% to 55%. - Aortic valve: Valve area (Vmax): 3.12 cm^2. - Left atrium: The atrium was mildly dilated. - Right atrium: The atrium was mildly dilated. - Atrial septum: No defect or patent foramen ovale was identified.  Consults   Cardiology Pulmonary  Medications  Scheduled Meds: . albuterol  2.5 mg Nebulization TID  . budesonide-formoterol  2 puff Inhalation BID  . diltiazem  90 mg Oral 3 times per day  . docusate sodium  100 mg Oral BID  . guaiFENesin  1,200 mg Oral BID  . levofloxacin  750 mg Oral Daily  . lidocaine  1  application Topical Once  . methylPREDNISolone (SOLU-MEDROL) injection  60 mg Intravenous Q12H  . pantoprazole  40 mg Oral Daily  . pneumococcal 23 valent vaccine  0.5 mL Intramuscular Tomorrow-1000  . sodium chloride  3 mL Intravenous Q12H   Continuous Infusions: . sodium chloride 50 mL/hr (11/29/14 2002)  . sodium chloride    . heparin     PRN Meds:.acetaminophen **OR** acetaminophen, albuterol, alum & mag hydroxide-simeth, famotidine, morphine injection, ondansetron **OR** ondansetron (ZOFRAN) IV, oxyCODONE, phenylephrine, polyvinyl alcohol, zolpidem  DVT Prophylaxis  on heparin drip  Lab Results  Component Value Date   PLT 165 11/29/2014    Antibiotics    Anti-infectives    Start     Dose/Rate Route Frequency Ordered Stop   11/28/14 1000  levofloxacin (LEVAQUIN) tablet 750 mg     750 mg Oral Daily 11/28/14 0904 12/04/14 0959   11/27/14 1500  levofloxacin (LEVAQUIN) IVPB 750 mg  Status:  Discontinued     750 mg 100 mL/hr over 90 Minutes Intravenous Every 48 hours 11/25/14 1621 11/27/14 1407   11/27/14 1500  levofloxacin (LEVAQUIN) IVPB 750 mg  Status:  Discontinued     750 mg 100 mL/hr over 90 Minutes Intravenous Daily 11/27/14 1407 11/27/14 1409   11/27/14 1500  levofloxacin (LEVAQUIN) IVPB 750 mg  Status:  Discontinued     750 mg 100 mL/hr over 90 Minutes Intravenous Daily 11/27/14 1409 11/28/14 0904   11/25/14 1630  levofloxacin (LEVAQUIN) IVPB 750 mg  Status:  Discontinued     750 mg 100 mL/hr over 90 Minutes Intravenous Every 24 hours 11/25/14 1618 11/25/14 1620   11/25/14 1415  levofloxacin (LEVAQUIN) IVPB 750 mg     750 mg 100 mL/hr over 90 Minutes Intravenous  Once 11/25/14 1403 11/25/14 1645        Objective:   Filed Vitals:   11/29/14 1615 11/29/14 1935 11/29/14 1940 11/29/14 2100  BP: 108/61   109/60  Pulse: 70   87  Temp: 98.3 F (36.8 C)   97.5 F (36.4 C)  TempSrc: Oral   Oral  Resp: 21   38  Height:      Weight:      SpO2: 96% 96% 97% 98%     Wt Readings from Last 3 Encounters:  11/29/14 97 kg (213 lb 13.5 oz)     Intake/Output Summary (Last 24 hours) at 11/29/14 2140 Last data filed at 11/29/14 1302  Gross per 24 hour  Intake   1470 ml  Output   1300 ml  Net    170 ml     Physical Exam Gen: Awake Alert, Oriented X 3, No focal motor or sensory deficits; feeling better and breathing easier. Lungs: no crackles; positive exp wheezing and diminish BS at bases  Heart: positive SEM, no rubs or gallops; no edema; rate controlled Abd: soft, NT, ND, positive BS Extremities:No Cyanosis, Clubbing or edema, No new Rash or bruise     Data Review   Micro Results Recent Results (from the past 240 hour(s))  MRSA PCR Screening     Status: None   Collection  Time: 11/25/14  4:12 PM  Result Value Ref Range Status   MRSA by PCR NEGATIVE NEGATIVE Final    Comment:        The GeneXpert MRSA Assay (FDA approved for NASAL specimens only), is one component of a comprehensive MRSA colonization surveillance program. It is not intended to diagnose MRSA infection nor to guide or monitor treatment for MRSA infections.   Culture, sputum-assessment     Status: None   Collection Time: 11/25/14  7:00 PM  Result Value Ref Range Status   Specimen Description SPUTUM  Final   Special Requests NONE  Final   Sputum evaluation   Final    THIS SPECIMEN IS ACCEPTABLE. RESPIRATORY CULTURE REPORT TO FOLLOW.   Report Status 11/25/2014 FINAL  Final  Culture, respiratory (NON-Expectorated)     Status: None   Collection Time: 11/25/14  7:00 PM  Result Value Ref Range Status   Specimen Description SPUTUM  Final   Special Requests NONE  Final   Gram Stain   Final    RARE WBC PRESENT, PREDOMINANTLY PMN RARE SQUAMOUS EPITHELIAL CELLS PRESENT RARE YEAST RARE GRAM POSITIVE COCCI IN PAIRS IN CLUSTERS RARE GRAM NEGATIVE RODS    Culture   Final    NORMAL OROPHARYNGEAL FLORA Performed at [facility]    Report Status 11/28/2014 FINAL   Final    CBC  Recent Labs Lab 11/25/14 1220 11/26/14 0340 11/27/14 0113 11/28/14 0339 11/29/14 0340  WBC 14.4* 15.3* 10.8* 9.9 7.9  HGB 13.6 11.7* 10.2* 9.9* 9.3*  HCT 42.1 37.6* 32.1* 32.2* 30.0*  PLT 281 256 182 191 165  MCV 82.2 80.5 79.9 81.3 79.8  MCH 26.6 25.1* 25.4* 25.0* 24.7*  MCHC 32.3 31.1 31.8 30.7 31.0  RDW 14.2 14.5 14.5 14.4 14.5    Chemistries   Recent Labs Lab 11/25/14 1220 11/26/14 0340 11/27/14 0113 11/28/14 0339 11/29/14 0340  NA 134* 134* 134* 135 138  K 3.7 3.7 3.4* 3.6 3.7  CL 100 103 107 106 108  CO2 25 24 23 21 23   GLUCOSE 112* 91 100* 102* 164*  BUN 21 19 14 10 12   CREATININE 1.37* 1.32 1.05 1.04 1.02  CALCIUM 8.9 8.3* 8.0* 8.3* 8.6   Coagulation profile  Recent Labs Lab 11/25/14 1220  INR 1.13    Cardiac Enzymes  Recent Labs Lab 11/26/14 0920  TROPONINI <0.03   ------------------------------------------------------------------------------------------------------------------ Invalid input(s): POCBNP     Time Spent in minutes   30 minutes   Barton Dubois M.D on 11/29/2014 at 9:40 PM  Between 7am to 7pm - Pager - [phone]

## 2014-11-29 NOTE — H&P (View-Only) (Signed)
Name: [name] MRN: [id number] DOB: April 08, 1933    ADMISSION DATE:  11/25/2014 CONSULTATION DATE:  2/22  REFERRING MD :  Elgergawy   CHIEF COMPLAINT: lung mass  BRIEF PATIENT DESCRIPTION:  81 year old male patient admitted on 2/20 with working diagnosis initially of community-acquired pneumonia, complicated by atrial fibrillation with rapid ventricular response. Further evaluation via CT scan demonstrated a large right medial upper lobe suprahilar lung mass. With what appeared to be postobstructive changes involving the right upper and middle lobe. PCCM was asked to see re: these CT findings.  SIGNIFICANT EVENTS   STUDIES:  CT chest 2/21: Large medial RIGHT upper lobe suprahilar mass 6.5 x 4.8 6.4 cm invading the RIGHT hilum compatible prior brain neoplasm.Probable confluent RIGHT hilar adenopathy.Pulmonary nodules bilaterally, some of which are stable, additional which are of uncertain stability due to presence of infiltrates on previous exam. Postobstructive pneumonitis involving the anterior segment RIGHT upper lobe and medial segment of RIGHT middle lobe, though lymphangitic tumor spread not excluded.   HISTORY OF PRESENT ILLNESS:   81 year old male she was admitted on 2/20 w/ CC: shortness of breath w/ associated right sided pleuritic chest discomfort, cough productive of yellow sputum, and subjective chills. On evaluation in the emergency room he was found to have atrial fibrillation with rapid ventricular response. Initial chest x-ray demonstrated right perihilar infiltrate, right lower lobe airspace disease. CT scan was recommended and obtained. This demonstrated a large right upper lobe suprahilar mass measuring 6.5 x 4.8 x 6.4 cm invading the right hilum. Also demonstrated findings consistent with postobstructive pneumonitis involving the right middle lobe, upper lobe.  He was admitted to the medical service, treated in the usual fashion with antibiotics, supplemental oxygen,  and supportive care. [facility] was asked to see in consult in regards to the newly identified lung mass.  SUBJECTIVE:  Comfortable up to chair  VITAL SIGNS: Temp:  [97.9 F (36.6 C)-98.7 F (37.1 C)] 97.9 F (36.6 C) (02/23 0837) Pulse Rate:  [83-106] 103 (02/23 0837) Resp:  [24-31] 29 (02/23 0837) BP: (80-131)/(42-79) 87/52 mmHg (02/23 0837) SpO2:  [85 %-99 %] 94 % (02/23 0837)  PHYSICAL EXAMINATION: General:  Well nourished white male, in no acute distress.  Neuro:  Awake, oriented, no focal def  HEENT:  [place], no JVD NCAT Cardiovascular:  Regular irreg  Lungs:  Decreased on right  Abdomen:  Soft, non-tender + bowel sounds  Musculoskeletal:  Intact  Skin:  Intact    Recent Labs Lab 11/26/14 0340 11/27/14 0113 11/28/14 0339  NA 134* 134* 135  K 3.7 3.4* 3.6  CL 103 107 106  CO2 24 23 21   BUN 19 14 10   CREATININE 1.32 1.05 1.04  GLUCOSE 91 100* 102*    Recent Labs Lab 11/26/14 0340 11/27/14 0113 11/28/14 0339  HGB 11.7* 10.2* 9.9*  HCT 37.6* 32.1* 32.2*  WBC 15.3* 10.8* 9.9  PLT 256 182 191   Dg Chest Port 1 View  11/26/2014   CLINICAL DATA:  Subsequent evaluation right upper lobe mass and right upper lobe consolidation  EXAM: PORTABLE CHEST - 1 VIEW  COMPARISON:  11/26/2014, 11/25/2014  FINDINGS: Stable cardiac enlargement. Stable consolidation right upper lobe with right hilar fullness. Stable extensive right middle lobe infiltrate. Left lung appears clear.  IMPRESSION: No change from prior chest radiograph with known right lung mass, right upper lobe consolidation, and right middle lobe infiltrate.   Electronically Signed   By: Skipper Cliche M.D.   On:  11/26/2014 18:23    ASSESSMENT / PLAN:  Right Upper Lobe Lung Mass RUL and RML post-obstructive PNA A Fib COPD  Discussion Discussed risks and benefits of FOB with pt. I suspect R hilar lesion is lung CA. His dilt has been weaned to off, will plan to stop his heparin at 08:00 on 2/24, NPO  after a light breakfast  Plan FOB scheduled for 230 pm on 2/24 NPO except light breakfast 2/24 Hold heparin at 08:00 2/24   Baltazar Apo, MD, PhD 11/28/2014, 11:36 AM [facility] [phone] or if no answer [phone]

## 2014-11-29 NOTE — Progress Notes (Signed)
ANTICOAGULATION CONSULT NOTE - Initial Consult  Pharmacy Consult for heparin Indication: atrial fibrillation  Allergies  Allergen Reactions  . Codeine Itching    Patient Measurements: Height: 5\' 10"  (177.8 cm) Weight: 213 lb 13.5 oz (97 kg) IBW/kg (Calculated) : 73 Heparin Dosing Weight: 92 kg  Vital Signs: Temp: 98.3 F (36.8 C) (02/24 1615) Temp Source: Oral (02/24 1615) BP: 108/61 mmHg (02/24 1615) Pulse Rate: 70 (02/24 1615)  Labs:  Recent Labs  11/27/14 0113 11/27/14 1118 11/27/14 2030 11/28/14 0339 11/29/14 0340 11/29/14 0355  HGB 10.2*  --   --  9.9* 9.3*  --   HCT 32.1*  --   --  32.2* 30.0*  --   PLT 182  --   --  191 165  --   APTT 69* 56* 67* 71*  --   --   HEPARINUNFRC 0.99* 0.51  --  0.40  --  <0.10*  CREATININE 1.05  --   --  1.04 1.02  --     Estimated Creatinine Clearance: 68.6 mL/min (by C-G formula based on Cr of 1.02).   Medical History: Past Medical History  Diagnosis Date  . Syncope   . COPD (chronic obstructive pulmonary disease)   . Chronic atrial fibrillation   . GERD (gastroesophageal reflux disease)   . Coronary artery disease   . Hypertension   . Kidney stone     Medications:  See EMR  Assessment: 55 YOM with hx of afib on IV heparin, heparin turned off this morning for bronch with biopsy. No complications after procedure, pharmacy is asked to resume heparin. Previous heparin level therapeutic on 1200 units/hr.  Goal of Therapy:  Heparin level 0.3-0.7 units/ml Monitor platelets by anticoagulation protocol: Yes   Plan:  -Restart heparin at 1200 units/hr -f/u AM heparin level -Monitor for s/sx bleeding  Maryanna Shape, PharmD, BCPS  Clinical Pharmacist  Pager: [phone]   11/29/2014 8:39 PM

## 2014-11-29 NOTE — Progress Notes (Signed)
eLink Physician-Brief Progress Note Patient Name: [name] DOB: Aug 18, 1933 MRN: [id number]   Date of Service  11/29/2014  HPI/Events of Note  Bronch with biopsy at 2 PM 2/24.  A-fib by history.  No hemoptysis.  eICU Interventions  Pharmacy to dose heparin for a-fib.        YACOUB,WESAM 11/29/2014, 8:33 PM

## 2014-11-29 NOTE — Progress Notes (Signed)
ANTICOAGULATION CONSULT NOTE - Initial Consult  Pharmacy Consult for heparin Indication: atrial fibrillation  Allergies  Allergen Reactions  . Codeine Itching    Patient Measurements: Height: 5\' 10"  (177.8 cm) Weight: 213 lb 13.5 oz (97 kg) IBW/kg (Calculated) : 73 Heparin Dosing Weight: 92 kg  Vital Signs: Temp: 98.2 F (36.8 C) (02/24 1129) Temp Source: Oral (02/24 1129) BP: 103/59 mmHg (02/24 1129) Pulse Rate: 71 (02/24 1129)  Labs:  Recent Labs  11/27/14 0113 11/27/14 1118 11/27/14 2030 11/28/14 0339 11/29/14 0340 11/29/14 0355  HGB 10.2*  --   --  9.9* 9.3*  --   HCT 32.1*  --   --  32.2* 30.0*  --   PLT 182  --   --  191 165  --   APTT 69* 56* 67* 71*  --   --   HEPARINUNFRC 0.99* 0.51  --  0.40  --  <0.10*  CREATININE 1.05  --   --  1.04 1.02  --     Estimated Creatinine Clearance: 68.6 mL/min (by C-G formula based on Cr of 1.02).   Medical History: Past Medical History  Diagnosis Date  . Syncope   . COPD (chronic obstructive pulmonary disease)   . Chronic atrial fibrillation   . GERD (gastroesophageal reflux disease)   . Coronary artery disease   . Hypertension   . Kidney stone     Medications:  See EMR  Assessment: Pt continues on heparin gtt for AFib.  Heparin was turned off this morning for bronch/biopsy.  On Xarelto pta.  Hgb 9.3, plts wnl.  Goal of Therapy:  Heparin level 0.3-0.7 units/ml Monitor platelets by anticoagulation protocol: Yes   Plan:  -Continue heparin at 1200 units/hr -Daily HL, CBC -Monitor for s/sx bleeding   Hughes Better, PharmD, BCPS Clinical Pharmacist Pager: [phone] 11/29/2014 12:53 PM

## 2014-11-29 NOTE — Progress Notes (Signed)
Flutter valve instructed as ordered. Pt demonstrated clear understanding of the device.

## 2014-11-29 NOTE — Interval H&P Note (Signed)
PCCM Interval Note  Pt presents for FOB 2/24 to evaluate R hilar mass.  No contraindications to proceeding  Baltazar Apo, MD, PhD 11/29/2014, 2:07 PM [facility] [phone] or if no answer [phone]

## 2014-11-29 NOTE — Progress Notes (Signed)
Subjective:  Breathing is better.  Plans for biopsy later on today.  Atrial fibrillation rate is much better controlled.  No chest pain.  No fever.  Objective:  Vital Signs in the last 24 hours: BP 82/43 mmHg  Pulse 74  Temp(Src) 97.4 F (36.3 C) (Oral)  Resp 18  Ht 5\' 10"  (1.778 m)  Wt 97 kg (213 lb 13.5 oz)  BMI 30.68 kg/m2  SpO2 95%  Physical Exam: Pleasant WM in NAD Lungs:  Decreased BS right lung Cardiac:  irregular rhythm, normal S1 and S2, no S3 Abdomen:  Soft, nontender, no masses Extremities:  No edema present  Intake/Output from previous day: 02/23 0701 - 02/24 0700 In: 2433.1 [P.O.:850; I.V.:1583.1] Out: 1905 [Urine:1905] Weight Filed Weights   11/26/14 0342 11/27/14 0354 11/29/14 0500  Weight: 91.536 kg (201 lb 12.8 oz) 93.441 kg (206 lb) 97 kg (213 lb 13.5 oz)    Lab Results: Basic Metabolic Panel:  Recent Labs  11/28/14 0339 11/29/14 0340  NA 135 138  K 3.6 3.7  CL 106 108  CO2 21 23  GLUCOSE 102* 164*  BUN 10 12  CREATININE 1.04 1.02    CBC:  Recent Labs  11/28/14 0339 11/29/14 0340  WBC 9.9 7.9  HGB 9.9* 9.3*  HCT 32.2* 30.0*  MCV 81.3 79.8  PLT 191 165    BNP    Component Value Date/Time   BNP 119.9* 11/25/2014 1220   Telemetry: Atrial fibrillation rate currently controlled overnight  Assessment/Plan:  1.  Chronic atrial fibrillation rate better controlled today  2.  Postobstructive pneumonia 3.  Concern for lung cancer  Recommendations:  Continue his home dose of diltiazem at this time.  Blood pressure is on the low side but I would continue to give it as long as his pressure is above 80.  May use small dose of Lanoxin to help with rate control if needed.  Suggest 0.125 mg daily at his age.    Kerry Hough  MD [facility]  11/29/2014, 8:40 AM

## 2014-11-30 ENCOUNTER — Inpatient Hospital Stay (HOSPITAL_COMMUNITY): Payer: Medicare Other | Admitting: Anesthesiology

## 2014-11-30 ENCOUNTER — Encounter (HOSPITAL_COMMUNITY): Payer: Self-pay | Admitting: Emergency Medicine

## 2014-11-30 ENCOUNTER — Encounter (HOSPITAL_COMMUNITY)
Admission: EM | Disposition: A | Discharge status: Home / Self Care | Payer: Self-pay | Source: Home / Self Care | Attending: Internal Medicine

## 2014-11-30 DIAGNOSIS — K209 Esophagitis, unspecified without bleeding: Secondary | ICD-10-CM | POA: Insufficient documentation

## 2014-11-30 DIAGNOSIS — C3490 Malignant neoplasm of unspecified part of unspecified bronchus or lung: Secondary | ICD-10-CM | POA: Insufficient documentation

## 2014-11-30 DIAGNOSIS — C3491 Malignant neoplasm of unspecified part of right bronchus or lung: Secondary | ICD-10-CM

## 2014-11-30 DIAGNOSIS — J438 Other emphysema: Secondary | ICD-10-CM

## 2014-11-30 HISTORY — PX: ESOPHAGOGASTRODUODENOSCOPY: SHX5428

## 2014-11-30 LAB — CBC
HCT: 33.5 % — ABNORMAL LOW (ref 39.0–52.0)
HEMOGLOBIN: 10.2 g/dL — AB (ref 13.0–17.0)
MCH: 25 pg — AB (ref 26.0–34.0)
MCHC: 30.4 g/dL (ref 30.0–36.0)
MCV: 82.1 fL (ref 78.0–100.0)
PLATELETS: 243 10*3/uL (ref 150–400)
RBC: 4.08 MIL/uL — ABNORMAL LOW (ref 4.22–5.81)
RDW: 14.6 % (ref 11.5–15.5)
WBC: 20.5 10*3/uL — ABNORMAL HIGH (ref 4.0–10.5)

## 2014-11-30 LAB — HEPARIN LEVEL (UNFRACTIONATED): Heparin Unfractionated: 0.3 IU/mL (ref 0.30–0.70)

## 2014-11-30 SURGERY — EGD (ESOPHAGOGASTRODUODENOSCOPY)
Anesthesia: Monitor Anesthesia Care

## 2014-11-30 MED ORDER — PROPOFOL 10 MG/ML IV BOLUS
INTRAVENOUS | Status: DC | PRN
  Administered 2014-11-30 (×2): 20 mg via INTRAVENOUS

## 2014-11-30 MED ORDER — LIDOCAINE HCL (CARDIAC) 20 MG/ML IV SOLN
INTRAVENOUS | Status: DC | PRN
  Administered 2014-11-30: 100 mg via INTRAVENOUS

## 2014-11-30 MED ORDER — LACTATED RINGERS IV SOLN
INTRAVENOUS | Status: DC | PRN
  Administered 2014-11-30: 15:00:00 via INTRAVENOUS

## 2014-11-30 NOTE — Progress Notes (Signed)
Patient called c/o of "piece of [place] meat stuck in my throat" airway not compromised SPO2 98% on 2.5l.Patient able to talk but unable to swallow.Md Dyann Kief made aware,GI will be consulted.

## 2014-11-30 NOTE — Anesthesia Preprocedure Evaluation (Addendum)
Anesthesia Evaluation  Patient identified by MRN, date of birth, ID band Patient awake  General Assessment Comment:Choking while eating  Reviewed: Patient's Chart, lab work & pertinent test results  Airway        Dental   Pulmonary COPDformer smoker,  Lung mass, recent bx  breath sounds clear to auscultation        Cardiovascular hypertension, + CAD Rate:Normal     Neuro/Psych    GI/Hepatic GERD-  ,  Endo/Other    Renal/GU Renal InsufficiencyRenal disease     Musculoskeletal   Abdominal   Peds  Hematology  (+) anemia , Just off heparin   Anesthesia Other Findings   Reproductive/Obstetrics                            Anesthesia Physical Anesthesia Plan  ASA: III and emergent  Anesthesia Plan: MAC   Post-op Pain Management:    Induction: Intravenous  Airway Management Planned: Natural Airway  Additional Equipment:   Intra-op Plan:   Post-operative Plan:   Informed Consent: I have reviewed the patients History and Physical, chart, labs and discussed the procedure including the risks, benefits and alternatives for the proposed anesthesia with the patient or authorized representative who has indicated his/her understanding and acceptance.     Plan Discussed with:   Anesthesia Plan Comments:         Anesthesia Quick Evaluation

## 2014-11-30 NOTE — Anesthesia Postprocedure Evaluation (Signed)
  Anesthesia Post-op Note  Patient: [name]  Procedure(s) Performed: Procedure(s): ESOPHAGOGASTRODUODENOSCOPY (EGD) (N/A)  Patient Location: PACU  Anesthesia Type:MAC  Level of Consciousness: awake and alert   Airway and Oxygen Therapy: Patient Spontanous Breathing  Post-op Pain: none  Post-op Assessment: Post-op Vital signs reviewed  Post-op Vital Signs: stable  Last Vitals:  Filed Vitals:   11/30/14 1441  BP: 104/57  Pulse: 75  Temp: 36.3 C  Resp: 22    Complications: No apparent anesthesia complications

## 2014-11-30 NOTE — Progress Notes (Signed)
Name: [name] MRN: [id number] DOB: Nov 07, 1932    ADMISSION DATE:  11/25/2014 CONSULTATION DATE:  2/22  REFERRING MD :  Elgergawy   CHIEF COMPLAINT: lung mass  BRIEF PATIENT DESCRIPTION:  81 year old male patient admitted on 2/20 with working diagnosis initially of community-acquired pneumonia, complicated by atrial fibrillation with rapid ventricular response. Further evaluation via CT scan demonstrated a large right medial upper lobe suprahilar lung mass. With what appeared to be postobstructive changes involving the right upper and middle lobe. PCCM was asked to see re: these CT findings.  SIGNIFICANT EVENTS   STUDIES:  CT chest 2/21: Large medial RIGHT upper lobe suprahilar mass 6.5 x 4.8 6.4 cm invading the RIGHT hilum compatible prior brain neoplasm.Probable confluent RIGHT hilar adenopathy.Pulmonary nodules bilaterally, some of which are stable, additional which are of uncertain stability due to presence of infiltrates on previous exam. Postobstructive pneumonitis involving the anterior segment RIGHT upper lobe and medial segment of RIGHT middle lobe, though lymphangitic tumor spread not excluded.  FOB 2/24 >> squamous cell Lung CA   HISTORY OF PRESENT ILLNESS:   81 year old male she was admitted on 2/20 w/ CC: shortness of breath w/ associated right sided pleuritic chest discomfort, cough productive of yellow sputum, and subjective chills. On evaluation in the emergency room he was found to have atrial fibrillation with rapid ventricular response. Initial chest x-ray demonstrated right perihilar infiltrate, right lower lobe airspace disease. CT scan was recommended and obtained. This demonstrated a large right upper lobe suprahilar mass measuring 6.5 x 4.8 x 6.4 cm invading the right hilum. Also demonstrated findings consistent with postobstructive pneumonitis involving the right middle lobe, upper lobe.  He was admitted to the medical service, treated in the usual fashion  with antibiotics, supplemental oxygen, and supportive care. [facility] was asked to see in consult in regards to the newly identified lung mass.  SUBJECTIVE:  Comfortable but tells me he is having trouble with food being stuck in esophagus  VITAL SIGNS: Temp:  [97.4 F (36.3 C)-98.3 F (36.8 C)] 97.4 F (36.3 C) (02/25 1441) Pulse Rate:  [29-87] 75 (02/25 1441) Resp:  [15-38] 22 (02/25 1441) BP: (91-120)/(45-70) 104/57 mmHg (02/25 1441) SpO2:  [86 %-100 %] 98 % (02/25 1441) FiO2 (%):  [2.5 %-4 %] 2.5 % (02/25 1138) Weight:  [93.5 kg (206 lb 2.1 oz)] 93.5 kg (206 lb 2.1 oz) (02/25 0500)  PHYSICAL EXAMINATION: General:  Well nourished white male, in no acute distress.  Neuro:  Awake, oriented, no focal def  HEENT:  [place], no JVD NCAT Cardiovascular:  Regular irreg  Lungs:  Decreased on right  Abdomen:  Soft, non-tender + bowel sounds  Musculoskeletal:  Intact  Skin:  Intact    Recent Labs Lab 11/27/14 0113 11/28/14 0339 11/29/14 0340  NA 134* 135 138  K 3.4* 3.6 3.7  CL 107 106 108  CO2 23 21 23   BUN 14 10 12   CREATININE 1.05 1.04 1.02  GLUCOSE 100* 102* 164*    Recent Labs Lab 11/28/14 0339 11/29/14 0340 11/30/14 0314  HGB 9.9* 9.3* 10.2*  HCT 32.2* 30.0* 33.5*  WBC 9.9 7.9 20.5*  PLT 191 165 243   No results found.  ASSESSMENT / PLAN:  Right Upper Lobe Lung Mass RUL and RML post-obstructive PNA A Fib COPD  Discussion His bronchoscopy showed that the RUL mass is an invasive squamous cell lung CA. The irregular area at the RML orifice was not malignant. I discussed results with  him and his family. He needs to be referred to Dr Julien Nordmann with Thoracic Oncology to discuss options for care.     Baltazar Apo, MD, PhD 11/30/2014, 2:56 PM [facility] [phone] or if no answer [phone]

## 2014-11-30 NOTE — Op Note (Signed)
[facility] [place], [id number]   ENDOSCOPY PROCEDURE REPORT  PATIENT: [name], [name]  MR#: [id number] BIRTHDATE: [birth year] , 81  yrs. old GENDER: male ENDOSCOPIST: Arta Silence, MD REFERRED BY:  [facility] PROCEDURE DATE:  12-01-14 PROCEDURE:  Esophagoscopy ASA CLASS:     Class III INDICATIONS:  dysphagia, sialorrhea, suspected esophageal food impaction. MEDICATIONS: Per Anesthesia TOPICAL ANESTHETIC:  DESCRIPTION OF PROCEDURE: After the risks benefits and alternatives of the procedure were thoroughly explained, informed consent was obtained.  The Pentax Gastroscope [id number] endoscope was introduced through the mouth and advanced to the stomach fundus. The instrument was slowly withdrawn as the mucosa was fully examined.    Findings:  Lots of swelling at the upper esophageal sphincter.  No obvious impacted food in the upper esophageal sphincter, but there was lots of swelling there so small food in this area could have been missed.  Schatzki's ring, estimated luminal diameter about 13 mm, otherwise normal esophagus; no retained food was seen in the esophagus.  Proximal stomach was full of food, didn't proceed further downstream. The scope was then withdrawn from the patient and the procedure completed.  COMPLICATIONS: There were no immediate complications.  ENDOSCOPIC IMPRESSION:     As above.  Extensive upper esophageal sphincter swelling, possibly result of patient's bronchoscopy yesterday.  No food in esophagus.  Extensive food in stomach.   RECOMMENDATIONS:     1.  Watch for potential complications of procedure. 2.  Clear liquid diet only tonight. 3.  OK to resume heparin from GI perspective. 4.  Will follow.  eSigned:  Arta Silence, MD 12-01-14 3:10 PM   CC:  CPT CODES: ICD CODES:  The ICD and CPT codes recommended by this software are interpretations from the data that the clinical staff has  captured with the software.  The verification of the translation of this report to the ICD and CPT codes and modifiers is the sole responsibility of the health care institution and practicing physician where this report was generated.  [employer]. will not be held responsible for the validity of the ICD and CPT codes included on this report.  AMA assumes no liability for data contained or not contained herein. CPT is a Designer, television/film set of the [employer].

## 2014-11-30 NOTE — Progress Notes (Signed)
Subjective:  Tolerated biopsy well yesterday.  No complaints of shortness of breath or chest pain today.  Awaiting pathology  Objective:  Vital Signs in the last 24 hours: BP 107/67 mmHg  Pulse 66  Temp(Src) 97.4 F (36.3 C) (Oral)  Resp 22  Ht 5\' 10"  (1.778 m)  Wt 93.5 kg (206 lb 2.1 oz)  BMI 29.58 kg/m2  SpO2 99%  Physical Exam: Pleasant WM in NAD Lungs:  Decreased BS right lung  Cardiac:  irregular rhythm, normal S1 and S2, no S3 Extremities:  No edema present  Intake/Output from previous day: 02/24 0701 - 02/25 0700 In: 1377.9 [P.O.:400; I.V.:977.9] Out: 1200 [Urine:1200] Weight Filed Weights   11/27/14 0354 11/29/14 0500 11/30/14 0500  Weight: 93.441 kg (206 lb) 97 kg (213 lb 13.5 oz) 93.5 kg (206 lb 2.1 oz)    Lab Results: Basic Metabolic Panel:  Recent Labs  11/28/14 0339 11/29/14 0340  NA 135 138  K 3.6 3.7  CL 106 108  CO2 21 23  GLUCOSE 102* 164*  BUN 10 12  CREATININE 1.04 1.02    CBC:  Recent Labs  11/29/14 0340 11/30/14 0314  WBC 7.9 20.5*  HGB 9.3* 10.2*  HCT 30.0* 33.5*  MCV 79.8 82.1  PLT 165 243    BNP    Component Value Date/Time   BNP 119.9* 11/25/2014 1220   Telemetry: Atrial fibrillation rate currently controlled   Assessment/Plan:  1.  Chronic atrial fibrillation rate controlled 2.  Postobstructive pneumonia 3.  Concern for lung cancer  Recommendations:  He is clinically better.  I would restart his XARELTO when he is over his bleeding risk from his biopsy.   Kerry Hough  MD [facility]  11/30/2014, 9:00 AM

## 2014-11-30 NOTE — Transfer of Care (Signed)
Immediate Anesthesia Transfer of Care Note  Patient: [name]  Procedure(s) Performed: Procedure(s): ESOPHAGOGASTRODUODENOSCOPY (EGD) (N/A)  Patient Location: Endoscopy Unit  Anesthesia Type:MAC  Level of Consciousness: awake, alert , oriented and patient cooperative  Airway & Oxygen Therapy: Patient Spontanous Breathing and Patient connected to nasal cannula oxygen  Post-op Assessment: Report given to RN, Post -op Vital signs reviewed and stable and Patient moving all extremities  Post vital signs: Reviewed and stable  Last Vitals:  Filed Vitals:   11/30/14 1441  BP: 104/57  Pulse: 75  Temp: 36.3 C  Resp: 22    Complications: No apparent anesthesia complications

## 2014-11-30 NOTE — Progress Notes (Signed)
ANTICOAGULATION CONSULT NOTE - Initial Consult  Pharmacy Consult for heparin Indication: atrial fibrillation  Allergies  Allergen Reactions  . Codeine Itching    Patient Measurements: Height: 5\' 10"  (177.8 cm) Weight: 206 lb 2.1 oz (93.5 kg) IBW/kg (Calculated) : 73 Heparin Dosing Weight: 92 kg  Vital Signs: Temp: 97.4 F (36.3 C) (02/25 0816) Temp Source: Oral (02/25 0816) BP: 107/67 mmHg (02/25 0816) Pulse Rate: 66 (02/25 0816)  Labs:  Recent Labs  11/27/14 1118 11/27/14 2030  11/28/14 0339 11/29/14 0340 11/29/14 0355 11/30/14 0314  HGB  --   --   < > 9.9* 9.3*  --  10.2*  HCT  --   --   --  32.2* 30.0*  --  33.5*  PLT  --   --   --  191 165  --  243  APTT 56* 67*  --  71*  --   --   --   HEPARINUNFRC 0.51  --   --  0.40  --  <0.10* 0.30  CREATININE  --   --   --  1.04 1.02  --   --   < > = values in this interval not displayed.  Estimated Creatinine Clearance: 67.4 mL/min (by C-G formula based on Cr of 1.02).   Medical History: Past Medical History  Diagnosis Date  . Syncope   . COPD (chronic obstructive pulmonary disease)   . Chronic atrial fibrillation   . GERD (gastroesophageal reflux disease)   . Coronary artery disease   . Hypertension   . Kidney stone     Medications:  See EMR  Assessment: Pt continues on heparin gtt for AFib, heparin was off yesterday for bronchoscopy and biopsy.  Pt is on Xarelto PTA. Hgb 10.2, plts wnl. HL is on low end this morning, but pt was previously stable at this heparin rate, will continue for now as I also anticipate pt will be able to resume Xarelto soon.  Goal of Therapy:  Heparin level 0.3-0.7 units/ml Monitor platelets by anticoagulation protocol: Yes   Plan:  -Continue heparin at 1200 units/hr -Daily HL, CBC -F/u transition to PO anticoagulation    Hughes Better, PharmD, BCPS Clinical Pharmacist Pager: [phone] 11/30/2014 10:08 AM

## 2014-11-30 NOTE — Consult Note (Signed)
[facility] Gastroenterology Consultation Note  Referring Provider:  Dr. Barton Dubois [facility]) Primary Care Physician:  [name], MD  Reason for Consultation:  Suspect esophageal food impaction  HPI: [name] is a 81 y.o. male admitted for progressive shortness of breath.  Was ultimately found to have right upper lobe mass on CT and underwent bronchoscopy with biopsy yesterday.  Has chronic atrial fibrillation and is on heparin drip.  About one hour ago, while eating some [place], patient had sensation of food being stuck in his chest.  He's had this happen several times before.  I did endoscopy for esophageal food impaction with subsequent elective endoscopy for dilatation of Schatzki's ring (balloon to max 16.32mm) about 4 years ago.  Patient tells me that his dysphagia didn't improve much after his dilatation, yet he has not had any further endoscopies or dilatations since that time.  At present, he is unable to tolerate liquids and is spitting up some of his own saliva.   Past Medical History  Diagnosis Date  . Syncope   . COPD (chronic obstructive pulmonary disease)   . Chronic atrial fibrillation   . GERD (gastroesophageal reflux disease)   . Coronary artery disease   . Hypertension   . Kidney stone     Past Surgical History  Procedure Laterality Date  . Appendectomy    . Cholecystectomy    . Esophageal dilation    . Video bronchoscopy Bilateral 11/29/2014    Procedure: VIDEO BRONCHOSCOPY WITHOUT FLUORO;  Surgeon: Collene Gobble, MD;  Location: [place];  Service: Cardiopulmonary;  Laterality: Bilateral;    Prior to Admission medications   Medication Sig Start Date End Date Taking? Authorizing Provider  albuterol (PROVENTIL HFA;VENTOLIN HFA) 108 (90 BASE) MCG/ACT inhaler Inhale 1-2 puffs into the lungs every 6 (six) hours as needed for wheezing or shortness of breath.   Yes Historical Provider, MD  budesonide-formoterol (SYMBICORT) 160-4.5 MCG/ACT inhaler Inhale 2 puffs  into the lungs 2 (two) times daily.   Yes Historical Provider, MD  diltiazem (CARDIZEM) 90 MG tablet Take 90 mg by mouth 2 (two) times daily. Take two tablets in the morning and take one tablet in the afternoon.   Yes Historical Provider, MD  diphenhydramine-acetaminophen (TYLENOL PM) 25-500 MG TABS Take 2 tablets by mouth at bedtime as needed.   Yes Historical Provider, MD  famotidine (PEPCID) 20 MG tablet Take 20 mg by mouth daily as needed for heartburn or indigestion.   Yes Historical Provider, MD  furosemide (LASIX) 40 MG tablet Take 40 mg by mouth daily.   Yes Historical Provider, MD  rivaroxaban (XARELTO) 20 MG TABS tablet Take 20 mg by mouth daily with supper.   Yes Historical Provider, MD  zolpidem (AMBIEN) 10 MG tablet Take 5 mg by mouth at bedtime as needed for sleep.   Yes Historical Provider, MD    Current Facility-Administered Medications  Medication Dose Route Frequency Provider Last Rate Last Dose  . 0.9 %  sodium chloride infusion   Intravenous Continuous Phillips Climes, MD 50 mL/hr at 11/29/14 2002 50 mL/hr at 11/29/14 2002  . 0.9 %  sodium chloride infusion   Intravenous Continuous Collene Gobble, MD      . acetaminophen (TYLENOL) tablet 650 mg  650 mg Oral Q6H PRN Samella Parr, NP       Or  . acetaminophen (TYLENOL) suppository 650 mg  650 mg Rectal Q6H PRN Samella Parr, NP      . albuterol (PROVENTIL) (2.5  MG/3ML) 0.083% nebulizer solution 2.5 mg  2.5 mg Nebulization Q6H PRN Verlee Monte, MD   2.5 mg at 11/28/14 0053  . albuterol (PROVENTIL) (2.5 MG/3ML) 0.083% nebulizer solution 2.5 mg  2.5 mg Nebulization TID Phillips Climes, MD   2.5 mg at 11/30/14 0957  . alum & mag hydroxide-simeth (MAALOX/MYLANTA) 200-200-20 MG/5ML suspension 30 mL  30 mL Oral Q6H PRN Samella Parr, NP   30 mL at 11/30/14 1610  . budesonide-formoterol (SYMBICORT) 160-4.5 MCG/ACT inhaler 2 puff  2 puff Inhalation BID Samella Parr, NP   2 puff at 11/30/14 0957  . diltiazem (CARDIZEM) tablet  90 mg  90 mg Oral 3 times per day Jacolyn Reedy, MD   90 mg at 11/30/14 0543  . docusate sodium (COLACE) capsule 100 mg  100 mg Oral BID Samella Parr, NP   100 mg at 11/30/14 9604  . famotidine (PEPCID) tablet 20 mg  20 mg Oral Daily PRN Samella Parr, NP      . guaiFENesin [facility]) 12 hr tablet 1,200 mg  1,200 mg Oral BID Phillips Climes, MD   1,200 mg at 11/30/14 0926  . heparin ADULT infusion 100 units/mL (25000 units/250 mL)  1,200 Units/hr Intravenous Continuous Manley Mason, RPH 12 mL/hr at 11/30/14 0302 1,200 Units/hr at 11/30/14 0302  . levofloxacin (LEVAQUIN) tablet 750 mg  750 mg Oral Daily Phillips Climes, MD   750 mg at 11/30/14 0926  . lidocaine (XYLOCAINE) 2 % jelly 1 application  1 application Topical Once Collene Gobble, MD      . methylPREDNISolone sodium succinate (SOLU-MEDROL) 125 mg/2 mL injection 60 mg  60 mg Intravenous Q12H Phillips Climes, MD   60 mg at 11/30/14 0924  . morphine 2 MG/ML injection 1-2 mg  1-2 mg Intravenous Q4H PRN Samella Parr, NP   2 mg at 11/25/14 5409  . ondansetron (ZOFRAN) tablet 4 mg  4 mg Oral Q6H PRN Samella Parr, NP       Or  . ondansetron [facility]) injection 4 mg  4 mg Intravenous Q6H PRN Samella Parr, NP      . oxyCODONE (Oxy IR/ROXICODONE) immediate release tablet 5 mg  5 mg Oral Q4H PRN Samella Parr, NP      . pantoprazole (PROTONIX) EC tablet 40 mg  40 mg Oral Daily Phillips Climes, MD   40 mg at 11/30/14 8119  . phenylephrine (NEO-SYNEPHRINE) 0.25 % nasal spray 1 spray  1 spray Each Nare Q6H PRN Collene Gobble, MD      . pneumococcal 23 valent vaccine (PNU-IMMUNE) injection 0.5 mL  0.5 mL Intramuscular Tomorrow-1000 Mc[facility], MD   0.5 mL at 11/26/14 0935  . polyvinyl alcohol (LIQUIFILM TEARS) 1.4 % ophthalmic solution 1 drop  1 drop Both Eyes PRN Verlee Monte, MD   1 drop at 11/30/14 1007  . sodium chloride 0.9 % injection 3 mL  3 mL Intravenous Q12H Samella Parr, NP   3 mL at 11/30/14 0926  . zolpidem  (AMBIEN) tablet 5 mg  5 mg Oral QHS PRN Samella Parr, NP   5 mg at 11/29/14 2236    Allergies as of 11/25/2014 - Review Complete 11/25/2014  Allergen Reaction Noted  . Codeine Itching 03/19/2012    Family History  Problem Relation Age of Onset  . Stroke    . Cancer    . Heart attack Sister     History   Social History  .  Marital Status: Married    Spouse Name: N/A  . Number of Children: N/A  . Years of Education: N/A   Occupational History  . Not on file.   Social History Main Topics  . Smoking status: Former Smoker -- 0.00 packs/day for 65 years  . Smokeless tobacco: Not on file  . Alcohol Use: Yes     Comment: 2-3 every night  . Drug Use: Not on file  . Sexual Activity: Not on file   Other Topics Concern  . Not on file   Social History Narrative    Review of Systems: ROS Dr. Hartford Poli 11/25/14 reviewed and I agree  Physical Exam: Vital signs in last 24 hours: Temp:  [97.4 F (36.3 C)-98.3 F (36.8 C)] 97.6 F (36.4 C) (02/25 1138) Pulse Rate:  [29-87] 71 (02/25 1138) Resp:  [15-38] 23 (02/25 1138) BP: (91-120)/(45-70) 110/70 mmHg (02/25 1138) SpO2:  [86 %-100 %] 98 % (02/25 1138) FiO2 (%):  [2.5 %-4 %] 2.5 % (02/25 1138) Weight:  [93.5 kg (206 lb 2.1 oz)] 93.5 kg (206 lb 2.1 oz) (02/25 0500) Last BM Date: 11/27/14 General:  Chronically ill-appearing, slightly tachypneic at rest Head:  Normocephalic and atraumatic. Eyes:  Sclera clear, no icterus.   Conjunctiva pink. Ears:  Normal auditory acuity. Nose:  No deformity, discharge,  or lesions. Mouth:  No deformity or lesions.  Oropharynx pink & moist. Neck:  Supple; no masses or thyromegaly. Lungs:  Diminished aeration diffusely L > R.    Heart:  Regular rate and rhythm; no murmurs, clicks, rubs,  or gallops. Abdomen:  Soft, nontender and nondistended. No masses, hepatosplenomegaly or hernias noted. Normal bowel sounds, without guarding, and without rebound.     Msk:  Symmetrical without gross  deformities. Normal posture. Pulses:  Normal pulses noted. Extremities:  Without clubbing or edema. Neurologic:  Alert and  oriented x4; diffusely weak, otherwise grossly normal neurologically. Skin:  Diffuse ecchymoses. Psych:  Alert and cooperative. Normal mood and affect.   Lab Results:  Recent Labs  11/28/14 0339 11/29/14 0340 11/30/14 0314  WBC 9.9 7.9 20.5*  HGB 9.9* 9.3* 10.2*  HCT 32.2* 30.0* 33.5*  PLT 191 165 243   BMET  Recent Labs  11/28/14 0339 11/29/14 0340  NA 135 138  K 3.6 3.7  CL 106 108  CO2 21 23  GLUCOSE 102* 164*  BUN 10 12  CREATININE 1.04 1.02  CALCIUM 8.3* 8.6   LFT No results for input(s): PROT, ALBUMIN, AST, ALT, ALKPHOS, BILITOT, BILIDIR, IBILI in the last 72 hours. PT/INR No results for input(s): LABPROT, INR in the last 72 hours.  Studies/Results: No results found.  Impression:  1.  Longstanding intermittent dysphagia. 2.  Suspect esophageal food impaction. 3.  Shortness of breath with lung mass with bronchoscopy-guided biopsy yesterday.  Plan:  1.  Holding heparin. 2.  Endoscopy today for anticipated foreign body removal, after at least one hour off heparin. 3.  Anesthesia will provide sedation, and will likely intubate patient for the procedure.   LOS: 5 days   Donalda Job M  11/30/2014, 2:25 PM

## 2014-11-30 NOTE — Progress Notes (Signed)
Patient Demographics  [name], is a 81 y.o. male, DOB - 11-Aug-1933, BZJ:[id number]  Admit date - 11/25/2014   Admitting Physician Verlee Monte, MD  Outpatient Primary MD for the patient is [name], MD  LOS - 5   Chief Complaint  Patient presents with  . Shortness of Breath      Admission history of present illness/brief narrative;  [name] is a 81 y.o. male, past medical history of chronic atrial fibrillation on xarelto (followed by a Dr. Wynonia Lawman), COPD, CAD, hypertension and reflux. He presented to the hospital with shortness of breath and right-sided chest pain pleuritic in nature.  He had productive cough of yellow phlegm. Was found to have A. fib with RVR at PCP office, so he was sent to ED . Patient had leukocytosis, A. fib with RVR heart rate in the 130s, patient was started on Cardizem drip, chest x-ray showing right-sided pneumonia, with right hilum enlargement , as well patient report pleuritic right-sided chest pain with cough, troponins were negative, no EKG changes, patient was admitted for community-acquired pneumonia treatment.started on IV levofloxacine, CT chest with IV contrast revealed right lung mass suspicious for malignancy, patient has been seen by pulmonary and bronchoscopy done 2/24 with biopsy (positive for invasive lung cancer).  Subjective:   [name] no fever. Reports breathing is better. Complaining of sensation food stuck in his throat and inability to swallow.  Assessment & Plan    Principal Problem:   Pneumonia, organism unspecified Active Problems:   Atrial fibrillation with RVR   CAP (community acquired pneumonia)   Acute renal failure   History of tobacco abuse   COPD (chronic obstructive pulmonary disease)   Abnormal CT of the chest    Right lung mass (positive for invasive squamous cell lung  cancer) - Most likely related to malignancy - pulmonology consult appreciated, bronchoscopy with biopsy done 2/24 and positive for invasive squamous cell lung cancer - Continue to hold Xarelto, but continue with heparin gtt. Meanwhile. -will need follow up with Dr. Julien Nordmann (thoracic oncology)  A. fib with RVR - Most likely related to sepsis from community-acquired pneumonia. - Management as per cardiology, discussed with Dr. Wynonia Lawman -rate now controlled -continue cardizem and digoxin -BP soft, but stable and patient asymptomatic  Acute on chronic hypoxic respiratory failure/ COPD exacerbation -improve air movement, less wheezing -will continue steroids, Continue nebs, continue antibiotics -will also continue mucinex and continue flutter valve  Chest pain - Appears to be pleuritic in nature secondary to cough. -EKG showing no significant ST abnormalities -cardiology on board; will follow rec's  Community acquired pneumonia/postobstructive pneumonia/early sepsis -Continue with levofloxacin -Follow On sputum culture -no fever, breathing better and with normal WBC's  Dehydration -Continue to hold Lasix - continue gentle hydration overnight -patient now with decrease PO intake capacity  GERD/esophagitis and sensation of stuck food -had hx of esophageal stricture and dilatation in the past -unable to swallow and with sensation of food stuck in his throat -GI consulted and EGD for disimpaction was performed; no food appreciated and just extensive swelling and irritation. Most likely from bronchoscopy on 2/24 -ok to advance to liquid diet -continue PPI  Code Status: Full  Family Communication: no family at bedside  Disposition Plan: Remains  on step down; hopefully home in am   Procedures  FOB on 11/29/14:pending  2-D echo 11/26/14: - Left ventricle: The cavity size was normal. Wall thickness was increased in a pattern of moderate LVH. Systolic function was normal. The  estimated ejection fraction was in the range of 50% to 55%. - Aortic valve: Valve area (Vmax): 3.12 cm^2. - Left atrium: The atrium was mildly dilated. - Right atrium: The atrium was mildly dilated. - Atrial septum: No defect or patent foramen ovale was identified.  EGD: 2/25 -Extensive upper esophageal sphincter swelling, possibly result of patient's bronchoscopy yesterday.  -No food in esophagus.  -schatzki ring appreciated -Extensive food in stomach.  Consults   Cardiology Pulmonary GI  Medications  Scheduled Meds: . albuterol  2.5 mg Nebulization TID  . budesonide-formoterol  2 puff Inhalation BID  . diltiazem  90 mg Oral 3 times per day  . docusate sodium  100 mg Oral BID  . guaiFENesin  1,200 mg Oral BID  . levofloxacin  750 mg Oral Daily  . lidocaine  1 application Topical Once  . methylPREDNISolone (SOLU-MEDROL) injection  60 mg Intravenous Q12H  . pantoprazole  40 mg Oral Daily  . pneumococcal 23 valent vaccine  0.5 mL Intramuscular Tomorrow-1000  . sodium chloride  3 mL Intravenous Q12H   Continuous Infusions: . sodium chloride 50 mL/hr (11/29/14 2002)  . sodium chloride    . heparin 1,200 Units/hr (11/30/14 0302)   PRN Meds:.acetaminophen **OR** acetaminophen, albuterol, alum & mag hydroxide-simeth, famotidine, morphine injection, ondansetron **OR** ondansetron (ZOFRAN) IV, oxyCODONE, phenylephrine, polyvinyl alcohol, zolpidem  DVT Prophylaxis  on heparin drip  Lab Results  Component Value Date   PLT 243 11/30/2014    Antibiotics    Anti-infectives    Start     Dose/Rate Route Frequency Ordered Stop   11/28/14 1000  levofloxacin (LEVAQUIN) tablet 750 mg     750 mg Oral Daily 11/28/14 0904 12/04/14 0959   11/27/14 1500  levofloxacin (LEVAQUIN) IVPB 750 mg  Status:  Discontinued     750 mg 100 mL/hr over 90 Minutes Intravenous Every 48 hours 11/25/14 1621 11/27/14 1407   11/27/14 1500  levofloxacin (LEVAQUIN) IVPB 750 mg  Status:  Discontinued       750 mg 100 mL/hr over 90 Minutes Intravenous Daily 11/27/14 1407 11/27/14 1409   11/27/14 1500  levofloxacin (LEVAQUIN) IVPB 750 mg  Status:  Discontinued     750 mg 100 mL/hr over 90 Minutes Intravenous Daily 11/27/14 1409 11/28/14 0904   11/25/14 1630  levofloxacin (LEVAQUIN) IVPB 750 mg  Status:  Discontinued     750 mg 100 mL/hr over 90 Minutes Intravenous Every 24 hours 11/25/14 1618 11/25/14 1620   11/25/14 1415  levofloxacin (LEVAQUIN) IVPB 750 mg     750 mg 100 mL/hr over 90 Minutes Intravenous  Once 11/25/14 1403 11/25/14 1645        Objective:   Filed Vitals:   11/30/14 0816 11/30/14 0957 11/30/14 1138 11/30/14 1441  BP: 107/67  110/70 104/57  Pulse: 66  71 75  Temp: 97.4 F (36.3 C)  97.6 F (36.4 C) 97.4 F (36.3 C)  TempSrc: Oral  Oral Oral  Resp: 22  23 22   Height:      Weight:      SpO2: 99% 97% 98% 98%    Wt Readings from Last 3 Encounters:  11/30/14 93.5 kg (206 lb 2.1 oz)     Intake/Output Summary (Last 24 hours) at 11/30/14 1530  Last data filed at 11/30/14 1511  Gross per 24 hour  Intake 1175.87 ml  Output    950 ml  Net 225.87 ml     Physical Exam Gen: Awake Alert, Oriented X 3, No focal motor or sensory deficits; feeling better and breathing easier. Lungs: no crackles; positive for mild exp wheezing and diminish BS at bases  Heart: positive SEM, no rubs or gallops; no edema; rate controlled Abd: soft, NT, ND, positive BS Extremities:No Cyanosis, Clubbing or edema, No new Rash or bruise     Data Review   Micro Results Recent Results (from the past 240 hour(s))  MRSA PCR Screening     Status: None   Collection Time: 11/25/14  4:12 PM  Result Value Ref Range Status   MRSA by PCR NEGATIVE NEGATIVE Final    Comment:        The GeneXpert MRSA Assay (FDA approved for NASAL specimens only), is one component of a comprehensive MRSA colonization surveillance program. It is not intended to diagnose MRSA infection nor to guide  or monitor treatment for MRSA infections.   Culture, sputum-assessment     Status: None   Collection Time: 11/25/14  7:00 PM  Result Value Ref Range Status   Specimen Description SPUTUM  Final   Special Requests NONE  Final   Sputum evaluation   Final    THIS SPECIMEN IS ACCEPTABLE. RESPIRATORY CULTURE REPORT TO FOLLOW.   Report Status 11/25/2014 FINAL  Final  Culture, respiratory (NON-Expectorated)     Status: None   Collection Time: 11/25/14  7:00 PM  Result Value Ref Range Status   Specimen Description SPUTUM  Final   Special Requests NONE  Final   Gram Stain   Final    RARE WBC PRESENT, PREDOMINANTLY PMN RARE SQUAMOUS EPITHELIAL CELLS PRESENT RARE YEAST RARE GRAM POSITIVE COCCI IN PAIRS IN CLUSTERS RARE GRAM NEGATIVE RODS    Culture   Final    NORMAL OROPHARYNGEAL FLORA Performed at [facility]    Report Status 11/28/2014 FINAL  Final    CBC  Recent Labs Lab 11/26/14 0340 11/27/14 0113 11/28/14 0339 11/29/14 0340 11/30/14 0314  WBC 15.3* 10.8* 9.9 7.9 20.5*  HGB 11.7* 10.2* 9.9* 9.3* 10.2*  HCT 37.6* 32.1* 32.2* 30.0* 33.5*  PLT 256 182 191 165 243  MCV 80.5 79.9 81.3 79.8 82.1  MCH 25.1* 25.4* 25.0* 24.7* 25.0*  MCHC 31.1 31.8 30.7 31.0 30.4  RDW 14.5 14.5 14.4 14.5 14.6    Chemistries   Recent Labs Lab 11/25/14 1220 11/26/14 0340 11/27/14 0113 11/28/14 0339 11/29/14 0340  NA 134* 134* 134* 135 138  K 3.7 3.7 3.4* 3.6 3.7  CL 100 103 107 106 108  CO2 25 24 23 21 23   GLUCOSE 112* 91 100* 102* 164*  BUN 21 19 14 10 12   CREATININE 1.37* 1.32 1.05 1.04 1.02  CALCIUM 8.9 8.3* 8.0* 8.3* 8.6   Coagulation profile  Recent Labs Lab 11/25/14 1220  INR 1.13    Cardiac Enzymes  Recent Labs Lab 11/26/14 0920  TROPONINI <0.03   ------------------------------------------------------------------------------------------------------------------ Invalid input(s): POCBNP     Time Spent in minutes   25 minutes   Barton Dubois M.D  on 11/30/2014 at 3:30 PM  Between 7am to 7pm - Pager - [phone]

## 2014-12-01 ENCOUNTER — Encounter (HOSPITAL_COMMUNITY): Payer: Self-pay | Admitting: Gastroenterology

## 2014-12-01 DIAGNOSIS — I4891 Unspecified atrial fibrillation: Secondary | ICD-10-CM | POA: Insufficient documentation

## 2014-12-01 LAB — CBC
HCT: 32.5 % — ABNORMAL LOW (ref 39.0–52.0)
Hemoglobin: 9.8 g/dL — ABNORMAL LOW (ref 13.0–17.0)
MCH: 24.7 pg — AB (ref 26.0–34.0)
MCHC: 30.2 g/dL (ref 30.0–36.0)
MCV: 81.9 fL (ref 78.0–100.0)
PLATELETS: 208 10*3/uL (ref 150–400)
RBC: 3.97 MIL/uL — ABNORMAL LOW (ref 4.22–5.81)
RDW: 14.7 % (ref 11.5–15.5)
WBC: 13.7 10*3/uL — ABNORMAL HIGH (ref 4.0–10.5)

## 2014-12-01 LAB — HEPARIN LEVEL (UNFRACTIONATED): Heparin Unfractionated: 0.2 IU/mL — ABNORMAL LOW (ref 0.30–0.70)

## 2014-12-01 MED ORDER — PANTOPRAZOLE SODIUM 40 MG PO TBEC: 40.0000 mg | DELAYED_RELEASE_TABLET | Freq: Two times a day (BID) | ORAL | Status: DC

## 2014-12-01 MED ORDER — FUROSEMIDE 40 MG PO TABS: 20.0000 mg | ORAL_TABLET | Freq: Every day | ORAL | Status: DC

## 2014-12-01 MED ORDER — PANTOPRAZOLE SODIUM 40 MG PO TBEC
40.0000 mg | DELAYED_RELEASE_TABLET | Freq: Two times a day (BID) | ORAL | Status: DC
  Administered 2014-12-01: 40 mg via ORAL
  Filled 2014-12-01: qty 1

## 2014-12-01 MED ORDER — RIVAROXABAN 20 MG PO TABS
20.0000 mg | ORAL_TABLET | Freq: Every day | ORAL | Status: DC
  Administered 2014-12-01: 20 mg via ORAL
  Filled 2014-12-01: qty 1

## 2014-12-01 MED ORDER — SUCRALFATE 1 GM/10ML PO SUSP: 1.0000 g | Freq: Three times a day (TID) | ORAL | Status: DC

## 2014-12-01 MED ORDER — LEVOFLOXACIN 750 MG PO TABS: 750.0000 mg | ORAL_TABLET | Freq: Every day | ORAL | Status: DC

## 2014-12-01 MED ORDER — SUCRALFATE 1 GM/10ML PO SUSP
1.0000 g | Freq: Three times a day (TID) | ORAL | Status: DC
  Administered 2014-12-01: 1 g via ORAL
  Filled 2014-12-01 (×4): qty 10

## 2014-12-01 MED ORDER — PREDNISONE 20 MG PO TABS: ORAL_TABLET | ORAL | Status: DC

## 2014-12-01 NOTE — Care Management Note (Signed)
    Page 1 of 1   12/01/2014     2:15:23 PM CARE MANAGEMENT NOTE 12/01/2014  Patient:  [name], [name]   Account Number:  [id number]  Date Initiated:  11/28/2014  Documentation initiated by:  Zakhai Meisinger  Subjective/Objective Assessment:   dx AFib w/RVR; lives alone    PCP  Juanita Craver     Action/Plan:   Anticipated DC Date:  12/01/2014   Anticipated DC Plan:  [place]  CM consult      Choice offered to / List presented to:     DME arranged  [facility]      DME agency  [place].        Status of service:  Completed, signed off Medicare Important Message given?  YES (If response is "NO", the following Medicare IM given date fields will be blank) Date Medicare IM given:  11/28/2014 Medicare IM given by:  Vici Novick Date Additional Medicare IM given:  12/01/2014 Additional Medicare IM given by:  [facility] Issiah Huffaker  Discharge Disposition:  HOME/SELF CARE  Per UR Regulation:  Reviewed for med. necessity/level of care/duration of stay  If discussed at [place] of Stay Meetings, dates discussed:    Comments:  11/28/14 [place] RN MSN BSN CCM Per dtr, she and other family members check on pt frequently and pt has been managing well.  Pt states he fills a med box and takes med as prescribed.  Noted concern about whether pt was taking diltiazem as prescribed - pt reports he gets his meds from [facility] and had just received 90-day supply and that was reason it appeared he had not been taking it.  Dtr stated she and other family would be aware if pt was not taking meds.

## 2014-12-01 NOTE — Progress Notes (Signed)
SATURATION QUALIFICATIONS: (This note is used to comply with regulatory documentation for home oxygen)  Patient Saturations on Room Air at Rest =97%  Patient Saturations on Room Air while Ambulating =88%  Patient Saturations on 2 Liters of oxygen while Ambulating = 88%  Please briefly explain why patient needs home oxygen:Patient gets SOB

## 2014-12-01 NOTE — Progress Notes (Signed)
[place] for Heparin to Xarelto Indication: atrial fibrillation  Allergies  Allergen Reactions  . Codeine Itching    Patient Measurements: Height: 5\' 10"  (177.8 cm) Weight: 206 lb 2.1 oz (93.5 kg) IBW/kg (Calculated) : 73 Heparin Dosing Weight: 92 kg  Vital Signs: Temp: 97.4 F (36.3 C) (02/26 0721) Temp Source: Oral (02/26 0721) BP: 98/52 mmHg (02/26 0721) Pulse Rate: 75 (02/26 0721)  Labs:  Recent Labs  11/29/14 0340 11/29/14 0355 11/30/14 0314 12/01/14 0308  HGB 9.3*  --  10.2* 9.8*  HCT 30.0*  --  33.5* 32.5*  PLT 165  --  243 208  HEPARINUNFRC  --  <0.10* 0.30 0.20*  CREATININE 1.02  --   --   --     Estimated Creatinine Clearance: 67.4 mL/min (by C-G formula based on Cr of 1.02).   Medical History: Past Medical History  Diagnosis Date  . Syncope   . COPD (chronic obstructive pulmonary disease)   . Chronic atrial fibrillation   . GERD (gastroesophageal reflux disease)   . Coronary artery disease   . Hypertension   . Kidney stone     Medications:  See EMR  Assessment: Pt continues on heparin gtt for AFib, heparin was off yesterday for bronchoscopy and biopsy.   Now resuming Xarelto for Afib, CrCl > 30 ml/min  Goal of Therapy:  Appropriate dosing Monitor platelets by anticoagulation protocol: Yes   Plan:  DC heparin and labs Xarelto 20 mg po daily  Thank you. Anette Guarneri, PharmD [phone] 12/01/2014 9:10 AM

## 2014-12-01 NOTE — Discharge Summary (Signed)
Physician Discharge Summary  GAILEN VENNE WUJ:[id number] DOB: 02/26/1933 DOA: 11/25/2014  PCP: [name], MD  Admit date: 11/25/2014 Discharge date: 12/01/2014  Time spent: >30 minutes  Recommendations for Outpatient Follow-up:  1. Check BMET to follow electrolytes and renal function 2. Check CBC to follow Hgb trend (as patient is on xarelto) 3. Reassess BP/volume status and adjust medications and diuretics as needed 4. Needs follow up with Dr. Julien Nordmann (thoracic Oncology service)  Discharge Diagnoses:  Principal Problem:   Community Acquire Pneumonia vs post obstructive PNA, organism unspecified   Atrial fibrillation with RVR   Acute renal failure   History of tobacco abuse   COPD (chronic obstructive pulmonary disease)   Abnormal CT of the chest   Acute esophagitis   SCC (squamous cell carcinoma of lung)   Discharge Condition: stable and improved. Discharge home with instructions to follow at [facility] for follow up with Dr. Julien Nordmann. Will finish antibiotics and take medications as prescribed  Diet recommendation: heart healthy diet  Filed Weights   11/27/14 0354 11/29/14 0500 11/30/14 0500  Weight: 93.441 kg (206 lb) 97 kg (213 lb 13.5 oz) 93.5 kg (206 lb 2.1 oz)    History of present illness:  81 y.o. male, past medical history of chronic atrial fibrillation on xarelto (followed by Dr. Wynonia Lawman), COPD, CAD, hypertension and reflux. He presented to the hospital with shortness of breath and right-sided chest pain pleuritic in nature. He had productive cough of yellow phlegm. Was found to have A. fib with RVR at PCP office, so he was sent to ED . Patient had leukocytosis, A. fib with RVR heart rate in the 130s, patient was started on Cardizem drip, chest x-ray showing right-sided pneumonia, with right hilum enlargement , as well patient report pleuritic right-sided chest pain with cough; troponins were negative, no EKG changes. In ED CT chest demonstrated  right lung mass with concerns for malignancy. Patient admitted for further evaluation and treatment.  Hospital Course:  Right lung mass (positive for invasive squamous cell lung cancer) -Pulmonology consult appreciated, bronchoscopy with biopsy done 2/24 and positive for invasive squamous cell lung cancer -ok to resume xarelto. -will need follow up with Dr. Julien Nordmann (thoracic oncology) for further evaluation and treatment options -patient has quit smoking and was advised to avoid second hand smoking as well  A. fib with RVR -Most likely related to sepsis from pneumonia. -Management as per cardiology, discussed with Dr. Wynonia Lawman -rate now controlled; plan is to resume home diltiazem and xarelto -BP soft, but stable and patient asymptomatic  Acute on chronic hypoxic respiratory failure/ COPD exacerbation/PNA -improve air movement, minimal exp wheezing at discharge -will continue steroids tapering, Continue nebs, continue antibiotics -will also continue mucinex and continue flutter valve -home oxygen arrange at discharge  Chest pain - Appears to be pleuritic in nature secondary to cough/PNA. -EKG showing no significant ST abnormalities -cardiology on board; no work up for ischemia needed -troponin neg  Community acquired pneumonia/postobstructive pneumonia/early sepsis -Continue with levofloxacin to complete antibiotic therapy -no fever, breathing better and with normal WBC's -continue PRN nebulizer therapy and symbicort -advise to use flutter valve  Dehydration -resolved. -will resume lasix at lower dose -patient advise to follow daily weights  GERD/esophagitis and sensation of stuck food -had hx of esophageal stricture and dilatation in the past -unable to swallow and with sensation of food stuck in his throat -GI consulted and EGD for disimpaction was performed; no food appreciated and just extensive swelling and irritation.  Most likely from bronchoscopy on 2/24 -ok to advance  diet to mechanical soft and later on advance as tolerated -continue PPI and carafate for esophagitis treatment  Procedures:  FOB on 11/29/14:pending   2-D echo 11/26/14: - Left ventricle: The cavity size was normal. Wall thickness was increased in a pattern of moderate LVH. Systolic function was normal. The estimated ejection fraction was in the range of 50% to 55%. - Aortic valve: Valve area (Vmax): 3.12 cm^2. - Left atrium: The atrium was mildly dilated. - Right atrium: The atrium was mildly dilated. - Atrial septum: No defect or patent foramen ovale was identified.   EGD: 2/25 -Extensive upper esophageal sphincter swelling, possibly result of patient's bronchoscopy yesterday.  -No food in esophagus.  -schatzki ring appreciated -Extensive food in stomach.  Consultations:  Cardiology  GI  PCCM  Discharge Exam: Filed Vitals:   12/01/14 0721  BP: 98/52  Pulse: 75  Temp: 97.4 F (36.3 C)  Resp: 23   Gen: Awake Alert, Oriented X 3, No focal motor or sensory deficits; feeling better and breathing easier. Reports that his swallowing capacity has improved and will like to try mechanical soft diet. Lungs: no crackles; very minimum exp wheezing and diminish BS at bases  Heart: positive SEM, no rubs or gallops; no edema; rate controlled Abd: soft, NT, ND, positive BS Extremities:No Cyanosis, Clubbing or edema, No new Rash or bruise   Discharge Instructions   Discharge Instructions    Diet - low sodium heart healthy    Complete by:  As directed      Discharge instructions    Complete by:  As directed   Take medications as prescribed Maintain adequate hydration Please follow with Dr. Julien Nordmann (call office for appointment details) Follow a heart healthy (mechanical soft diet) Keep oxygen supplementation (3L) by nasal cannula 24/7 especially during activity     Increase activity slowly    Complete by:  As directed           Current Discharge Medication  List    START taking these medications   Details  levofloxacin (LEVAQUIN) 750 MG tablet Take 1 tablet (750 mg total) by mouth daily. Qty: 4 tablet, Refills: 0    pantoprazole (PROTONIX) 40 MG tablet Take 1 tablet (40 mg total) by mouth 2 (two) times daily. Qty: 60 tablet, Refills: 1    predniSONE (DELTASONE) 20 MG tablet Take 2 tablets by mouth daily X 1 day; then 1 tablet by mouth daily X 3 days; then 1/2 tablet by mouth daily X 3 days and stop prednsione Qty: 10 tablet, Refills: 0    sucralfate (CARAFATE) 1 GM/10ML suspension Take 10 mLs (1 g total) by mouth 3 (three) times daily. Qty: 420 mL, Refills: 0      CONTINUE these medications which have CHANGED   Details  furosemide (LASIX) 40 MG tablet Take 0.5 tablets (20 mg total) by mouth daily.      CONTINUE these medications which have NOT CHANGED   Details  albuterol (PROVENTIL HFA;VENTOLIN HFA) 108 (90 BASE) MCG/ACT inhaler Inhale 1-2 puffs into the lungs every 6 (six) hours as needed for wheezing or shortness of breath.    budesonide-formoterol (SYMBICORT) 160-4.5 MCG/ACT inhaler Inhale 2 puffs into the lungs 2 (two) times daily.    diltiazem (CARDIZEM) 90 MG tablet Take 90 mg by mouth 2 (two) times daily. Take two tablets in the morning and take one tablet in the afternoon.    diphenhydramine-acetaminophen (TYLENOL PM) 25-500 MG TABS  Take 2 tablets by mouth at bedtime as needed.    rivaroxaban (XARELTO) 20 MG TABS tablet Take 20 mg by mouth daily with supper.    zolpidem (AMBIEN) 10 MG tablet Take 5 mg by mouth at bedtime as needed for sleep.      STOP taking these medications     famotidine (PEPCID) 20 MG tablet        Allergies  Allergen Reactions  . Codeine Itching   Follow-up Information    Follow up with BURNETT,BRENT A, MD. Schedule an appointment as soon as possible for a visit in 2 weeks.   Specialty:  Family Medicine   Contact information:   [place]  [address] [phone]       Follow up with Eilleen Kempf., MD.   Specialty:  Oncology   Why:  office for appointment details    Contact information:   [place] [address] [phone]       The results of significant diagnostics from this hospitalization (including imaging, microbiology, ancillary and laboratory) are listed below for reference.    Significant Diagnostic Studies: Ct Chest W Contrast  11/26/2014   CLINICAL DATA:  Abnormal chest radiograph with question RIGHT hilar mass versus adenopathy and RIGHT lung infiltrates  EXAM: CT CHEST WITH CONTRAST  TECHNIQUE: Multidetector CT imaging of the chest was performed during intravenous contrast administration. Sagittal and coronal MPR images reconstructed from axial data set.  CONTRAST:  72mL OMNIPAQUE IOHEXOL 300 MG/ML  SOLN IV  COMPARISON:  Chest radiograph 11/25/2014, CT chest 04/06/2011  FINDINGS: Atherosclerotic calcifications aorta and coronary arteries.  Aneurysmal dilatation ascending thoracic aorta 4.2 x 4.3 cm image 31.  Calcified subcarinal lymph node.  Additional minimally enlarged noncalcified subcarinal lymph node 11 mm short axis image 29.  Normal sized precarinal and prevascular lymph nodes.  Nonobstructing RIGHT renal calculi.  Small cyst upper pole LEFT kidney 2.4 x 2.3 cm image 63.  Post cholecystectomy.  Large RIGHT suprahilar mass invading the RIGHT hilum, 6.5 x 4.8 x 6.4 cm.  Mass occludes the RIGHT upper lobe bronchus.  Suspected confluent RIGHT hilar lymph nodes.  Extensive consolidation identified in the anterior segment of the RIGHT upper lobe with less consolidation seen in the RIGHT middle lobe, may represent postobstructive pneumonitis though lymphangitic tumor spread not completely excluded.  Stellate nodular density RIGHT upper lobe 8 mm diameter image 25 unchanged.  5 mm RIGHT lower lobe nodule image 48 unchanged.  Additional RIGHT lower lobe nodule 4 mm diameter image 53 unchanged.  7 mm LEFT  lower lobe nodule image 43 probably unchanged 6 mm RIGHT lower lobe nodule image 38 stability uncertain.  Small RIGHT pleural effusion.  Underlying severe emphysematous changes greatest in upper lobes.  No definite osseous metastatic lesions.  IMPRESSION: Large medial RIGHT upper lobe suprahilar mass 6.5 x 4.8 6.4 cm invading the RIGHT hilum compatible prior brain neoplasm.  Probable confluent RIGHT hilar adenopathy.  Pulmonary nodules bilaterally, some of which are stable, additional which are of uncertain stability due to presence of infiltrates on previous exam.  Postobstructive pneumonitis involving the anterior segment RIGHT upper lobe and medial segment of RIGHT middle lobe, though lymphangitic tumor spread not excluded.   Electronically Signed   By: Lavonia Dana M.D.   On: 11/26/2014 11:21   Dg Chest Port 1 View  11/26/2014   CLINICAL DATA:  Subsequent evaluation right upper lobe mass and right upper lobe consolidation  EXAM: PORTABLE  CHEST - 1 VIEW  COMPARISON:  11/26/2014, 11/25/2014  FINDINGS: Stable cardiac enlargement. Stable consolidation right upper lobe with right hilar fullness. Stable extensive right middle lobe infiltrate. Left lung appears clear.  IMPRESSION: No change from prior chest radiograph with known right lung mass, right upper lobe consolidation, and right middle lobe infiltrate.   Electronically Signed   By: Skipper Cliche M.D.   On: 11/26/2014 18:23   Dg Chest Port 1 View  11/25/2014   CLINICAL DATA:  Cough, shortness of breath and RIGHT-sided chest pain since 0300 hours today, history hypertension, COPD, coronary artery disease, atrial fibrillation, former smoker  EXAM: PORTABLE CHEST - 1 VIEW  COMPARISON:  04/05/2011; correlation CT chest 04/06/2011  FINDINGS: Normal heart size and pulmonary vascularity.  Enlargement of RIGHT hilum with questionable adenopathy versus suprahilar/perihilar mass.  Infiltrate identified in the RIGHT perihilar region and RIGHT lower lobe, unable to  exclude underlying mass lesion.  Remaining lungs emphysematous but clear.  No pleural effusion or pneumothorax.  IMPRESSION: COPD changes with RIGHT perihilar and lower lobe infiltrate infiltrate with RIGHT hilar enlargement suspicious for adenopathy or tumor.  Further evaluation by CT chest with contrast recommended to assess for potential malignancy.   Electronically Signed   By: Lavonia Dana M.D.   On: 11/25/2014 13:26    Microbiology: Recent Results (from the past 240 hour(s))  MRSA PCR Screening     Status: None   Collection Time: 11/25/14  4:12 PM  Result Value Ref Range Status   MRSA by PCR NEGATIVE NEGATIVE Final    Comment:        The GeneXpert MRSA Assay (FDA approved for NASAL specimens only), is one component of a comprehensive MRSA colonization surveillance program. It is not intended to diagnose MRSA infection nor to guide or monitor treatment for MRSA infections.   Culture, sputum-assessment     Status: None   Collection Time: 11/25/14  7:00 PM  Result Value Ref Range Status   Specimen Description SPUTUM  Final   Special Requests NONE  Final   Sputum evaluation   Final    THIS SPECIMEN IS ACCEPTABLE. RESPIRATORY CULTURE REPORT TO FOLLOW.   Report Status 11/25/2014 FINAL  Final  Culture, respiratory (NON-Expectorated)     Status: None   Collection Time: 11/25/14  7:00 PM  Result Value Ref Range Status   Specimen Description SPUTUM  Final   Special Requests NONE  Final   Gram Stain   Final    RARE WBC PRESENT, PREDOMINANTLY PMN RARE SQUAMOUS EPITHELIAL CELLS PRESENT RARE YEAST RARE GRAM POSITIVE COCCI IN PAIRS IN CLUSTERS RARE GRAM NEGATIVE RODS    Culture   Final    NORMAL OROPHARYNGEAL FLORA Performed at [facility]    Report Status 11/28/2014 FINAL  Final     Labs: Basic Metabolic Panel:  Recent Labs Lab 11/25/14 1220 11/26/14 0340 11/27/14 0113 11/28/14 0339 11/29/14 0340  NA 134* 134* 134* 135 138  K 3.7 3.7 3.4* 3.6 3.7  CL 100 103  107 106 108  CO2 25 24 23 21 23   GLUCOSE 112* 91 100* 102* 164*  BUN 21 19 14 10 12   CREATININE 1.37* 1.32 1.05 1.04 1.02  CALCIUM 8.9 8.3* 8.0* 8.3* 8.6   CBC:  Recent Labs Lab 11/27/14 0113 11/28/14 0339 11/29/14 0340 11/30/14 0314 12/01/14 0308  WBC 10.8* 9.9 7.9 20.5* 13.7*  HGB 10.2* 9.9* 9.3* 10.2* 9.8*  HCT 32.1* 32.2* 30.0* 33.5* 32.5*  MCV 79.9 81.3 79.8  82.1 81.9  PLT 182 191 165 243 208   Cardiac Enzymes:  Recent Labs Lab 11/26/14 0920  TROPONINI <0.03   BNP: BNP (last 3 results)  Recent Labs  11/25/14 1220  BNP 119.9*    Signed:  Barton Dubois  [facility] 12/01/2014, 12:06 PM

## 2014-12-01 NOTE — Progress Notes (Signed)
Patient was walked up and down hallway without  02  And his sat 's dropped down to 86-88 called MD and follow is to walk patient with 02  And call him back.

## 2014-12-01 NOTE — Progress Notes (Signed)
Subjective:  Biopsy report returned as positive for squamous cell cancer.  He is still IV heparin.  He evidently had a food impaction yesterday that spontaneously resolved.  I would place him back on XARELTO and stop his heparin.  His atrial fibrillation rate is well controlled.  He has no shortness of breath or chest pain.  Objective:  Vital Signs in the last 24 hours: BP 98/52 mmHg  Pulse 75  Temp(Src) 97.4 F (36.3 C) (Oral)  Resp 23  Ht 5\' 10"  (1.778 m)  Wt 93.5 kg (206 lb 2.1 oz)  BMI 29.58 kg/m2  SpO2 93%  Physical Exam: Pleasant WM in NAD Lungs:  Decreased BS right lung  Cardiac:  irregular rhythm, normal S1 and S2, no S3 Extremities:  No edema present  Intake/Output from previous day: 02/25 0701 - 02/26 0700 In: 1667.8 [I.V.:1667.8] Out: 1550 [Urine:1550] Weight Filed Weights   11/27/14 0354 11/29/14 0500 11/30/14 0500  Weight: 93.441 kg (206 lb) 97 kg (213 lb 13.5 oz) 93.5 kg (206 lb 2.1 oz)    Lab Results: Basic Metabolic Panel:  Recent Labs  11/29/14 0340  NA 138  K 3.7  CL 108  CO2 23  GLUCOSE 164*  BUN 12  CREATININE 1.02    CBC:  Recent Labs  11/30/14 0314 12/01/14 0308  WBC 20.5* 13.7*  HGB 10.2* 9.8*  HCT 33.5* 32.5*  MCV 82.1 81.9  PLT 243 208    BNP    Component Value Date/Time   BNP 119.9* 11/25/2014 1220   Telemetry: Atrial fibrillation rate currently controlled   Assessment/Plan:  1.  Chronic atrial fibrillation rate controlled 2.  Postobstructive pneumonia 3.  Squamous cell lung cancer  Recommendations:  Restart XARELTO.  Okay for discharge from cardiac viewpoint.  At this point I don't think he needs to go home on Lanoxin.  Resume his home dose of diltiazem.   Kerry Hough  MD [facility]  12/01/2014, 9:00 AM

## 2014-12-01 NOTE — Progress Notes (Signed)
Patient is now able to tolerate soft diet.  Patient tells me he is being discharged home today.  Will sign-off; please call with questions.

## 2014-12-01 NOTE — Progress Notes (Signed)
Patient walked in hallway with 02 at 2 liters sat's in the mid to high 80's(86-87) MD made aware

## 2014-12-01 NOTE — Progress Notes (Signed)
ANTICOAGULATION CONSULT NOTE - Follow Up Consult  Pharmacy Consult for heparin Indication: atrial fibrillation  Labs:  Recent Labs  11/29/14 0340 11/29/14 0355 11/30/14 0314 12/01/14 0308  HGB 9.3*  --  10.2* 9.8*  HCT 30.0*  --  33.5* 32.5*  PLT 165  --  243 208  HEPARINUNFRC  --  <0.10* 0.30 0.20*  CREATININE 1.02  --   --   --      Assessment: 81yo male now subtherapeutic on heparin after one level at low end of goal; had been turned off yesterday for EGD though gtt has been running without issue since ~1600.  Goal of Therapy:  Heparin level 0.3-0.7 units/ml   Plan:  Will increase heparin gtt slightly to 1300 units/hr and check level in [place], PharmD, BCPS  12/01/2014,4:48 AM

## 2014-12-03 ENCOUNTER — Encounter: Payer: Self-pay | Admitting: Cardiology

## 2014-12-03 DIAGNOSIS — I7121 Aneurysm of the ascending aorta, without rupture: Secondary | ICD-10-CM

## 2014-12-03 DIAGNOSIS — I712 Thoracic aortic aneurysm, without rupture: Secondary | ICD-10-CM

## 2014-12-03 DIAGNOSIS — Z7901 Long term (current) use of anticoagulants: Secondary | ICD-10-CM | POA: Insufficient documentation

## 2014-12-03 DIAGNOSIS — I2510 Atherosclerotic heart disease of native coronary artery without angina pectoris: Secondary | ICD-10-CM

## 2014-12-03 HISTORY — DX: Atherosclerotic heart disease of native coronary artery without angina pectoris: I25.10

## 2014-12-03 HISTORY — DX: Thoracic aortic aneurysm, without rupture: I71.2

## 2014-12-03 HISTORY — DX: Aneurysm of the ascending aorta, without rupture: I71.21

## 2014-12-04 ENCOUNTER — Encounter: Payer: Self-pay | Admitting: *Deleted

## 2014-12-04 NOTE — [facility] Oncology Navigator Note (Unsigned)
Received a vm message from Mr. Mengel family stating he was to follow up with Dr. Julien Nordmann due to history of lung cancer.  I Rudie Meyer, HIM dept to schedule with Dr. Julien Nordmann on 12/12/14.

## 2014-12-05 ENCOUNTER — Telehealth: Payer: Self-pay | Admitting: Internal Medicine

## 2014-12-05 NOTE — Telephone Encounter (Signed)
Left vm in ref to np appt. On 12/12/14@11 :00-per Dana. Asked pt to return call to confirm appt.

## 2014-12-07 ENCOUNTER — Telehealth: Payer: Self-pay | Admitting: *Deleted

## 2014-12-07 ENCOUNTER — Other Ambulatory Visit: Payer: Self-pay | Admitting: *Deleted

## 2014-12-07 ENCOUNTER — Encounter: Payer: Self-pay | Admitting: *Deleted

## 2014-12-07 DIAGNOSIS — C3491 Malignant neoplasm of unspecified part of right bronchus or lung: Secondary | ICD-10-CM

## 2014-12-07 NOTE — Telephone Encounter (Signed)
Made referral to Rad Onc per Dr. Julien Nordmann.  I called and spoke with Santiago Glad to obtain appt time, she gave me a time and date.  I called patient and gave him appt time and date.  He verbalized understanding of appt time and place.

## 2014-12-07 NOTE — [facility] Oncology Navigator Note (Unsigned)
Called [facility] dept to follow up from cancer conference.  I updated Tammy regarding Dr. Julien Nordmann request for additional test, PDL 1 Polly Cobia).  She verbalized understanding

## 2014-12-08 NOTE — Progress Notes (Signed)
Thoracic Location of Tumor / Histology: large right medial upper lobe suprahilar lung mass - squamous cell carcinoma  Patient presented per Dr. Lamonte Sakai "he was admitted on 2/20 w/ CC: shortness of breath w/ associated right sided pleuritic chest discomfort, cough productive of yellow sputum, and subjective chills. On evaluation in the emergency room he was found to have atrial fibrillation with rapid ventricular response. Initial chest x-ray demonstrated right perihilar infiltrate, right lower lobe airspace disease. CT scan was recommended and obtained. This demonstrated a large right upper lobe suprahilar mass measuring 6.5 x 4.8 x 6.4 cm invading the right hilum. Also demonstrated findings consistent with postobstructive pneumonitis involving the right middle lobe, upper lobe."  Biopsies revealed:   11/29/14 Diagnosis 1. Endobronchial biopsy, RUL - INVASIVE SQUAMOUS CELL CARCINOMA. - SEE COMMENT. 2. Endobronchial biopsy, RML orifice lesion - BENIGN CARTILAGE AND RESPIRATORY MUCOSA. - THERE IS NO EVIDENCE OF MALIGNANCY.  Diagnosis BRONCHIAL BRUSHING, RUL MASS (SPECIMEN 1 OF 2 COLLECTED 11-29-2014) MALIGNANT CELLS PRESENT, CONSISTENT WITH SQUAMOUS CELL CARCINOMA.  Diagnosis BRONCHIAL BRUSHING, RML ORIFICE LESION (SPECIMEN 2 OF 2 COLLECTED 11-29-2014) NO MALIGNANT CELLS IDENTIFIED.  Tobacco/Marijuana/Snuff/ETOH use: smoked 2 packs per day for 65 years, did not use snuff, used to drink 2-3 drinks per night - quit 4-5 years ago  Past/Anticipated interventions by cardiothoracic surgery, if any: 11/29/14 - Procedure: VIDEO BRONCHOSCOPY WITHOUT FLUORO;  Surgeon: Collene Gobble, MD;  Location: [facility] ENDOSCOPY;  Service: Cardiopulmonary;  Laterality: Bilateral;  Past/Anticipated interventions by medical oncology, if any: apt with Dr. Julien Nordmann on 12/12/14 - will have 5-7 cycles of chemotherapy  Signs/Symptoms  Weight changes, if any: yes- has lost about 12 lbs.  Reports a good appetite.   Respiratory  complaints, if any: denies coughing, reports shortness of breath with activity.  Hemoptysis, if any: no  Pain issues, if any:  no  SAFETY ISSUES:  Prior radiation? 2006 to his prostate  Pacemaker/ICD? no  Possible current pregnancy?no  Is the patient on methotrexate? no  Current Complaints / other details:  Patient is here with his granddaughter.  He will have PET scan on 3/18 and MRI of brain on 3/16.

## 2014-12-11 ENCOUNTER — Encounter (HOSPITAL_COMMUNITY): Payer: Self-pay

## 2014-12-11 ENCOUNTER — Other Ambulatory Visit: Payer: Self-pay | Admitting: Medical Oncology

## 2014-12-11 DIAGNOSIS — C3490 Malignant neoplasm of unspecified part of unspecified bronchus or lung: Secondary | ICD-10-CM

## 2014-12-12 ENCOUNTER — Ambulatory Visit: Payer: Medicare Other

## 2014-12-12 ENCOUNTER — Encounter: Payer: Self-pay | Admitting: Internal Medicine

## 2014-12-12 ENCOUNTER — Other Ambulatory Visit (HOSPITAL_BASED_OUTPATIENT_CLINIC_OR_DEPARTMENT_OTHER): Payer: Medicare Other

## 2014-12-12 ENCOUNTER — Ambulatory Visit (HOSPITAL_BASED_OUTPATIENT_CLINIC_OR_DEPARTMENT_OTHER): Payer: Medicare Other | Admitting: Internal Medicine

## 2014-12-12 ENCOUNTER — Telehealth: Payer: Self-pay | Admitting: Internal Medicine

## 2014-12-12 VITALS — BP 99/52 | HR 77 | Temp 97.7°F | Resp 18 | Ht 70.0 in | Wt 195.6 lb

## 2014-12-12 DIAGNOSIS — C3491 Malignant neoplasm of unspecified part of right bronchus or lung: Secondary | ICD-10-CM

## 2014-12-12 DIAGNOSIS — C3490 Malignant neoplasm of unspecified part of unspecified bronchus or lung: Secondary | ICD-10-CM

## 2014-12-12 LAB — COMPREHENSIVE METABOLIC PANEL (CC13)
ALT: 16 U/L (ref 0–55)
ANION GAP: 9 meq/L (ref 3–11)
AST: 15 U/L (ref 5–34)
Albumin: 2.7 g/dL — ABNORMAL LOW (ref 3.5–5.0)
Alkaline Phosphatase: 91 U/L (ref 40–150)
BILIRUBIN TOTAL: 0.3 mg/dL (ref 0.20–1.20)
BUN: 25.5 mg/dL (ref 7.0–26.0)
CO2: 27 mEq/L (ref 22–29)
CREATININE: 1.3 mg/dL (ref 0.7–1.3)
Calcium: 8.7 mg/dL (ref 8.4–10.4)
Chloride: 103 mEq/L (ref 98–109)
EGFR: 51 mL/min/{1.73_m2} — AB (ref >90)
Glucose: 153 mg/dl — ABNORMAL HIGH (ref 70–140)
POTASSIUM: 4.1 meq/L (ref 3.5–5.1)
Sodium: 139 mEq/L (ref 136–145)
Total Protein: 6.2 g/dL — ABNORMAL LOW (ref 6.4–8.3)

## 2014-12-12 LAB — CBC WITH DIFFERENTIAL/PLATELET
BASO%: 0.2 % (ref 0.0–2.0)
Basophils Absolute: 0 10*3/uL (ref 0.0–0.1)
EOS%: 0 % (ref 0.0–7.0)
Eosinophils Absolute: 0 10*3/uL (ref 0.0–0.5)
HCT: 36.4 % — ABNORMAL LOW (ref 38.4–49.9)
HEMOGLOBIN: 11.1 g/dL — AB (ref 13.0–17.1)
LYMPH#: 0.5 10*3/uL — AB (ref 0.9–3.3)
LYMPH%: 4 % — ABNORMAL LOW (ref 14.0–49.0)
MCH: 24.5 pg — ABNORMAL LOW (ref 27.2–33.4)
MCHC: 30.5 g/dL — ABNORMAL LOW (ref 32.0–36.0)
MCV: 80.5 fL (ref 79.3–98.0)
MONO#: 0.5 10*3/uL (ref 0.1–0.9)
MONO%: 3.5 % (ref 0.0–14.0)
NEUT#: 12.7 10*3/uL — ABNORMAL HIGH (ref 1.5–6.5)
NEUT%: 92.3 % — ABNORMAL HIGH (ref 39.0–75.0)
Platelets: 199 10*3/uL (ref 140–400)
RBC: 4.51 10*6/uL (ref 4.20–5.82)
RDW: 16.5 % — AB (ref 11.0–14.6)
WBC: 13.8 10*3/uL — AB (ref 4.0–10.3)

## 2014-12-12 NOTE — Telephone Encounter (Signed)
Pt confirmed MD visit per 03/08 POF, gave pt AVS.. KJ

## 2014-12-12 NOTE — Progress Notes (Signed)
Checked in new pt with no financial concerns at this time.  Pt has my card for any billing questions or concerns. °

## 2014-12-12 NOTE — Progress Notes (Signed)
[place] [phone]   [phone]  CONSULT NOTE  REFERRING PHYSICIAN: Dr. Baltazar Apo  REASON FOR CONSULTATION:  81 years old white male recently diagnosed with lung cancer.  HPI SIE FORMISANO is a 81 y.o. male was past medical history significant for COPD, atrial fibrillation, history of prostate cancer status post radiotherapy in 2007 as well as long history of heavy smoking but quit 6 years ago. The patient mentions that 2 weeks ago he started complaining of increasing shortness of breath and right-sided chest pain. He was seen at the emergency department at [facility]. Chest x-ray on 11/25/2014 showed COPD changes with right perihilar and lower lobe infiltrate with right hilar enlargement suspicious for adenopathy or tumor. CT scan of the chest with contrast performed on 11/26/2014 showed large right suprahilar mass invading the right hilum measuring 6.5 x 4.8 x 6.4 cm. The mass occluded the right upper lobe bronchus. There was suspected confluent right hilar lymph nodes and extensive consolidation identified in the anterior segment of the right upper lobe with less consolidation seen in the right middle lobe was suspicious for postobstructive pneumonitis. There are satellite nodular density in the right upper lobe measuring 0.8 cm and 0.5 cm right lower lobe nodule. There was also additional right lower lobe nodule measuring 0.4 cm and left lower lobe nodule measuring 0.7 cm. There was also minimally enlarged noncalcified subcarinal lymph node measuring 1.1 cm in short axis. The patient was seen during his admission by Dr. Lamonte Sakai and he underwent a video bronchoscopy and biopsies on 11/29/2014 which showed occlusion of the right upper lobe from exophytic mass. The final pathology (Accession: [id number]) showed invasive squamous cell carcinoma. The immunohistochemical stain showed positive PDL 1 expression with the 22C3 antibody Nat Math test).    The patient was referred to me today for further evaluation and recommendation regarding treatment of his condition. He is currently on home oxygen for the shortness of breath but his oxygen saturation his usual around 97%. He has mild cough with no significant sputum production. He denied having any current chest pain or hemoptysis. He lost around 10 pounds in the last few weeks. The patient denied having any significant night sweats. He has no headache that has visual changes secondary to macular degeneration. He has no nausea or vomiting or change in his bowel movement. Family history significant for mother with throat cancer and daughter currently under treatment for small cell lung cancer. He is a widow with 3 daughters. He used to work as a Development worker, community. He has a history of smoking 1-2 packs per day for around 60 years but quit 6 years ago. He has no history of alcohol or drug abuse.   HPI  Past Medical History  Diagnosis Date  . Syncope   . COPD (chronic obstructive pulmonary disease)   . Chronic atrial fibrillation   . GERD (gastroesophageal reflux disease)   . Coronary artery disease   . Hypertension   . Kidney stone   . CAD (coronary artery disease), native coronary artery 12/03/2014  . Thoracic ascending aortic aneurysm 12/03/2014    4.2 x 4.3 cm noted on CT scan February 2016   . SCC (squamous cell carcinoma of lung)     Past Surgical History  Procedure Laterality Date  . Appendectomy    . Cholecystectomy    . Esophageal dilation    . Video bronchoscopy Bilateral 11/29/2014    Procedure: VIDEO BRONCHOSCOPY WITHOUT FLUORO;  Surgeon: Collene Gobble, MD;  Location: [facility];  Service: Cardiopulmonary;  Laterality: Bilateral;  . Esophagogastroduodenoscopy N/A 11/30/2014    Procedure: ESOPHAGOGASTRODUODENOSCOPY (EGD);  Surgeon: Arta Silence, MD;  Location: [facility] ENDOSCOPY;  Service: Endoscopy;  Laterality: N/A;    Family History  Problem Relation Age of Onset   . Stroke    . Cancer    . Heart attack Sister     Social History History  Substance Use Topics  . Smoking status: Former Smoker -- 0.00 packs/day for 65 years  . Smokeless tobacco: Not on file  . Alcohol Use: Yes     Comment: 2-3 every night    Allergies  Allergen Reactions  . Codeine Itching    Current Outpatient Prescriptions  Medication Sig Dispense Refill  . budesonide-formoterol (SYMBICORT) 160-4.5 MCG/ACT inhaler Inhale 2 puffs into the lungs 2 (two) times daily.    Marland Kitchen diltiazem (CARDIZEM) 90 MG tablet Take 90 mg by mouth 2 (two) times daily. Take two tablets in the morning and take one tablet in the afternoon.    . diphenhydramine-acetaminophen (TYLENOL PM) 25-500 MG TABS Take 2 tablets by mouth at bedtime as needed.    . furosemide (LASIX) 40 MG tablet Take 0.5 tablets (20 mg total) by mouth daily.    . rivaroxaban (XARELTO) 20 MG TABS tablet Take 20 mg by mouth daily with supper.    . zolpidem (AMBIEN) 10 MG tablet Take 5 mg by mouth at bedtime as needed for sleep.    Marland Kitchen albuterol (PROVENTIL HFA;VENTOLIN HFA) 108 (90 BASE) MCG/ACT inhaler Inhale 1-2 puffs into the lungs every 6 (six) hours as needed for wheezing or shortness of breath.    . pantoprazole (PROTONIX) 40 MG tablet Take 1 tablet (40 mg total) by mouth 2 (two) times daily. (Patient not taking: Reported on 12/12/2014) 60 tablet 1   No current facility-administered medications for this visit.    Review of Systems  Constitutional: positive for fatigue and weight loss Eyes: negative Ears, nose, mouth, throat, and face: negative Respiratory: positive for cough and dyspnea on exertion Cardiovascular: negative Gastrointestinal: negative Genitourinary:negative Integument/breast: negative Hematologic/lymphatic: negative Musculoskeletal:negative Neurological: negative Behavioral/Psych: negative Endocrine: negative Allergic/Immunologic: negative  Physical Exam  VPX:TGGYI, healthy, no distress, well nourished  and well developed SKIN: skin color, texture, turgor are normal, no rashes or significant lesions HEAD: Normocephalic, No masses, lesions, tenderness or abnormalities EYES: normal, PERRLA EARS: External ears normal, Canals clear OROPHARYNX:no exudate, no erythema and lips, buccal mucosa, and tongue normal  NECK: supple, no adenopathy, no JVD LYMPH:  no palpable lymphadenopathy, no hepatosplenomegaly LUNGS: prolonged expiratory phase, expiratory wheezes bilaterally HEART: regular rate & rhythm and no murmurs ABDOMEN:abdomen soft, non-tender, normal bowel sounds and no masses or organomegaly BACK: Back symmetric, no curvature., No CVA tenderness EXTREMITIES:no joint deformities, effusion, or inflammation, no edema, no skin discoloration  NEURO: alert & oriented x 3 with fluent speech, no focal motor/sensory deficits  PERFORMANCE STATUS: ECOG 1  LABORATORY DATA: Lab Results  Component Value Date   WBC 13.8* 12/12/2014   HGB 11.1* 12/12/2014   HCT 36.4* 12/12/2014   MCV 80.5 12/12/2014   PLT 199 12/12/2014      Chemistry      Component Value Date/Time   NA 139 12/12/2014 1124   NA 138 11/29/2014 0340   K 4.1 12/12/2014 1124   K 3.7 11/29/2014 0340   CL 108 11/29/2014 0340   CO2 27 12/12/2014 1124   CO2 23 11/29/2014 0340   BUN  25.5 12/12/2014 1124   BUN 12 11/29/2014 0340   CREATININE 1.3 12/12/2014 1124   CREATININE 1.02 11/29/2014 0340      Component Value Date/Time   CALCIUM 8.7 12/12/2014 1124   CALCIUM 8.6 11/29/2014 0340   ALKPHOS 91 12/12/2014 1124   ALKPHOS 73 04/09/2011 0419   AST 15 12/12/2014 1124   AST 42* 04/09/2011 0419   ALT 16 12/12/2014 1124   ALT 52 04/09/2011 0419   BILITOT 0.30 12/12/2014 1124   BILITOT 0.7 04/09/2011 0419       RADIOGRAPHIC STUDIES: Ct Chest W Contrast  11/26/2014   CLINICAL DATA:  Abnormal chest radiograph with question RIGHT hilar mass versus adenopathy and RIGHT lung infiltrates  EXAM: CT CHEST WITH CONTRAST  TECHNIQUE:  Multidetector CT imaging of the chest was performed during intravenous contrast administration. Sagittal and coronal MPR images reconstructed from axial data set.  CONTRAST:  31mL OMNIPAQUE IOHEXOL 300 MG/ML  SOLN IV  COMPARISON:  Chest radiograph 11/25/2014, CT chest 04/06/2011  FINDINGS: Atherosclerotic calcifications aorta and coronary arteries.  Aneurysmal dilatation ascending thoracic aorta 4.2 x 4.3 cm image 31.  Calcified subcarinal lymph node.  Additional minimally enlarged noncalcified subcarinal lymph node 11 mm short axis image 29.  Normal sized precarinal and prevascular lymph nodes.  Nonobstructing RIGHT renal calculi.  Small cyst upper pole LEFT kidney 2.4 x 2.3 cm image 63.  Post cholecystectomy.  Large RIGHT suprahilar mass invading the RIGHT hilum, 6.5 x 4.8 x 6.4 cm.  Mass occludes the RIGHT upper lobe bronchus.  Suspected confluent RIGHT hilar lymph nodes.  Extensive consolidation identified in the anterior segment of the RIGHT upper lobe with less consolidation seen in the RIGHT middle lobe, may represent postobstructive pneumonitis though lymphangitic tumor spread not completely excluded.  Stellate nodular density RIGHT upper lobe 8 mm diameter image 25 unchanged.  5 mm RIGHT lower lobe nodule image 48 unchanged.  Additional RIGHT lower lobe nodule 4 mm diameter image 53 unchanged.  7 mm LEFT lower lobe nodule image 43 probably unchanged 6 mm RIGHT lower lobe nodule image 38 stability uncertain.  Small RIGHT pleural effusion.  Underlying severe emphysematous changes greatest in upper lobes.  No definite osseous metastatic lesions.  IMPRESSION: Large medial RIGHT upper lobe suprahilar mass 6.5 x 4.8 6.4 cm invading the RIGHT hilum compatible prior brain neoplasm.  Probable confluent RIGHT hilar adenopathy.  Pulmonary nodules bilaterally, some of which are stable, additional which are of uncertain stability due to presence of infiltrates on previous exam.  Postobstructive pneumonitis involving the  anterior segment RIGHT upper lobe and medial segment of RIGHT middle lobe, though lymphangitic tumor spread not excluded.   Electronically Signed   By: Lavonia Dana M.D.   On: 11/26/2014 11:21   Dg Chest Port 1 View  11/26/2014   CLINICAL DATA:  Subsequent evaluation right upper lobe mass and right upper lobe consolidation  EXAM: PORTABLE CHEST - 1 VIEW  COMPARISON:  11/26/2014, 11/25/2014  FINDINGS: Stable cardiac enlargement. Stable consolidation right upper lobe with right hilar fullness. Stable extensive right middle lobe infiltrate. Left lung appears clear.  IMPRESSION: No change from prior chest radiograph with known right lung mass, right upper lobe consolidation, and right middle lobe infiltrate.   Electronically Signed   By: Skipper Cliche M.D.   On: 11/26/2014 18:23   Dg Chest Port 1 View  11/25/2014   CLINICAL DATA:  Cough, shortness of breath and RIGHT-sided chest pain since 0300 hours today, history hypertension, COPD, coronary  artery disease, atrial fibrillation, former smoker  EXAM: PORTABLE CHEST - 1 VIEW  COMPARISON:  04/05/2011; correlation CT chest 04/06/2011  FINDINGS: Normal heart size and pulmonary vascularity.  Enlargement of RIGHT hilum with questionable adenopathy versus suprahilar/perihilar mass.  Infiltrate identified in the RIGHT perihilar region and RIGHT lower lobe, unable to exclude underlying mass lesion.  Remaining lungs emphysematous but clear.  No pleural effusion or pneumothorax.  IMPRESSION: COPD changes with RIGHT perihilar and lower lobe infiltrate infiltrate with RIGHT hilar enlargement suspicious for adenopathy or tumor.  Further evaluation by CT chest with contrast recommended to assess for potential malignancy.   Electronically Signed   By: Lavonia Dana M.D.   On: 11/25/2014 13:26    ASSESSMENT: This is a very pleasant 81 years old white male recently diagnosed with at least a stage IIIA (T3, N1, M0) non-small cell lung cancer, squamous cell carcinoma with positive  PDL 1 expression, presented with large right upper lobe lung mass with hilar lymphadenopathy diagnosed in February 2016. The final staging workup is still pending.   PLAN: I had a lengthy discussion with the patient and his family today about his current disease stage, prognosis and treatment options. I recommended for the patient to complete the staging workup by ordering a PET scan as well as MRI of the brain to rule out other metastatic disease. If there is no evidence for disease metastasis, we will consider the patient for a course of concurrent chemoradiation with weekly carboplatin for AUC of 2 and paclitaxel 45 MG/M2. I had arranged for the patient to see Dr. Sondra Come and few days for evaluation and discussion of the radiotherapy option. I will see the patient back for follow-up visit in one week for reevaluation and more detailed discussion of his treatment options based on the final staging workup. For COPD, the patient will continue on Symbicort and albuterol inhaler. For the history of atrial fibrillation, the patient will continue on Xarelto. He was advised to call immediately if he has any concerning symptoms in the interval.  The patient voices understanding of current disease status and treatment options and is in agreement with the current care plan.  All questions were answered. The patient knows to call the clinic with any problems, questions or concerns. We can certainly see the patient much sooner if necessary.  Thank you so much for allowing me to participate in the care of [name]. I will continue to follow up the patient with you and assist in his care.  I spent 40 minutes counseling the patient face to face. The total time spent in the appointment was 60 minutes.  Disclaimer: This note was dictated with voice recognition software. Similar sounding words can inadvertently be transcribed and may not be corrected upon review.   Kimie Pidcock K. December 12, 2014,  12:36 PM

## 2014-12-13 ENCOUNTER — Ambulatory Visit
Admission: RE | Admit: 2014-12-13 | Discharge: 2014-12-13 | Disposition: A | Discharge status: Home / Self Care | Payer: Medicare Other | Source: Ambulatory Visit | Attending: Radiation Oncology | Admitting: Radiation Oncology

## 2014-12-13 ENCOUNTER — Encounter: Payer: Self-pay | Admitting: Radiation Oncology

## 2014-12-13 ENCOUNTER — Telehealth: Payer: Self-pay | Admitting: Internal Medicine

## 2014-12-13 VITALS — BP 112/66 | HR 75 | Temp 98.1°F | Resp 20 | Ht 70.0 in | Wt 194.7 lb

## 2014-12-13 DIAGNOSIS — R591 Generalized enlarged lymph nodes: Secondary | ICD-10-CM | POA: Insufficient documentation

## 2014-12-13 DIAGNOSIS — I10 Essential (primary) hypertension: Secondary | ICD-10-CM | POA: Diagnosis not present

## 2014-12-13 DIAGNOSIS — C3491 Malignant neoplasm of unspecified part of right bronchus or lung: Secondary | ICD-10-CM

## 2014-12-13 DIAGNOSIS — I712 Thoracic aortic aneurysm, without rupture: Secondary | ICD-10-CM | POA: Insufficient documentation

## 2014-12-13 DIAGNOSIS — Z7951 Long term (current) use of inhaled steroids: Secondary | ICD-10-CM | POA: Insufficient documentation

## 2014-12-13 DIAGNOSIS — Z7901 Long term (current) use of anticoagulants: Secondary | ICD-10-CM | POA: Diagnosis not present

## 2014-12-13 DIAGNOSIS — Z801 Family history of malignant neoplasm of trachea, bronchus and lung: Secondary | ICD-10-CM | POA: Diagnosis not present

## 2014-12-13 DIAGNOSIS — I2510 Atherosclerotic heart disease of native coronary artery without angina pectoris: Secondary | ICD-10-CM | POA: Insufficient documentation

## 2014-12-13 DIAGNOSIS — I482 Chronic atrial fibrillation: Secondary | ICD-10-CM | POA: Diagnosis not present

## 2014-12-13 DIAGNOSIS — Z510 Encounter for antineoplastic radiation therapy: Secondary | ICD-10-CM | POA: Diagnosis present

## 2014-12-13 DIAGNOSIS — Z87891 Personal history of nicotine dependence: Secondary | ICD-10-CM | POA: Insufficient documentation

## 2014-12-13 DIAGNOSIS — Z808 Family history of malignant neoplasm of other organs or systems: Secondary | ICD-10-CM | POA: Insufficient documentation

## 2014-12-13 DIAGNOSIS — K219 Gastro-esophageal reflux disease without esophagitis: Secondary | ICD-10-CM | POA: Diagnosis not present

## 2014-12-13 DIAGNOSIS — L599 Disorder of the skin and subcutaneous tissue related to radiation, unspecified: Secondary | ICD-10-CM | POA: Insufficient documentation

## 2014-12-13 DIAGNOSIS — Z9079 Acquired absence of other genital organ(s): Secondary | ICD-10-CM | POA: Diagnosis not present

## 2014-12-13 DIAGNOSIS — J449 Chronic obstructive pulmonary disease, unspecified: Secondary | ICD-10-CM | POA: Diagnosis not present

## 2014-12-13 DIAGNOSIS — Z9049 Acquired absence of other specified parts of digestive tract: Secondary | ICD-10-CM | POA: Insufficient documentation

## 2014-12-13 DIAGNOSIS — Z923 Personal history of irradiation: Secondary | ICD-10-CM | POA: Insufficient documentation

## 2014-12-13 DIAGNOSIS — C3411 Malignant neoplasm of upper lobe, right bronchus or lung: Secondary | ICD-10-CM | POA: Insufficient documentation

## 2014-12-13 NOTE — Progress Notes (Signed)
Please see the Nurse Progress Note in the MD Initial Consult Encounter for this patient. 

## 2014-12-13 NOTE — Progress Notes (Signed)
Radiation Oncology         [phone] ________________________________  Initial outpatient Consultation  Name: [name] MRN: [id number]  Date: 12/13/2014  DOB: 1933-02-20  TK:WIOXBDZ,HGDJM A, MD  Curt Bears, MD   REFERRING PHYSICIAN: Curt Bears, MD  DIAGNOSIS: at least a stage IIIA (T3, N1, M0) non-small cell lung cancer, squamous cell carcinoma, presenting with a large right upper lobe lung mass with hilar lymphadenopathy diagnosed in February 2016. The final staging workup is  pending PET scan and MRI the brain  HISTORY OF PRESENT ILLNESS::[name] is a 81 y.o. male who is seen out of the courtesy of Dr. Julien Nordmann for an opinion concerning radiation therapy as part of management of the patient's locally advanced non-small cell lung cancer..The patient mentions that 2 weeks ago he started complaining of increasing shortness of breath and right-sided chest pain. He was seen at the emergency department at [facility]. Chest x-ray on 11/25/2014 showed COPD changes with right perihilar and lower lobe infiltrate with right hilar enlargement suspicious for adenopathy or tumor. CT scan of the chest with contrast performed on 11/26/2014 showed large right suprahilar mass invading the right hilum measuring 6.5 x 4.8 x 6.4 cm. The mass occluded the right upper lobe bronchus. There was suspected confluent right hilar lymph nodes and extensive consolidation identified in the anterior segment of the right upper lobe with less consolidation seen in the right middle lobe was suspicious for postobstructive pneumonitis.. There was also additional right lower lobe nodule measuring 0.4 cm and left lower lobe nodule measuring 0.7 cm. There was also minimally enlarged noncalcified subcarinal lymph node measuring 1.1 cm in short axis. The patient was seen during his admission by Dr. Lamonte Sakai and he underwent a video bronchoscopy and biopsies on 11/29/2014 which showed occlusion of the right  upper lobe from exophytic mass. The final pathology (Accession: [id number]) showed invasive squamous cell carcinoma.  PREVIOUS RADIATION THERAPY: Yes for prostate cancer. The patient completed 7800 cGy in 40 sessions of the direction of Dr. Valere Dross in 2007. The patient reports his last PSA was less than 1. The patient was diagnosed with stage TIc.  PAST MEDICAL HISTORY:  has a past medical history of Syncope; COPD (chronic obstructive pulmonary disease); Chronic atrial fibrillation; GERD (gastroesophageal reflux disease); Coronary artery disease; Hypertension; Kidney stone; CAD (coronary artery disease), native coronary artery (12/03/2014); Thoracic ascending aortic aneurysm (12/03/2014); and SCC (squamous cell carcinoma of lung).    PAST SURGICAL HISTORY: Past Surgical History  Procedure Laterality Date  . Appendectomy    . Cholecystectomy    . Esophageal dilation  2012  . Video bronchoscopy Bilateral 11/29/2014    Procedure: VIDEO BRONCHOSCOPY WITHOUT FLUORO;  Surgeon: Collene Gobble, MD;  Location: [place] [facility];  Service: Cardiopulmonary;  Laterality: Bilateral;  . Esophagogastroduodenoscopy N/A 11/30/2014    Procedure: ESOPHAGOGASTRODUODENOSCOPY (EGD);  Surgeon: Arta Silence, MD;  Location: [facility] ENDOSCOPY;  Service: Endoscopy;  Laterality: N/A;    FAMILY HISTORY: family history includes Cancer in some other family members; Heart attack in his sister; Lung cancer in his daughter; Stroke in an other family member. Family history significant for mother with throat cancer and daughter currently under treatment for small cell lung cancer.  He is a widow with 3 daughters.  SOCIAL HISTORY:  reports that he quit smoking about 5 years ago. His smoking use included Cigarettes. He has a 130 pack-year smoking history. He has never used smokeless tobacco. He reports that he does not drink alcohol.  He used to work as a Development worker, community. He has a history of smoking 1-2 packs per day for around 60 years  but quit 6 years ago. He has no history of alcohol or drug abuse.  ALLERGIES: Codeine  MEDICATIONS:  Current Outpatient Prescriptions  Medication Sig Dispense Refill  . albuterol (PROVENTIL HFA;VENTOLIN HFA) 108 (90 BASE) MCG/ACT inhaler Inhale 1-2 puffs into the lungs every 6 (six) hours as needed for wheezing or shortness of breath.    . budesonide-formoterol (SYMBICORT) 160-4.5 MCG/ACT inhaler Inhale 2 puffs into the lungs 2 (two) times daily.    Marland Kitchen diltiazem (CARDIZEM) 90 MG tablet Take 90 mg by mouth 2 (two) times daily. Take two tablets in the morning and take one tablet in the afternoon.    . diphenhydramine-acetaminophen (TYLENOL PM) 25-500 MG TABS Take 2 tablets by mouth at bedtime as needed.    . furosemide (LASIX) 40 MG tablet Take 0.5 tablets (20 mg total) by mouth daily.    . potassium chloride SA (K-DUR,KLOR-CON) 20 MEQ tablet Take 20 mEq by mouth daily.    . rivaroxaban (XARELTO) 20 MG TABS tablet Take 20 mg by mouth daily with supper.    . zolpidem (AMBIEN) 10 MG tablet Take 5 mg by mouth at bedtime as needed for sleep.    . pantoprazole (PROTONIX) 40 MG tablet Take 1 tablet (40 mg total) by mouth 2 (two) times daily. (Patient not taking: Reported on 12/12/2014) 60 tablet 1   No current facility-administered medications for this encounter.    REVIEW OF SYSTEMS:  A 15 point review of systems is documented in the electronic medical record. This was obtained by the nursing staff. However, I reviewed this with the patient to discuss relevant findings and make appropriate changes. His vision is severely limited by macular degeneration. He is unable to drive or even read books at this time. He is unable to watch TV. He is currently on home oxygen for the shortness of breath but his oxygen saturation his usual around 97%. He has mild cough with no significant sputum production. He denied having any current chest pain or hemoptysis. He lost around 10 pounds in the last few weeks. The patient  denied having any significant night sweats. He has no headache.   PHYSICAL EXAM:  height is 5\' 10"  (1.778 m) and weight is 194 lb 11.2 oz (88.315 kg). His oral temperature is 98.1 F (36.7 C). His blood pressure is 112/66 and his pulse is 75. His respiration is 20 and oxygen saturation is 100%.   BP 112/66 mmHg  Pulse 75  Temp(Src) 98.1 F (36.7 C) (Oral)  Resp 20  Ht 5\' 10"  (1.778 m)  Wt 194 lb 11.2 oz (88.315 kg)  BMI 27.94 kg/m2  SpO2 100%  General Appearance:    Alert, cooperative, no distress, appears stated age, accompanied by granddaughter on evaluation today   Head:    Normocephalic, without obvious abnormality, atraumatic  Eyes:    PERRL, conjunctiva/corneas clear, EOM's intact,           Nose:   Nares normal, septum midline, mucosa normal, no drainage    or sinus tenderness  Throat:   Lips, mucosa, and tongue normal; dentures present and gums normal  Neck:   Supple, symmetrical, trachea midline, no adenopathy;       thyroid:  No enlargement/tenderness/nodules; no carotid   bruit or JVD  Back:     Symmetric, no curvature, ROM normal, no CVA tenderness  Lungs:    ,  respirations unlabored, mild bilateral inspiratory wheezing   Chest wall:    No tenderness or deformity  Heart:    Regular rate and rhythm, S1 and S2 normal, no murmur, rub   or gallop  Abdomen:     Soft, non-tender, bowel sounds active all four quadrants,    no masses, no organomegaly        Extremities:   Extremities normal, atraumatic, no cyanosis or edema  Pulses:   2+ and symmetric all extremities  Skin:   Skin color, texture, turgor normal, no rashes or lesions  Lymph nodes:   Cervical, supraclavicular, and axillary nodes normal  Neurologic:    Normal strength, sensation and reflexes      throughout     ECOG = 1    1 - Symptomatic but completely ambulatory (Restricted in physically strenuous activity but ambulatory and able to carry out work of a light or sedentary nature. For example, light  housework, office work)  LABORATORY DATA:  Lab Results  Component Value Date   WBC 13.8* 12/12/2014   HGB 11.1* 12/12/2014   HCT 36.4* 12/12/2014   MCV 80.5 12/12/2014   PLT 199 12/12/2014   NEUTROABS 12.7* 12/12/2014   Lab Results  Component Value Date   NA 139 12/12/2014   K 4.1 12/12/2014   CL 108 11/29/2014   CO2 27 12/12/2014   GLUCOSE 153* 12/12/2014   CREATININE 1.3 12/12/2014   CALCIUM 8.7 12/12/2014      RADIOGRAPHY: Ct Chest W Contrast  11/26/2014   CLINICAL DATA:  Abnormal chest radiograph with question RIGHT hilar mass versus adenopathy and RIGHT lung infiltrates  EXAM: CT CHEST WITH CONTRAST  TECHNIQUE: Multidetector CT imaging of the chest was performed during intravenous contrast administration. Sagittal and coronal MPR images reconstructed from axial data set.  CONTRAST:  73mL OMNIPAQUE IOHEXOL 300 MG/ML  SOLN IV  COMPARISON:  Chest radiograph 11/25/2014, CT chest 04/06/2011  FINDINGS: Atherosclerotic calcifications aorta and coronary arteries.  Aneurysmal dilatation ascending thoracic aorta 4.2 x 4.3 cm image 31.  Calcified subcarinal lymph node.  Additional minimally enlarged noncalcified subcarinal lymph node 11 mm short axis image 29.  Normal sized precarinal and prevascular lymph nodes.  Nonobstructing RIGHT renal calculi.  Small cyst upper pole LEFT kidney 2.4 x 2.3 cm image 63.  Post cholecystectomy.  Large RIGHT suprahilar mass invading the RIGHT hilum, 6.5 x 4.8 x 6.4 cm.  Mass occludes the RIGHT upper lobe bronchus.  Suspected confluent RIGHT hilar lymph nodes.  Extensive consolidation identified in the anterior segment of the RIGHT upper lobe with less consolidation seen in the RIGHT middle lobe, may represent postobstructive pneumonitis though lymphangitic tumor spread not completely excluded.  Stellate nodular density RIGHT upper lobe 8 mm diameter image 25 unchanged.  5 mm RIGHT lower lobe nodule image 48 unchanged.  Additional RIGHT lower lobe nodule 4 mm  diameter image 53 unchanged.  7 mm LEFT lower lobe nodule image 43 probably unchanged 6 mm RIGHT lower lobe nodule image 38 stability uncertain.  Small RIGHT pleural effusion.  Underlying severe emphysematous changes greatest in upper lobes.  No definite osseous metastatic lesions.  IMPRESSION: Large medial RIGHT upper lobe suprahilar mass 6.5 x 4.8 6.4 cm invading the RIGHT hilum compatible prior brain neoplasm.  Probable confluent RIGHT hilar adenopathy.  Pulmonary nodules bilaterally, some of which are stable, additional which are of uncertain stability due to presence of infiltrates on previous exam.  Postobstructive pneumonitis involving the anterior segment RIGHT upper lobe and  medial segment of RIGHT middle lobe, though lymphangitic tumor spread not excluded.   Electronically Signed   By: Lavonia Dana M.D.   On: 11/26/2014 11:21   Dg Chest Port 1 View  11/26/2014   CLINICAL DATA:  Subsequent evaluation right upper lobe mass and right upper lobe consolidation  EXAM: PORTABLE CHEST - 1 VIEW  COMPARISON:  11/26/2014, 11/25/2014  FINDINGS: Stable cardiac enlargement. Stable consolidation right upper lobe with right hilar fullness. Stable extensive right middle lobe infiltrate. Left lung appears clear.  IMPRESSION: No change from prior chest radiograph with known right lung mass, right upper lobe consolidation, and right middle lobe infiltrate.   Electronically Signed   By: Skipper Cliche M.D.   On: 11/26/2014 18:23   Dg Chest Port 1 View  11/25/2014   CLINICAL DATA:  Cough, shortness of breath and RIGHT-sided chest pain since 0300 hours today, history hypertension, COPD, coronary artery disease, atrial fibrillation, former smoker  EXAM: PORTABLE CHEST - 1 VIEW  COMPARISON:  04/05/2011; correlation CT chest 04/06/2011  FINDINGS: Normal heart size and pulmonary vascularity.  Enlargement of RIGHT hilum with questionable adenopathy versus suprahilar/perihilar mass.  Infiltrate identified in the RIGHT perihilar  region and RIGHT lower lobe, unable to exclude underlying mass lesion.  Remaining lungs emphysematous but clear.  No pleural effusion or pneumothorax.  IMPRESSION: COPD changes with RIGHT perihilar and lower lobe infiltrate infiltrate with RIGHT hilar enlargement suspicious for adenopathy or tumor.  Further evaluation by CT chest with contrast recommended to assess for potential malignancy.   Electronically Signed   By: Lavonia Dana M.D.   On: 11/25/2014 13:26      IMPRESSION: Locally advanced non-small cell lung cancer. The patient would be a good candidate for radiation therapy directed at the chest region along with radiosensitizing chemotherapy. Patient is scheduled for simulation on March 23 at 11 AM. Prior to this simulation the patient will complete his staging workup with PET scan and MRI the brain. If the patient has metastatic disease then he will receive a limited course of radiation therapy directed at the chest area. If staging workup does not reveal distant metastasis then he will proceed for approximately 6 weeks with chest radiation therapy along with radiosensitizing chemotherapy. I discussed the treatment course side effects and potential toxicities of radiation therapy in this situation with the patient and his granddaughter. He appears to understand and wishes to proceed with planned course of treatment.  PLAN: Simulation and planning on March 23 with treatments to begin last week in March.  I spent 60 minutes minutes face to face with the patient and more than 50% of that time was spent in counseling and/or coordination of care.   ------------------------------------------------  Blair Promise, PhD, MD

## 2014-12-13 NOTE — Telephone Encounter (Signed)
returned call and r/s MD visit due to PET on same day....pt ok adn aware of new d.t

## 2014-12-20 ENCOUNTER — Ambulatory Visit (HOSPITAL_COMMUNITY)
Admission: RE | Admit: 2014-12-20 | Discharge: 2014-12-20 | Disposition: A | Discharge status: Home / Self Care | Payer: Medicare Other | Source: Ambulatory Visit | Attending: Internal Medicine | Admitting: Internal Medicine

## 2014-12-20 DIAGNOSIS — C3490 Malignant neoplasm of unspecified part of unspecified bronchus or lung: Secondary | ICD-10-CM | POA: Diagnosis not present

## 2014-12-20 DIAGNOSIS — C3491 Malignant neoplasm of unspecified part of right bronchus or lung: Secondary | ICD-10-CM

## 2014-12-20 MED ORDER — GADOBENATE DIMEGLUMINE 529 MG/ML IV SOLN
20.0000 mL | Freq: Once | INTRAVENOUS | Status: AC | PRN
  Administered 2014-12-20: 18 mL via INTRAVENOUS

## 2014-12-22 ENCOUNTER — Encounter (HOSPITAL_COMMUNITY)
Admission: RE | Admit: 2014-12-22 | Discharge: 2014-12-22 | Disposition: A | Discharge status: Home / Self Care | Payer: Medicare Other | Source: Ambulatory Visit | Attending: Internal Medicine | Admitting: Internal Medicine

## 2014-12-22 ENCOUNTER — Ambulatory Visit: Payer: Medicare Other | Admitting: Internal Medicine

## 2014-12-22 DIAGNOSIS — C3491 Malignant neoplasm of unspecified part of right bronchus or lung: Secondary | ICD-10-CM | POA: Diagnosis present

## 2014-12-22 LAB — GLUCOSE, CAPILLARY: GLUCOSE-CAPILLARY: 84 mg/dL (ref 70–99)

## 2014-12-22 MED ORDER — FLUDEOXYGLUCOSE F - 18 (FDG) INJECTION
10.2800 | Freq: Once | INTRAVENOUS | Status: AC | PRN
  Administered 2014-12-22: 10.28 via INTRAVENOUS

## 2014-12-27 ENCOUNTER — Ambulatory Visit
Admission: RE | Admit: 2014-12-27 | Discharge: 2014-12-27 | Disposition: A | Discharge status: Home / Self Care | Payer: Medicare Other | Source: Ambulatory Visit | Attending: Radiation Oncology | Admitting: Radiation Oncology

## 2014-12-27 DIAGNOSIS — Z510 Encounter for antineoplastic radiation therapy: Secondary | ICD-10-CM | POA: Diagnosis not present

## 2014-12-27 DIAGNOSIS — C3491 Malignant neoplasm of unspecified part of right bronchus or lung: Secondary | ICD-10-CM

## 2014-12-28 ENCOUNTER — Telehealth: Payer: Self-pay | Admitting: *Deleted

## 2014-12-28 NOTE — Telephone Encounter (Signed)
Called to follow up with patient.  He stated he is feeling better after receiving "a shot of prednisone".  I listened as he spoke about is bronchitis.  It was good to hear he was feeling better.  I updated him on appt and he stated he was aware.

## 2014-12-29 NOTE — Progress Notes (Signed)
  Radiation Oncology         [phone] ________________________________  Name: [name] MRN: [id number]  Date: 12/27/2014  DOB: 01-Sep-1933  SIMULATION AND TREATMENT PLANNING NOTE    ICD-9-CM ICD-10-CM   1. SCC (squamous cell carcinoma of lung), right 162.9 C34.91     DIAGNOSIS: Stage IIIA (T3, N1, M0) non-small cell lung cancer, squamous cell carcinoma, presenting with a large right upper lobe lung mass with hilar lymphadenopathy diagnosed in February 2016.   NARRATIVE:  The patient was brought to the [place].  Identity was confirmed.  All relevant records and images related to the planned course of therapy were reviewed.  The patient freely provided informed written consent to proceed with treatment after reviewing the details related to the planned course of therapy. The consent form was witnessed and verified by the simulation staff.  Then, the patient was set-up in a stable reproducible  supine position for radiation therapy.  CT images were obtained.  Surface markings were placed.  The CT images were loaded into the planning software.  Then the target and avoidance structures were contoured.  Treatment planning then occurred.  The radiation prescription was entered and confirmed.  Then, I designed and supervised the construction of a total of 0 medically necessary complex treatment devices.  I have requested : 3D Simulation  I have requested a DVH of the following structures: GTV, PTV, lungs, heart, esophagus.  I have ordered:dose calc.  PLAN:  The patient will receive 60 Gy in 30 fractions.  ________________________________ Special treatment Procedure:  Radiosenz. Chemo, additional toxicities and monitoring necessary -----------------------------------  Blair Promise, PhD, MD

## 2015-01-02 DIAGNOSIS — Z510 Encounter for antineoplastic radiation therapy: Secondary | ICD-10-CM | POA: Diagnosis not present

## 2015-01-03 ENCOUNTER — Ambulatory Visit
Admission: RE | Admit: 2015-01-03 | Discharge: 2015-01-03 | Disposition: A | Discharge status: Home / Self Care | Payer: Medicare Other | Source: Ambulatory Visit | Attending: Radiation Oncology | Admitting: Radiation Oncology

## 2015-01-03 DIAGNOSIS — C3491 Malignant neoplasm of unspecified part of right bronchus or lung: Secondary | ICD-10-CM

## 2015-01-03 DIAGNOSIS — Z510 Encounter for antineoplastic radiation therapy: Secondary | ICD-10-CM | POA: Diagnosis not present

## 2015-01-04 ENCOUNTER — Ambulatory Visit (HOSPITAL_BASED_OUTPATIENT_CLINIC_OR_DEPARTMENT_OTHER): Payer: Medicare Other | Admitting: Internal Medicine

## 2015-01-04 ENCOUNTER — Ambulatory Visit
Admission: RE | Admit: 2015-01-04 | Discharge: 2015-01-04 | Disposition: A | Discharge status: Home / Self Care | Payer: Medicare Other | Source: Ambulatory Visit | Attending: Radiation Oncology | Admitting: Radiation Oncology

## 2015-01-04 ENCOUNTER — Other Ambulatory Visit: Payer: Self-pay | Admitting: Medical Oncology

## 2015-01-04 ENCOUNTER — Encounter: Payer: Self-pay | Admitting: Internal Medicine

## 2015-01-04 VITALS — BP 93/50 | HR 89 | Temp 97.8°F | Resp 18 | Ht 70.0 in | Wt 194.9 lb

## 2015-01-04 DIAGNOSIS — J438 Other emphysema: Secondary | ICD-10-CM

## 2015-01-04 DIAGNOSIS — C3491 Malignant neoplasm of unspecified part of right bronchus or lung: Secondary | ICD-10-CM

## 2015-01-04 DIAGNOSIS — Z510 Encounter for antineoplastic radiation therapy: Secondary | ICD-10-CM | POA: Diagnosis not present

## 2015-01-04 MED ORDER — PROCHLORPERAZINE MALEATE 10 MG PO TABS: 10.0000 mg | ORAL_TABLET | Freq: Four times a day (QID) | ORAL | Status: DC | PRN

## 2015-01-04 NOTE — Progress Notes (Signed)
[place] [phone]   [phone]  OFFICE PROGRESS NOTE  BURNETT,BRENT A, MD [address] [place] [address]  DIAGNOSIS: Stage IIIA (T3, N1, M0) non-small cell lung cancer, squamous cell carcinoma with positive PDL 1 expression, presented with large right upper lobe lung mass with hilar lymphadenopathy diagnosed in February 2016.  PRIOR THERAPY: None  CURRENT THERAPY: Concurrent chemoradiation with weekly carboplatin for AUC of 2 and paclitaxel 45 MG/M2. First dose 01/09/2015.  INTERVAL HISTORY: [name] 81 y.o. male returns to the clinic today for follow-up visit accompanied by his daughter and granddaughter. The patient is feeling fine today was no specific complaints except for shortness breath with exertion. He denied having any significant chest pain but has mild cough with no hemoptysis. The patient denied having any significant weight loss or night sweats. He has no fever or chills, no nausea or vomiting. He had a recent MRI of the brain as well as PET scan and he is here for evaluation and discussion of his treatment options based on the recent staging workup.   MEDICAL HISTORY: Past Medical History  Diagnosis Date  . Syncope   . COPD (chronic obstructive pulmonary disease)   . Chronic atrial fibrillation   . GERD (gastroesophageal reflux disease)   . Coronary artery disease   . Hypertension   . Kidney stone   . CAD (coronary artery disease), native coronary artery 12/03/2014  . Thoracic ascending aortic aneurysm 12/03/2014    4.2 x 4.3 cm noted on CT scan February 2016   . SCC (squamous cell carcinoma of lung)     ALLERGIES:  is allergic to codeine.  MEDICATIONS:  Current Outpatient Prescriptions  Medication Sig Dispense Refill  . albuterol (PROVENTIL HFA;VENTOLIN HFA) 108 (90 BASE) MCG/ACT inhaler Inhale 1-2 puffs into the lungs every 6 (six) hours as needed for wheezing or shortness of breath.    .  budesonide-formoterol (SYMBICORT) 160-4.5 MCG/ACT inhaler Inhale 2 puffs into the lungs 2 (two) times daily.    Marland Kitchen diltiazem (CARDIZEM) 90 MG tablet Take 90 mg by mouth 2 (two) times daily. Take two tablets in the morning and take one tablet in the afternoon.    . diphenhydramine-acetaminophen (TYLENOL PM) 25-500 MG TABS Take 2 tablets by mouth at bedtime as needed.    . furosemide (LASIX) 40 MG tablet Take 0.5 tablets (20 mg total) by mouth daily.    . pantoprazole (PROTONIX) 40 MG tablet Take 1 tablet (40 mg total) by mouth 2 (two) times daily. 60 tablet 1  . potassium chloride SA (K-DUR,KLOR-CON) 20 MEQ tablet Take 20 mEq by mouth daily.    . predniSONE (DELTASONE) 20 MG tablet Take 20 mg by mouth daily.  0  . rivaroxaban (XARELTO) 20 MG TABS tablet Take 20 mg by mouth daily with supper.    . zolpidem (AMBIEN) 10 MG tablet Take 5 mg by mouth at bedtime as needed for sleep.     No current facility-administered medications for this visit.    SURGICAL HISTORY:  Past Surgical History  Procedure Laterality Date  . Appendectomy    . Cholecystectomy    . Esophageal dilation  2012  . Video bronchoscopy Bilateral 11/29/2014    Procedure: VIDEO BRONCHOSCOPY WITHOUT FLUORO;  Surgeon: Collene Gobble, MD;  Location: [facility];  Service: Cardiopulmonary;  Laterality: Bilateral;  . Esophagogastroduodenoscopy N/A 11/30/2014    Procedure: ESOPHAGOGASTRODUODENOSCOPY (EGD);  Surgeon: Arta Silence, MD;  Location: [facility];  Service: Endoscopy;  Laterality: N/A;    REVIEW OF SYSTEMS:  Constitutional: positive for fatigue Eyes: negative Ears, nose, mouth, throat, and face: negative Respiratory: positive for cough and dyspnea on exertion Cardiovascular: negative Gastrointestinal: negative Genitourinary:negative Integument/breast: negative Hematologic/lymphatic: negative Musculoskeletal:negative Neurological: negative Behavioral/Psych: negative Endocrine: negative Allergic/Immunologic:  negative   PHYSICAL EXAMINATION: General appearance: alert, cooperative, fatigued and no distress Head: Normocephalic, without obvious abnormality, atraumatic Neck: no adenopathy, no JVD, supple, symmetrical, trachea midline and thyroid not enlarged, symmetric, no tenderness/mass/nodules Lymph nodes: Cervical, supraclavicular, and axillary nodes normal. Resp: clear to auscultation bilaterally Back: symmetric, no curvature. ROM normal. No CVA tenderness. Cardio: regular rate and rhythm, S1, S2 normal, no murmur, click, rub or gallop GI: soft, non-tender; bowel sounds normal; no masses,  no organomegaly Extremities: extremities normal, atraumatic, no cyanosis or edema Neurologic: Alert and oriented X 3, normal strength and tone. Normal symmetric reflexes. Normal coordination and gait  ECOG PERFORMANCE STATUS: 1 - Symptomatic but completely ambulatory  Blood pressure 93/50, pulse 89, temperature 97.8 F (36.6 C), resp. rate 18, height 5\' 10"  (1.778 m), weight 194 lb 14.4 oz (88.406 kg), SpO2 98 %.  LABORATORY DATA: Lab Results  Component Value Date   WBC 13.8* 12/12/2014   HGB 11.1* 12/12/2014   HCT 36.4* 12/12/2014   MCV 80.5 12/12/2014   PLT 199 12/12/2014      Chemistry      Component Value Date/Time   NA 139 12/12/2014 1124   NA 138 11/29/2014 0340   K 4.1 12/12/2014 1124   K 3.7 11/29/2014 0340   CL 108 11/29/2014 0340   CO2 27 12/12/2014 1124   CO2 23 11/29/2014 0340   BUN 25.5 12/12/2014 1124   BUN 12 11/29/2014 0340   CREATININE 1.3 12/12/2014 1124   CREATININE 1.02 11/29/2014 0340      Component Value Date/Time   CALCIUM 8.7 12/12/2014 1124   CALCIUM 8.6 11/29/2014 0340   ALKPHOS 91 12/12/2014 1124   ALKPHOS 73 04/09/2011 0419   AST 15 12/12/2014 1124   AST 42* 04/09/2011 0419   ALT 16 12/12/2014 1124   ALT 52 04/09/2011 0419   BILITOT 0.30 12/12/2014 1124   BILITOT 0.7 04/09/2011 0419       RADIOGRAPHIC STUDIES: Mr Jeri Cos Wo Contrast  09-Jan-2015    CLINICAL DATA:  81 year old male recently diagnosed with stage IIIA non-small cell lung cancer, squamous carcinoma. Staging. Subsequent encounter.  EXAM: MRI HEAD WITHOUT AND WITH CONTRAST  TECHNIQUE: Multiplanar, multiecho pulse sequences of the brain and surrounding structures were obtained without and with intravenous contrast.  CONTRAST:  38mL MULTIHANCE GADOBENATE DIMEGLUMINE 529 MG/ML IV SOLN  COMPARISON:  None.  FINDINGS: There is mild asymmetry of the pituitary gland (series 13, image 20) without definite pituitary mass or abnormal enhancement, and in part this may be related to cavernous sinus asymmetry secondary to abnormal left ICA (see below). No abnormal enhancement of the brain identified. Sulcal variations of the left occipital pole and along the left parieto-occipital sulcus. No midline shift, mass effect, or evidence of intracranial mass lesion. No dural thickening.  Cerebral volume is within normal limits for age. No restricted diffusion to suggest acute infarction. No midline ventriculomegaly, extra-axial collection or acute intracranial hemorrhage. Cervicomedullary junction and pituitary are within normal limits.  The left ICA flow void is absent in the upper neck and at the skullbase. Evidence of reconstituted flow at the left ICA terminus. Other Major intracranial vascular flow voids are  preserved.  Moderate for age patchy and scattered nonspecific cerebral white matter T2 and FLAIR hyperintensity, fairly symmetric. Small chronic lacunar infarct right cerebellum (series 7, image 7). Elsewhere no cortical encephalomalacia identified. There are occasional chronic micro hemorrhages in the cerebral hemispheres.  Visible internal auditory structures appear normal. Mastoids are clear. Mild paranasal sinus mucosal thickening. Visualized orbit soft tissues are within normal limits. Visualized scalp soft tissues are within normal limits. Normal bone marrow signal. Grossly normal visualized cervical  spinal cord.  IMPRESSION: 1.  No acute or metastatic intracranial abnormality. 2. 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 Evidence of reconstituted flow at the left ICA terminus. 3. Mild to moderate for age signal changes compatible with small vessel disease. Anatomic sulcal variation along the left parieto-occipital sulcus.   Electronically Signed   By: Genevie Ann M.D.   On: 12/21/2014 07:48   Nm Pet Image Initial (pi) Skull Base To Thigh  12/22/2014   CLINICAL DATA:  Initial Treatment strategy for squamous cell carcinoma of the right lung.  EXAM: NUCLEAR MEDICINE PET SKULL BASE TO THIGH  TECHNIQUE: 10.3 mCi F-18 FDG was injected intravenously. Full-ring PET imaging was performed from the skull base to thigh after the radiotracer. CT data was obtained and used for attenuation correction and anatomic localization.  FASTING BLOOD GLUCOSE:  Value: 84 mg/dl  COMPARISON:  CT scan from 11/26/2014.  FINDINGS: NECK  No hypermetabolic lymph nodes in the neck.  CHEST  18 x 9 mm peripheral right upper lobe lesion (image 63 series 4) is hypermetabolic with SUV max = 38.1.  The central right lung mass, adjacent to and extending into the right hilum is markedly hypermetabolic with SUV max = 01.7.  The confluent disease involving the antero medial right upper lobe is hypermetabolic with SUV max = 51.0  ABDOMEN/PELVIS  No abnormal hypermetabolic activity within the liver, pancreas, adrenal glands, or spleen. No hypermetabolic lymph nodes in the abdomen or pelvis.  Bilateral nonobstructing renal stones. Small cyst identified in the upper pole of the left kidney. Atherosclerosis of the abdominal aorta is evident. 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 Diverticular changes are seen in the left colon without diverticulitis.  SKELETON  No focal hypermetabolic activity to suggest skeletal metastasis.  IMPRESSION: Markedly  hypermetabolic disease in the anteromedial right upper lobe and and central right lung extending into the right hilum, consistent with known malignancy. A small hypermetabolic lesion in the periphery of the right lung near the apex is also hypermetabolic and may represent synchronous primary or metastatic disease.  No evidence for hypermetabolic metastases in the neck, abdomen, or pelvis.   Electronically Signed   By: Misty Stanley M.D.   On: 12/22/2014 16:08    ASSESSMENT AND PLAN: This is a very pleasant 81 years old white male recently diagnosed with a stage IIIa non-small cell lung cancer, squamous cell carcinoma. The recent MRI of the brain showed no evidence for metastatic disease to the brain and the PET scan showed no evidence for metastatic disease outside the chest. I had a lengthy discussion with the patient and his family today about his current disease status and treatment options. I recommended for the patient treatment with a course of concurrent chemoradiation with weekly carboplatin for AUC of 2 and paclitaxel 45 MG/M2 for a total of 6-7 weeks. I discussed with the patient adverse effect of the chemotherapy including but  not limited to alopecia, myelosuppression, nausea and vomiting, peripheral neuropathy, liver or renal dysfunction. I will arrange for the patient to have a chemotherapy education class before starting the first dose of his treatment. The patient is expected to start the first dose of his treatment next week. He would come back for follow-up visit in 2 weeks for reevaluation and management of any adverse effect of his treatment. I will call his pharmacy with prescription for Compazine 10 mg by mouth every 6 hours as needed for nausea. The patient was advised to call immediately if he has any concerning symptoms in the interval. The patient voices understanding of current disease status and treatment options and is in agreement with the current care plan.  All questions  were answered. The patient knows to call the clinic with any problems, questions or concerns. We can certainly see the patient much sooner if necessary.  I spent 15 minutes counseling the patient face to face. The total time spent in the appointment was 25 minutes.  Disclaimer: This note was dictated with voice recognition software. Similar sounding words can inadvertently be transcribed and may not be corrected upon review.

## 2015-01-05 ENCOUNTER — Ambulatory Visit: Admission: RE | Admit: 2015-01-05 | Payer: Medicare Other | Source: Ambulatory Visit

## 2015-01-05 ENCOUNTER — Telehealth: Payer: Self-pay | Admitting: *Deleted

## 2015-01-05 NOTE — Telephone Encounter (Signed)
Per staff message and POF I have scheduled appts. Advised scheduler of appts. JMW  

## 2015-01-08 ENCOUNTER — Ambulatory Visit
Admission: RE | Admit: 2015-01-08 | Discharge: 2015-01-08 | Disposition: A | Discharge status: Home / Self Care | Payer: Medicare Other | Source: Ambulatory Visit | Attending: Radiation Oncology | Admitting: Radiation Oncology

## 2015-01-08 DIAGNOSIS — Z510 Encounter for antineoplastic radiation therapy: Secondary | ICD-10-CM | POA: Diagnosis not present

## 2015-01-09 ENCOUNTER — Encounter: Payer: Self-pay | Admitting: *Deleted

## 2015-01-09 ENCOUNTER — Encounter: Payer: Self-pay | Admitting: Radiation Oncology

## 2015-01-09 ENCOUNTER — Ambulatory Visit
Admission: RE | Admit: 2015-01-09 | Discharge: 2015-01-09 | Disposition: A | Discharge status: Home / Self Care | Payer: Medicare Other | Source: Ambulatory Visit | Attending: Radiation Oncology | Admitting: Radiation Oncology

## 2015-01-09 ENCOUNTER — Other Ambulatory Visit: Payer: Medicare Other

## 2015-01-09 VITALS — BP 105/71 | HR 92 | Temp 98.0°F | Resp 20 | Ht 70.0 in | Wt 196.7 lb

## 2015-01-09 DIAGNOSIS — C3490 Malignant neoplasm of unspecified part of unspecified bronchus or lung: Secondary | ICD-10-CM | POA: Diagnosis present

## 2015-01-09 DIAGNOSIS — C3491 Malignant neoplasm of unspecified part of right bronchus or lung: Secondary | ICD-10-CM

## 2015-01-09 DIAGNOSIS — Z7901 Long term (current) use of anticoagulants: Secondary | ICD-10-CM | POA: Insufficient documentation

## 2015-01-09 DIAGNOSIS — R918 Other nonspecific abnormal finding of lung field: Secondary | ICD-10-CM | POA: Diagnosis not present

## 2015-01-09 DIAGNOSIS — Z510 Encounter for antineoplastic radiation therapy: Secondary | ICD-10-CM | POA: Diagnosis not present

## 2015-01-09 MED ORDER — RADIAPLEXRX EX GEL
Freq: Once | CUTANEOUS | Status: AC
  Administered 2015-01-09: 13:00:00 via TOPICAL

## 2015-01-09 NOTE — Progress Notes (Signed)
  Radiation Oncology         [phone] ________________________________  Name: [name] MRN: [id number]  Date: 01/09/2015  DOB: June 27, 1933  Weekly Radiation Therapy Management  DIAGNOSIS: Stage IIIA (T3, N1, M0) non-small cell lung cancer, squamous cell carcinoma, presenting with a large right upper lobe lung mass with hilar lymphadenopathy diagnosed in February 2016.   Current Dose: 6 Gy     Planned Dose:  60 Gy  Narrative . . . . . . . . The patient presents for routine under treatment assessment.                                   The patient is without complaint.                                 Set-up films were reviewed.                                 The chart was checked. Physical Findings. . .  height is 5\' 10"  (1.778 m) and weight is 196 lb 11.2 oz (89.223 kg). His oral temperature is 98 F (36.7 C). His blood pressure is 105/71 and his pulse is 92. His respiration is 20 and oxygen saturation is 99%. .  The lungs are clear. The heart appears to be in a regular rhythm at this point. Impression . . . . . . . The patient is tolerating radiation. Plan . . . . . . . . . . . . Continue treatment as planned.  ________________________________   Blair Promise, PhD, MD

## 2015-01-09 NOTE — Addendum Note (Signed)
Encounter addended by: Jacqulyn Liner, RN on: 01/09/2015  1:14 PM<BR>     Documentation filed: Medications, Dx Association, Notes Section, Orders

## 2015-01-09 NOTE — Progress Notes (Signed)
[name] has completed 3 fractions to his right chest.  He denies having any pain, a cough.  He reports shortness of breath with activity.  His oxygen saturation today was 99% on room air.  He will start chemotherapy tomorrow.  He denies having a sore throat but would like to get a prescription for carafate just in case.  BP 105/71 mmHg  Pulse 92  Temp(Src) 98 F (36.7 C) (Oral)  Resp 20  Ht 5\' 10"  (1.778 m)  Wt 196 lb 11.2 oz (89.223 kg)  BMI 28.22 kg/m2  SpO2 99%

## 2015-01-09 NOTE — Progress Notes (Signed)
Pt here for patient teaching.  Pt given Radiation and You booklet, skin care instructions and Radiaplex gel. Reviewed areas of pertinence such as fatigue, skin changes, throat changes and cough . Pt able to give teach back of to pat skin, use unscented/gentle soap and drink plenty of water,apply Radiaplex bid and avoid applying anything to skin within 4 hours of treatment. Pt demonstrated understanding and verbalizes understanding of information given and will contact nursing with any questions or concerns.

## 2015-01-09 NOTE — Addendum Note (Signed)
Encounter addended by: Jacqulyn Liner, RN on: 01/09/2015  1:16 PM<BR>     Documentation filed: Inpatient MAR

## 2015-01-10 ENCOUNTER — Other Ambulatory Visit: Payer: Self-pay | Admitting: Lab

## 2015-01-10 ENCOUNTER — Other Ambulatory Visit (HOSPITAL_BASED_OUTPATIENT_CLINIC_OR_DEPARTMENT_OTHER): Payer: Medicare Other

## 2015-01-10 ENCOUNTER — Ambulatory Visit (HOSPITAL_BASED_OUTPATIENT_CLINIC_OR_DEPARTMENT_OTHER): Payer: Medicare Other

## 2015-01-10 ENCOUNTER — Other Ambulatory Visit: Payer: Self-pay | Admitting: Internal Medicine

## 2015-01-10 ENCOUNTER — Other Ambulatory Visit: Payer: Self-pay | Admitting: Medical Oncology

## 2015-01-10 ENCOUNTER — Ambulatory Visit
Admission: RE | Admit: 2015-01-10 | Discharge: 2015-01-10 | Disposition: A | Discharge status: Home / Self Care | Payer: Medicare Other | Source: Ambulatory Visit | Attending: Radiation Oncology | Admitting: Radiation Oncology

## 2015-01-10 VITALS — BP 112/66 | HR 78 | Temp 98.1°F | Resp 20

## 2015-01-10 DIAGNOSIS — Z5111 Encounter for antineoplastic chemotherapy: Secondary | ICD-10-CM | POA: Diagnosis not present

## 2015-01-10 DIAGNOSIS — C3491 Malignant neoplasm of unspecified part of right bronchus or lung: Secondary | ICD-10-CM

## 2015-01-10 DIAGNOSIS — C3490 Malignant neoplasm of unspecified part of unspecified bronchus or lung: Secondary | ICD-10-CM

## 2015-01-10 DIAGNOSIS — Z510 Encounter for antineoplastic radiation therapy: Secondary | ICD-10-CM | POA: Diagnosis not present

## 2015-01-10 LAB — COMPREHENSIVE METABOLIC PANEL (CC13)
ALT: 16 U/L (ref 0–55)
AST: 13 U/L (ref 5–34)
Albumin: 2.5 g/dL — ABNORMAL LOW (ref 3.5–5.0)
Alkaline Phosphatase: 81 U/L (ref 40–150)
Anion Gap: 11 mEq/L (ref 3–11)
BUN: 25 mg/dL (ref 7.0–26.0)
CHLORIDE: 106 meq/L (ref 98–109)
CO2: 25 mEq/L (ref 22–29)
CREATININE: 1.1 mg/dL (ref 0.7–1.3)
Calcium: 8.7 mg/dL (ref 8.4–10.4)
EGFR: 66 mL/min/{1.73_m2} — ABNORMAL LOW (ref >90)
Glucose: 121 mg/dl (ref 70–140)
Potassium: 4 mEq/L (ref 3.5–5.1)
Sodium: 142 mEq/L (ref 136–145)
Total Bilirubin: 0.56 mg/dL (ref 0.20–1.20)
Total Protein: 6.1 g/dL — ABNORMAL LOW (ref 6.4–8.3)

## 2015-01-10 LAB — CBC WITH DIFFERENTIAL/PLATELET
BASO%: 0.1 % (ref 0.0–2.0)
Basophils Absolute: 0 10*3/uL (ref 0.0–0.1)
EOS%: 0.2 % (ref 0.0–7.0)
Eosinophils Absolute: 0 10*3/uL (ref 0.0–0.5)
HEMATOCRIT: 36.3 % — AB (ref 38.4–49.9)
HEMOGLOBIN: 11.3 g/dL — AB (ref 13.0–17.1)
LYMPH%: 5.6 % — AB (ref 14.0–49.0)
MCH: 25.9 pg — AB (ref 27.2–33.4)
MCHC: 31.1 g/dL — ABNORMAL LOW (ref 32.0–36.0)
MCV: 83.1 fL (ref 79.3–98.0)
MONO#: 1.1 10*3/uL — ABNORMAL HIGH (ref 0.1–0.9)
MONO%: 8.3 % (ref 0.0–14.0)
NEUT%: 85.8 % — AB (ref 39.0–75.0)
NEUTROS ABS: 11.7 10*3/uL — AB (ref 1.5–6.5)
NRBC: 0 % (ref 0–0)
Platelets: 183 10*3/uL (ref 140–400)
RBC: 4.37 10*6/uL (ref 4.20–5.82)
RDW: 17.5 % — ABNORMAL HIGH (ref 11.0–14.6)
WBC: 13.6 10*3/uL — ABNORMAL HIGH (ref 4.0–10.3)
lymph#: 0.8 10*3/uL — ABNORMAL LOW (ref 0.9–3.3)

## 2015-01-10 MED ORDER — SODIUM CHLORIDE 0.9 % IV SOLN
Freq: Once | INTRAVENOUS | Status: AC
Start: 2015-01-10 — End: 2015-01-10
  Administered 2015-01-10: 11:00:00 via INTRAVENOUS

## 2015-01-10 MED ORDER — DEXAMETHASONE SODIUM PHOSPHATE 100 MG/10ML IJ SOLN
Freq: Once | INTRAMUSCULAR | Status: AC
  Administered 2015-01-10: 12:00:00 via INTRAVENOUS
  Filled 2015-01-10: qty 8

## 2015-01-10 MED ORDER — PACLITAXEL CHEMO INJECTION 300 MG/50ML
45.0000 mg/m2 | Freq: Once | INTRAVENOUS | Status: AC
  Administered 2015-01-10: 96 mg via INTRAVENOUS
  Filled 2015-01-10: qty 16

## 2015-01-10 MED ORDER — FAMOTIDINE IN NACL 20-0.9 MG/50ML-% IV SOLN
20.0000 mg | Freq: Once | INTRAVENOUS | Status: AC
  Administered 2015-01-10: 20 mg via INTRAVENOUS

## 2015-01-10 MED ORDER — DIPHENHYDRAMINE HCL 50 MG/ML IJ SOLN
50.0000 mg | Freq: Once | INTRAMUSCULAR | Status: AC
  Administered 2015-01-10: 50 mg via INTRAVENOUS

## 2015-01-10 MED ORDER — DIPHENHYDRAMINE HCL 50 MG/ML IJ SOLN
INTRAMUSCULAR | Status: AC
Start: 2015-01-10 — End: 2015-01-10
  Filled 2015-01-10: qty 1

## 2015-01-10 MED ORDER — SODIUM CHLORIDE 0.9 % IV SOLN
186.2000 mg | Freq: Once | INTRAVENOUS | Status: AC
  Administered 2015-01-10: 190 mg via INTRAVENOUS
  Filled 2015-01-10: qty 19

## 2015-01-10 MED ORDER — FAMOTIDINE IN NACL 20-0.9 MG/50ML-% IV SOLN
INTRAVENOUS | Status: AC
  Filled 2015-01-10: qty 50

## 2015-01-10 NOTE — Patient Instructions (Signed)
[facility] Discharge Instructions for Patients Receiving Chemotherapy  Today you received the following chemotherapy agents Taxol and Carboplatin.  To help prevent nausea and vomiting after your treatment, we encourage you to take your nausea medication as prescribed.   If you develop nausea and vomiting that is not controlled by your nausea medication, call the clinic.   BELOW ARE SYMPTOMS THAT SHOULD BE REPORTED IMMEDIATELY:  *FEVER GREATER THAN 100.5 F  *CHILLS WITH OR WITHOUT FEVER  NAUSEA AND VOMITING THAT IS NOT CONTROLLED WITH YOUR NAUSEA MEDICATION  *UNUSUAL SHORTNESS OF BREATH  *UNUSUAL BRUISING OR BLEEDING  TENDERNESS IN MOUTH AND THROAT WITH OR WITHOUT PRESENCE OF ULCERS  *URINARY PROBLEMS  *BOWEL PROBLEMS  UNUSUAL RASH Items with * indicate a potential emergency and should be followed up as soon as possible.  Feel free to call the clinic you have any questions or concerns. The clinic phone number is [phone].  Please show the CHEMO ALERT CARD at check-in to the Emergency Department and triage nurse.   

## 2015-01-11 ENCOUNTER — Other Ambulatory Visit: Payer: Self-pay | Admitting: *Deleted

## 2015-01-11 ENCOUNTER — Ambulatory Visit
Admission: RE | Admit: 2015-01-11 | Discharge: 2015-01-11 | Disposition: A | Discharge status: Home / Self Care | Payer: Medicare Other | Source: Ambulatory Visit | Attending: Radiation Oncology | Admitting: Radiation Oncology

## 2015-01-11 DIAGNOSIS — C3490 Malignant neoplasm of unspecified part of unspecified bronchus or lung: Secondary | ICD-10-CM

## 2015-01-11 DIAGNOSIS — Z510 Encounter for antineoplastic radiation therapy: Secondary | ICD-10-CM | POA: Diagnosis not present

## 2015-01-12 ENCOUNTER — Ambulatory Visit
Admission: RE | Admit: 2015-01-12 | Discharge: 2015-01-12 | Disposition: A | Discharge status: Home / Self Care | Payer: Medicare Other | Source: Ambulatory Visit | Attending: Radiation Oncology | Admitting: Radiation Oncology

## 2015-01-12 ENCOUNTER — Telehealth: Payer: Self-pay | Admitting: *Deleted

## 2015-01-12 DIAGNOSIS — Z510 Encounter for antineoplastic radiation therapy: Secondary | ICD-10-CM | POA: Diagnosis not present

## 2015-01-12 NOTE — Telephone Encounter (Signed)
-----   Message from Rosalie Gums, RN sent at 01/10/2015  5:30 PM EDT ----- Regarding: Dr.Mohamed New Pt callback First time taxol and carboplatin on 12/10/14. Tolerated well.

## 2015-01-12 NOTE — Telephone Encounter (Signed)
Called pt to check status. Unable to reach pt lmovm. Pt instructed to call office with any questions or concerns.

## 2015-01-15 ENCOUNTER — Ambulatory Visit (HOSPITAL_BASED_OUTPATIENT_CLINIC_OR_DEPARTMENT_OTHER): Payer: Medicare Other | Admitting: Oncology

## 2015-01-15 ENCOUNTER — Ambulatory Visit (HOSPITAL_BASED_OUTPATIENT_CLINIC_OR_DEPARTMENT_OTHER): Payer: Medicare Other

## 2015-01-15 ENCOUNTER — Other Ambulatory Visit (HOSPITAL_BASED_OUTPATIENT_CLINIC_OR_DEPARTMENT_OTHER): Payer: Medicare Other

## 2015-01-15 ENCOUNTER — Ambulatory Visit
Admission: RE | Admit: 2015-01-15 | Discharge: 2015-01-15 | Disposition: A | Discharge status: Home / Self Care | Payer: Medicare Other | Source: Ambulatory Visit | Attending: Radiation Oncology | Admitting: Radiation Oncology

## 2015-01-15 ENCOUNTER — Encounter: Payer: Self-pay | Admitting: Oncology

## 2015-01-15 ENCOUNTER — Telehealth: Payer: Self-pay | Admitting: Internal Medicine

## 2015-01-15 VITALS — BP 92/55 | HR 67 | Temp 97.8°F | Resp 18 | Ht 70.0 in | Wt 193.9 lb

## 2015-01-15 DIAGNOSIS — J302 Other seasonal allergic rhinitis: Secondary | ICD-10-CM | POA: Diagnosis not present

## 2015-01-15 DIAGNOSIS — C3411 Malignant neoplasm of upper lobe, right bronchus or lung: Secondary | ICD-10-CM

## 2015-01-15 DIAGNOSIS — Z510 Encounter for antineoplastic radiation therapy: Secondary | ICD-10-CM | POA: Diagnosis not present

## 2015-01-15 DIAGNOSIS — Z5111 Encounter for antineoplastic chemotherapy: Secondary | ICD-10-CM

## 2015-01-15 DIAGNOSIS — C3490 Malignant neoplasm of unspecified part of unspecified bronchus or lung: Secondary | ICD-10-CM

## 2015-01-15 DIAGNOSIS — C3491 Malignant neoplasm of unspecified part of right bronchus or lung: Secondary | ICD-10-CM

## 2015-01-15 LAB — CBC WITH DIFFERENTIAL/PLATELET
BASO%: 0.2 % (ref 0.0–2.0)
BASOS ABS: 0 10*3/uL (ref 0.0–0.1)
EOS ABS: 0.1 10*3/uL (ref 0.0–0.5)
EOS%: 0.8 % (ref 0.0–7.0)
HCT: 34.9 % — ABNORMAL LOW (ref 38.4–49.9)
HEMOGLOBIN: 10.9 g/dL — AB (ref 13.0–17.1)
LYMPH#: 0.4 10*3/uL — AB (ref 0.9–3.3)
LYMPH%: 4.5 % — ABNORMAL LOW (ref 14.0–49.0)
MCH: 25.2 pg — ABNORMAL LOW (ref 27.2–33.4)
MCHC: 31.1 g/dL — ABNORMAL LOW (ref 32.0–36.0)
MCV: 80.9 fL (ref 79.3–98.0)
MONO#: 0.2 10*3/uL (ref 0.1–0.9)
MONO%: 2.2 % (ref 0.0–14.0)
NEUT%: 92.3 % — ABNORMAL HIGH (ref 39.0–75.0)
NEUTROS ABS: 8.8 10*3/uL — AB (ref 1.5–6.5)
Platelets: 159 10*3/uL (ref 140–400)
RBC: 4.32 10*6/uL (ref 4.20–5.82)
RDW: 17.9 % — ABNORMAL HIGH (ref 11.0–14.6)
WBC: 9.5 10*3/uL (ref 4.0–10.3)

## 2015-01-15 LAB — COMPREHENSIVE METABOLIC PANEL (CC13)
ALT: 16 U/L (ref 0–55)
ANION GAP: 9 meq/L (ref 3–11)
AST: 11 U/L (ref 5–34)
Albumin: 2.8 g/dL — ABNORMAL LOW (ref 3.5–5.0)
Alkaline Phosphatase: 77 U/L (ref 40–150)
BUN: 30.4 mg/dL — AB (ref 7.0–26.0)
CO2: 26 mEq/L (ref 22–29)
CREATININE: 1.2 mg/dL (ref 0.7–1.3)
Calcium: 8.7 mg/dL (ref 8.4–10.4)
Chloride: 106 mEq/L (ref 98–109)
EGFR: 59 mL/min/{1.73_m2} — AB (ref >90)
Glucose: 92 mg/dl (ref 70–140)
POTASSIUM: 3.9 meq/L (ref 3.5–5.1)
Sodium: 140 mEq/L (ref 136–145)
Total Bilirubin: 0.5 mg/dL (ref 0.20–1.20)
Total Protein: 6.4 g/dL (ref 6.4–8.3)

## 2015-01-15 MED ORDER — SODIUM CHLORIDE 0.9 % IV SOLN
Freq: Once | INTRAVENOUS | Status: AC
  Administered 2015-01-15: 13:00:00 via INTRAVENOUS

## 2015-01-15 MED ORDER — DIPHENHYDRAMINE HCL 50 MG/ML IJ SOLN
50.0000 mg | Freq: Once | INTRAMUSCULAR | Status: AC
  Administered 2015-01-15: 50 mg via INTRAVENOUS

## 2015-01-15 MED ORDER — DIPHENHYDRAMINE HCL 50 MG/ML IJ SOLN
INTRAMUSCULAR | Status: AC
  Filled 2015-01-15: qty 1

## 2015-01-15 MED ORDER — SODIUM CHLORIDE 0.9 % IV SOLN
186.2000 mg | Freq: Once | INTRAVENOUS | Status: AC
  Administered 2015-01-15: 190 mg via INTRAVENOUS
  Filled 2015-01-15: qty 19

## 2015-01-15 MED ORDER — FAMOTIDINE IN NACL 20-0.9 MG/50ML-% IV SOLN
INTRAVENOUS | Status: AC
  Filled 2015-01-15: qty 50

## 2015-01-15 MED ORDER — FAMOTIDINE IN NACL 20-0.9 MG/50ML-% IV SOLN
20.0000 mg | Freq: Once | INTRAVENOUS | Status: AC
  Administered 2015-01-15: 20 mg via INTRAVENOUS

## 2015-01-15 MED ORDER — SODIUM CHLORIDE 0.9 % IV SOLN
Freq: Once | INTRAVENOUS | Status: AC
  Administered 2015-01-15: 13:00:00 via INTRAVENOUS
  Filled 2015-01-15: qty 8

## 2015-01-15 MED ORDER — DEXTROSE 5 % IV SOLN
45.0000 mg/m2 | Freq: Once | INTRAVENOUS | Status: AC
  Administered 2015-01-15: 96 mg via INTRAVENOUS
  Filled 2015-01-15: qty 16

## 2015-01-15 NOTE — Progress Notes (Signed)
[place] [phone]   [phone]  OFFICE PROGRESS NOTE  BURNETT,BRENT A, MD [address] [place] [address]  DIAGNOSIS: Stage IIIA (T3, N1, M0) non-small cell lung cancer, squamous cell carcinoma with positive PDL 1 expression, presented with large right upper lobe lung mass with hilar lymphadenopathy diagnosed in February 2016.  PRIOR THERAPY: None  CURRENT THERAPY: Concurrent chemoradiation with weekly carboplatin for AUC of 2 and paclitaxel 45 MG/M2. First dose 01/10/2015.  INTERVAL HISTORY: [name] 81 y.o. male returns to the clinic today for follow-up visit accompanied by his granddaughter. The patient tolerated his first dose of chemo well. Denies significant chest pain and shortness of breath at rest. Reports an increase in fatigue, but this has not affected his ADLs. He notices an increase dyspnea on exertion since yesterday. He discontinue prednisone 20 mg daily sometime late last week. He also notices increased runny nose and itchy eyes. Denies cough, fever, and chills. The patient denied having any significant weight loss or night sweats. He has no nausea or vomiting.   MEDICAL HISTORY: Past Medical History  Diagnosis Date  . Syncope   . COPD (chronic obstructive pulmonary disease)   . Chronic atrial fibrillation   . GERD (gastroesophageal reflux disease)   . Coronary artery disease   . Hypertension   . Kidney stone   . CAD (coronary artery disease), native coronary artery 12/03/2014  . Thoracic ascending aortic aneurysm 12/03/2014    4.2 x 4.3 cm noted on CT scan February 2016   . SCC (squamous cell carcinoma of lung)     ALLERGIES:  is allergic to codeine.  MEDICATIONS:  Current Outpatient Prescriptions  Medication Sig Dispense Refill  . albuterol (PROVENTIL HFA;VENTOLIN HFA) 108 (90 BASE) MCG/ACT inhaler Inhale 1-2 puffs into the lungs every 6 (six) hours as needed for wheezing or shortness of  breath.    . budesonide-formoterol (SYMBICORT) 160-4.5 MCG/ACT inhaler Inhale 2 puffs into the lungs 2 (two) times daily.    Marland Kitchen diltiazem (CARDIZEM) 90 MG tablet Take 90 mg by mouth 2 (two) times daily. Take two tablets in the morning and take one tablet in the afternoon.    . diphenhydramine-acetaminophen (TYLENOL PM) 25-500 MG TABS Take 2 tablets by mouth at bedtime as needed.    . furosemide (LASIX) 40 MG tablet Take 0.5 tablets (20 mg total) by mouth daily.    . hyaluronate sodium (RADIAPLEXRX) GEL Apply 1 application topically once.    . pantoprazole (PROTONIX) 40 MG tablet Take 1 tablet (40 mg total) by mouth 2 (two) times daily. 60 tablet 1  . potassium chloride SA (K-DUR,KLOR-CON) 20 MEQ tablet Take 20 mEq by mouth daily.    . prochlorperazine (COMPAZINE) 10 MG tablet Take 1 tablet (10 mg total) by mouth every 6 (six) hours as needed for nausea or vomiting. 30 tablet 0  . rivaroxaban (XARELTO) 20 MG TABS tablet Take 20 mg by mouth daily with supper.    . zolpidem (AMBIEN) 10 MG tablet Take 5 mg by mouth at bedtime as needed for sleep.     No current facility-administered medications for this visit.   Facility-Administered Medications Ordered in Other Visits  Medication Dose Route Frequency Provider Last Rate Last Dose  . 0.9 %  sodium chloride infusion   Intravenous Once Curt Bears, MD      . CARBOplatin (PARAPLATIN) 190 mg in sodium chloride 0.9 % 100 mL chemo infusion  190 mg Intravenous Once Curt Bears, MD      . diphenhydrAMINE (BENADRYL) injection 50 mg  50 mg Intravenous Once Curt Bears, MD      . famotidine (PEPCID) IVPB 20 mg  20 mg Intravenous Once Curt Bears, MD      . ondansetron (ZOFRAN) 16 mg, dexamethasone (DECADRON) 20 mg in sodium chloride 0.9 % 50 mL IVPB   Intravenous Once Curt Bears, MD      . PACLitaxel (TAXOL) 96 mg in dextrose 5 % 250 mL chemo infusion (</= 80mg /m2)  45 mg/m2 (Treatment Plan Actual) Intravenous Once Curt Bears, MD         SURGICAL HISTORY:  Past Surgical History  Procedure Laterality Date  . Appendectomy    . Cholecystectomy    . Esophageal dilation  2012  . Video bronchoscopy Bilateral 11/29/2014    Procedure: VIDEO BRONCHOSCOPY WITHOUT FLUORO;  Surgeon: Collene Gobble, MD;  Location: [facility];  Service: Cardiopulmonary;  Laterality: Bilateral;  . Esophagogastroduodenoscopy N/A 11/30/2014    Procedure: ESOPHAGOGASTRODUODENOSCOPY (EGD);  Surgeon: Arta Silence, MD;  Location: [facility] ENDOSCOPY;  Service: Endoscopy;  Laterality: N/A;    REVIEW OF SYSTEMS:  Constitutional: positive for fatigue Eyes: negative Ears, nose, mouth, throat, and face: negative Respiratory: positive for cough and dyspnea on exertion Cardiovascular: negative Gastrointestinal: negative Genitourinary:negative Integument/breast: negative Hematologic/lymphatic: negative Musculoskeletal:negative Neurological: negative Behavioral/Psych: negative Endocrine: negative Allergic/Immunologic: negative   PHYSICAL EXAMINATION: General appearance: alert, cooperative, fatigued and no distress Head: Normocephalic, without obvious abnormality, atraumatic Neck: no adenopathy, no JVD, supple, symmetrical, trachea midline and thyroid not enlarged, symmetric, no tenderness/mass/nodules Lymph nodes: Cervical, supraclavicular, and axillary nodes normal. Resp: clear to auscultation bilaterally Back: symmetric, no curvature. ROM normal. No CVA tenderness. Cardio: regular rate and rhythm, S1, S2 normal, no murmur, click, rub or gallop GI: soft, non-tender; bowel sounds normal; no masses,  no organomegaly Extremities: extremities normal, atraumatic, no cyanosis or edema Neurologic: Alert and oriented X 3, normal strength and tone. Normal symmetric reflexes. Normal coordination and gait  ECOG PERFORMANCE STATUS: 1 - Symptomatic but completely ambulatory  Blood pressure 92/55, pulse 67, temperature 97.8 F (36.6 C), temperature source Oral, resp.  rate 18, height 5\' 10"  (1.778 m), weight 193 lb 14.4 oz (87.952 kg), SpO2 100 %.  LABORATORY DATA: Lab Results  Component Value Date   WBC 9.5 01/15/2015   HGB 10.9* 01/15/2015   HCT 34.9* 01/15/2015   MCV 80.9 01/15/2015   PLT 159 01/15/2015      Chemistry      Component Value Date/Time   NA 140 01/15/2015 1125   NA 138 11/29/2014 0340   K 3.9 01/15/2015 1125   K 3.7 11/29/2014 0340   CL 108 11/29/2014 0340   CO2 26 01/15/2015 1125   CO2 23 11/29/2014 0340   BUN 30.4* 01/15/2015 1125   BUN 12 11/29/2014 0340   CREATININE 1.2 01/15/2015 1125   CREATININE 1.02 11/29/2014 0340      Component Value Date/Time   CALCIUM 8.7 01/15/2015 1125   CALCIUM 8.6 11/29/2014 0340   ALKPHOS 77 01/15/2015 1125   ALKPHOS 73 04/09/2011 0419   AST 11 01/15/2015 1125   AST 42* 04/09/2011 0419   ALT 16 01/15/2015 1125   ALT 52 04/09/2011 0419   BILITOT 0.50 01/15/2015 1125   BILITOT 0.7 04/09/2011 0419       RADIOGRAPHIC STUDIES: Mr Jeri Cos Wo Contrast  01-19-15   CLINICAL DATA:  81 year old male recently diagnosed with stage IIIA non-small  cell lung cancer, squamous carcinoma. Staging. Subsequent encounter.  EXAM: MRI HEAD WITHOUT AND WITH CONTRAST  TECHNIQUE: Multiplanar, multiecho pulse sequences of the brain and surrounding structures were obtained without and with intravenous contrast.  CONTRAST:  73mL MULTIHANCE GADOBENATE DIMEGLUMINE 529 MG/ML IV SOLN  COMPARISON:  None.  FINDINGS: There is mild asymmetry of the pituitary gland (series 13, image 20) without definite pituitary mass or abnormal enhancement, and in part this may be related to cavernous sinus asymmetry secondary to abnormal left ICA (see below). No abnormal enhancement of the brain identified. Sulcal variations of the left occipital pole and along the left parieto-occipital sulcus. No midline shift, mass effect, or evidence of intracranial mass lesion. No dural thickening.  Cerebral volume is within normal limits for age.  No restricted diffusion to suggest acute infarction. No midline ventriculomegaly, extra-axial collection or acute intracranial hemorrhage. Cervicomedullary junction and pituitary are within normal limits.  The left ICA flow void is absent in the upper neck and at the skullbase. Evidence of reconstituted flow at the left ICA terminus. Other Major intracranial vascular flow voids are preserved.  Moderate for age patchy and scattered nonspecific cerebral white matter T2 and FLAIR hyperintensity, fairly symmetric. Small chronic lacunar infarct right cerebellum (series 7, image 7). Elsewhere no cortical encephalomalacia identified. There are occasional chronic micro hemorrhages in the cerebral hemispheres.  Visible internal auditory structures appear normal. Mastoids are clear. Mild paranasal sinus mucosal thickening. Visualized orbit soft tissues are within normal limits. Visualized scalp soft tissues are within normal limits. Normal bone marrow signal. Grossly normal visualized cervical spinal cord.  IMPRESSION: 1.  No acute or metastatic intracranial abnormality. 2. 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 Evidence of reconstituted flow at the left ICA terminus. 3. Mild to moderate for age signal changes compatible with small vessel disease. Anatomic sulcal variation along the left parieto-occipital sulcus.   Electronically Signed   By: Genevie Ann M.D.   On: 12/21/2014 07:48   Nm Pet Image Initial (pi) Skull Base To Thigh  12/22/2014   CLINICAL DATA:  Initial Treatment strategy for squamous cell carcinoma of the right lung.  EXAM: NUCLEAR MEDICINE PET SKULL BASE TO THIGH  TECHNIQUE: 10.3 mCi F-18 FDG was injected intravenously. Full-ring PET imaging was performed from the skull base to thigh after the radiotracer. CT data was obtained and used for attenuation correction and anatomic localization.  FASTING BLOOD GLUCOSE:  Value: 84 mg/dl  COMPARISON:  CT scan from 11/26/2014.   FINDINGS: NECK  No hypermetabolic lymph nodes in the neck.  CHEST  18 x 9 mm peripheral right upper lobe lesion (image 63 series 4) is hypermetabolic with SUV max = 40.9.  The central right lung mass, adjacent to and extending into the right hilum is markedly hypermetabolic with SUV max = 81.1.  The confluent disease involving the antero medial right upper lobe is hypermetabolic with SUV max = 91.4  ABDOMEN/PELVIS  No abnormal hypermetabolic activity within the liver, pancreas, adrenal glands, or spleen. No hypermetabolic lymph nodes in the abdomen or pelvis.  Bilateral nonobstructing renal stones. Small cyst identified in the upper pole of the left kidney. Atherosclerosis of the abdominal aorta is evident. 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 Diverticular changes are seen in the left colon without diverticulitis.  SKELETON  No focal hypermetabolic activity to suggest skeletal metastasis.  IMPRESSION: Markedly hypermetabolic disease in the anteromedial  right upper lobe and and central right lung extending into the right hilum, consistent with known malignancy. A small hypermetabolic lesion in the periphery of the right lung near the apex is also hypermetabolic and may represent synchronous primary or metastatic disease.  No evidence for hypermetabolic metastases in the neck, abdomen, or pelvis.   Electronically Signed   By: Misty Stanley M.D.   On: 12/22/2014 16:08    ASSESSMENT AND PLAN: This is a very pleasant 81 year old white male recently diagnosed with a stage IIIa non-small cell lung cancer, squamous cell carcinoma. The recent MRI of the brain showed no evidence for metastatic disease to the brain and the PET scan showed no evidence for metastatic disease outside the chest. He is currently receiving concurrent chemoradiation with weekly carboplatin for AUC of 2 and paclitaxel 45 MG/M2 for a total of 6-7 weeks.  The patient was seen  and examined with Dr. Julien Nordmann. He is tolerating his chemotherapy well so far. Recommend that he proceed with cycle 2 of his carboplatin and Taxol today. He will continue to monitor his dyspnea. Of note, his O2 sat is 100% on room air. Recommend that he use Claritin for his seasonal allergy symptoms.  He will return in one week for labs and chemotherapy. He will come back for follow-up visit in 2 weeks for reevaluation and management of any adverse effect of his treatment. The patient was advised to call immediately if he has any concerning symptoms in the interval. The patient voices understanding of current disease status and treatment options and is in agreement with the current care plan.  All questions were answered. The patient knows to call the clinic with any problems, questions or concerns. We can certainly see the patient much sooner if necessary.  Mikey Bussing, DNP, AGPCNP-BC, AOCNP    ADDENDUM: Hematology/Oncology Attending: I had a face to face encounter with the patient today. I recommended his care plan. This is a very pleasant 81 years old white male recently diagnosed with a stage IIIa non-small cell lung cancer and currently undergoing a course of concurrent chemoradiation with weekly carboplatin and paclitaxel. He tolerated the first week of his treatment fairly well with no significant adverse effects. The patient has some seasonal allergy and I recommended for him to use Claritin as needed. He will proceed with week #2 of his treatment today as a scheduled. The patient would come back for follow-up visit in 2 weeks for reevaluation and management of any adverse effect of his treatment. He was advised to call immediately if he has any concerning symptoms in the interval.  Disclaimer: This note was dictated with voice recognition software. Similar sounding words can inadvertently be transcribed and may be missed upon review. Eilleen Kempf., MD 01/15/2015

## 2015-01-15 NOTE — Telephone Encounter (Signed)
gave and printed appt sched and avs fo rpt for April and May....Marland Kitchensed added tx.

## 2015-01-15 NOTE — Patient Instructions (Signed)
[facility] Discharge Instructions for Patients Receiving Chemotherapy  Today you received the following chemotherapy agents Taxol and Carboplatin.  To help prevent nausea and vomiting after your treatment, we encourage you to take your nausea medication as prescribed.   If you develop nausea and vomiting that is not controlled by your nausea medication, call the clinic.   BELOW ARE SYMPTOMS THAT SHOULD BE REPORTED IMMEDIATELY:  *FEVER GREATER THAN 100.5 F  *CHILLS WITH OR WITHOUT FEVER  NAUSEA AND VOMITING THAT IS NOT CONTROLLED WITH YOUR NAUSEA MEDICATION  *UNUSUAL SHORTNESS OF BREATH  *UNUSUAL BRUISING OR BLEEDING  TENDERNESS IN MOUTH AND THROAT WITH OR WITHOUT PRESENCE OF ULCERS  *URINARY PROBLEMS  *BOWEL PROBLEMS  UNUSUAL RASH Items with * indicate a potential emergency and should be followed up as soon as possible.  Feel free to call the clinic you have any questions or concerns. The clinic phone number is [phone].  Please show the CHEMO ALERT CARD at check-in to the Emergency Department and triage nurse.   

## 2015-01-16 ENCOUNTER — Ambulatory Visit
Admission: RE | Admit: 2015-01-16 | Discharge: 2015-01-16 | Disposition: A | Discharge status: Home / Self Care | Payer: Medicare Other | Source: Ambulatory Visit | Attending: Radiation Oncology | Admitting: Radiation Oncology

## 2015-01-16 ENCOUNTER — Encounter: Payer: Self-pay | Admitting: Radiation Oncology

## 2015-01-16 VITALS — BP 112/65 | HR 74 | Temp 97.7°F | Resp 20 | Ht 70.0 in | Wt 197.0 lb

## 2015-01-16 DIAGNOSIS — C3490 Malignant neoplasm of unspecified part of unspecified bronchus or lung: Secondary | ICD-10-CM

## 2015-01-16 DIAGNOSIS — Z510 Encounter for antineoplastic radiation therapy: Secondary | ICD-10-CM | POA: Diagnosis not present

## 2015-01-16 MED ORDER — SUCRALFATE 1 GM/10ML PO SUSP: 1.0000 g | Freq: Three times a day (TID) | ORAL | Status: DC

## 2015-01-16 NOTE — Progress Notes (Signed)
  Radiation Oncology         [phone] ________________________________  Name: KIET GEER MRN: [id number]  Date: 01/16/2015  DOB: [birth year]  Weekly Radiation Therapy Management  DIAGNOSIS: Stage IIIA (T3, N1, M0) non-small cell lung cancer, squamous cell carcinoma, presenting with a large right upper lobe lung mass with hilar lymphadenopathy diagnosed in February 2016.   Current Dose: 16 Gy     Planned Dose:  60 Gy  Narrative . . . . . . . . The patient presents for routine under treatment assessment.                                   The patient is without complaint. Breathing stable, no swallowing difficulties                                 Set-up films were reviewed.                                 The chart was checked. Physical Findings. . .  height is 5\' 10"  (1.778 m) and weight is 197 lb (89.359 kg). His oral temperature is 97.7 F (36.5 C). His blood pressure is 112/65 and his pulse is 74. His respiration is 20 and oxygen saturation is 100%. . mild wheezing noted throughout both lungs.  The heart has a regular rhythm and rate  Impression . . . . . . . The patient is tolerating radiation. Plan . . . . . . . . . . . . Continue treatment as planned.  A prescription for Carafate suspension has been given to the patient.  ________________________________   Blair Promise, PhD, MD

## 2015-01-16 NOTE — Progress Notes (Signed)
[name] has completed 8 fractions to his right chest.  He denies pain.  He reports shortness of breath with activity.  He reports and occasional cough with clear sputum.  He denies hemoptysis.  He denies a sore throat and trouble swallowing.  He denies any skin irritation on his chest.  He denies fatigue.  He had chemotherapy yesterday.    BP 112/65 mmHg  Pulse 74  Temp(Src) 97.7 F (36.5 C) (Oral)  Resp 20  Ht 5\' 10"  (1.778 m)  Wt 197 lb (89.359 kg)  BMI 28.27 kg/m2  SpO2 100%

## 2015-01-17 ENCOUNTER — Ambulatory Visit
Admission: RE | Admit: 2015-01-17 | Discharge: 2015-01-17 | Disposition: A | Discharge status: Home / Self Care | Payer: Medicare Other | Source: Ambulatory Visit | Attending: Radiation Oncology | Admitting: Radiation Oncology

## 2015-01-17 ENCOUNTER — Telehealth: Payer: Self-pay | Admitting: Oncology

## 2015-01-17 DIAGNOSIS — Z510 Encounter for antineoplastic radiation therapy: Secondary | ICD-10-CM | POA: Diagnosis not present

## 2015-01-17 NOTE — Telephone Encounter (Signed)
Called [facility] and had them return the Carafate prescription in their system.  Then called [facility] on [address] and spoke to Ramsey.  She will transfer the Carafate prescription to [facility].  Called [facility] and spoke to his granddaughter, Erline Levine and informed her that the Carafate is available for pickup at [facility].

## 2015-01-18 ENCOUNTER — Ambulatory Visit
Admission: RE | Admit: 2015-01-18 | Discharge: 2015-01-18 | Disposition: A | Discharge status: Home / Self Care | Payer: Medicare Other | Source: Ambulatory Visit | Attending: Radiation Oncology | Admitting: Radiation Oncology

## 2015-01-18 DIAGNOSIS — Z510 Encounter for antineoplastic radiation therapy: Secondary | ICD-10-CM | POA: Diagnosis not present

## 2015-01-19 ENCOUNTER — Ambulatory Visit
Admission: RE | Admit: 2015-01-19 | Discharge: 2015-01-19 | Disposition: A | Discharge status: Home / Self Care | Payer: Medicare Other | Source: Ambulatory Visit | Attending: Radiation Oncology | Admitting: Radiation Oncology

## 2015-01-19 ENCOUNTER — Ambulatory Visit: Payer: Medicare Other

## 2015-01-19 ENCOUNTER — Other Ambulatory Visit: Payer: Self-pay | Admitting: Medical Oncology

## 2015-01-19 DIAGNOSIS — C3490 Malignant neoplasm of unspecified part of unspecified bronchus or lung: Secondary | ICD-10-CM

## 2015-01-22 ENCOUNTER — Ambulatory Visit (HOSPITAL_BASED_OUTPATIENT_CLINIC_OR_DEPARTMENT_OTHER): Payer: Medicare Other

## 2015-01-22 ENCOUNTER — Other Ambulatory Visit (HOSPITAL_BASED_OUTPATIENT_CLINIC_OR_DEPARTMENT_OTHER): Payer: Medicare Other

## 2015-01-22 ENCOUNTER — Ambulatory Visit
Admission: RE | Admit: 2015-01-22 | Discharge: 2015-01-22 | Disposition: A | Discharge status: Home / Self Care | Payer: Medicare Other | Source: Ambulatory Visit | Attending: Radiation Oncology | Admitting: Radiation Oncology

## 2015-01-22 VITALS — BP 99/56 | HR 86 | Temp 97.7°F | Resp 20

## 2015-01-22 DIAGNOSIS — C3411 Malignant neoplasm of upper lobe, right bronchus or lung: Secondary | ICD-10-CM | POA: Diagnosis not present

## 2015-01-22 DIAGNOSIS — C3490 Malignant neoplasm of unspecified part of unspecified bronchus or lung: Secondary | ICD-10-CM

## 2015-01-22 DIAGNOSIS — Z5111 Encounter for antineoplastic chemotherapy: Secondary | ICD-10-CM

## 2015-01-22 DIAGNOSIS — C3491 Malignant neoplasm of unspecified part of right bronchus or lung: Secondary | ICD-10-CM

## 2015-01-22 DIAGNOSIS — Z510 Encounter for antineoplastic radiation therapy: Secondary | ICD-10-CM | POA: Diagnosis not present

## 2015-01-22 LAB — CBC WITH DIFFERENTIAL/PLATELET
BASO%: 0.8 % (ref 0.0–2.0)
BASOS ABS: 0 10*3/uL (ref 0.0–0.1)
EOS ABS: 0.1 10*3/uL (ref 0.0–0.5)
EOS%: 1.8 % (ref 0.0–7.0)
HEMATOCRIT: 32 % — AB (ref 38.4–49.9)
HEMOGLOBIN: 10.2 g/dL — AB (ref 13.0–17.1)
LYMPH%: 8.3 % — ABNORMAL LOW (ref 14.0–49.0)
MCH: 25.3 pg — ABNORMAL LOW (ref 27.2–33.4)
MCHC: 31.8 g/dL — ABNORMAL LOW (ref 32.0–36.0)
MCV: 79.7 fL (ref 79.3–98.0)
MONO#: 0.2 10*3/uL (ref 0.1–0.9)
MONO%: 7.4 % (ref 0.0–14.0)
NEUT#: 2.3 10*3/uL (ref 1.5–6.5)
NEUT%: 81.7 % — ABNORMAL HIGH (ref 39.0–75.0)
Platelets: 137 10*3/uL — ABNORMAL LOW (ref 140–400)
RBC: 4.02 10*6/uL — ABNORMAL LOW (ref 4.20–5.82)
RDW: 17.3 % — ABNORMAL HIGH (ref 11.0–14.6)
WBC: 2.8 10*3/uL — AB (ref 4.0–10.3)
lymph#: 0.2 10*3/uL — ABNORMAL LOW (ref 0.9–3.3)

## 2015-01-22 LAB — COMPREHENSIVE METABOLIC PANEL (CC13)
ALT: 17 U/L (ref 0–55)
AST: 13 U/L (ref 5–34)
Albumin: 2.6 g/dL — ABNORMAL LOW (ref 3.5–5.0)
Alkaline Phosphatase: 73 U/L (ref 40–150)
Anion Gap: 13 mEq/L — ABNORMAL HIGH (ref 3–11)
BILIRUBIN TOTAL: 0.27 mg/dL (ref 0.20–1.20)
BUN: 20.9 mg/dL (ref 7.0–26.0)
CALCIUM: 8.7 mg/dL (ref 8.4–10.4)
CHLORIDE: 104 meq/L (ref 98–109)
CO2: 22 mEq/L (ref 22–29)
CREATININE: 1.1 mg/dL (ref 0.7–1.3)
EGFR: 66 mL/min/{1.73_m2} — ABNORMAL LOW (ref >90)
Glucose: 123 mg/dl (ref 70–140)
Potassium: 3.6 mEq/L (ref 3.5–5.1)
Sodium: 139 mEq/L (ref 136–145)
Total Protein: 5.9 g/dL — ABNORMAL LOW (ref 6.4–8.3)

## 2015-01-22 MED ORDER — SODIUM CHLORIDE 0.9 % IV SOLN
250.0000 mL | Freq: Once | INTRAVENOUS | Status: AC
  Administered 2015-01-22: 12:00:00 via INTRAVENOUS

## 2015-01-22 MED ORDER — PACLITAXEL CHEMO INJECTION 300 MG/50ML
45.0000 mg/m2 | Freq: Once | INTRAVENOUS | Status: AC
  Administered 2015-01-22: 96 mg via INTRAVENOUS
  Filled 2015-01-22: qty 16

## 2015-01-22 MED ORDER — FAMOTIDINE IN NACL 20-0.9 MG/50ML-% IV SOLN
INTRAVENOUS | Status: AC
  Filled 2015-01-22: qty 50

## 2015-01-22 MED ORDER — DIPHENHYDRAMINE HCL 50 MG/ML IJ SOLN
50.0000 mg | Freq: Once | INTRAMUSCULAR | Status: AC
  Administered 2015-01-22: 50 mg via INTRAVENOUS

## 2015-01-22 MED ORDER — SODIUM CHLORIDE 0.9 % IV SOLN
186.2000 mg | Freq: Once | INTRAVENOUS | Status: AC
  Administered 2015-01-22: 190 mg via INTRAVENOUS
  Filled 2015-01-22: qty 19

## 2015-01-22 MED ORDER — FAMOTIDINE IN NACL 20-0.9 MG/50ML-% IV SOLN
20.0000 mg | Freq: Once | INTRAVENOUS | Status: AC
  Administered 2015-01-22: 20 mg via INTRAVENOUS

## 2015-01-22 MED ORDER — SODIUM CHLORIDE 0.9 % IV SOLN
Freq: Once | INTRAVENOUS | Status: AC
  Administered 2015-01-22: 12:00:00 via INTRAVENOUS
  Filled 2015-01-22: qty 8

## 2015-01-22 MED ORDER — DIPHENHYDRAMINE HCL 50 MG/ML IJ SOLN
INTRAMUSCULAR | Status: AC
  Filled 2015-01-22: qty 1

## 2015-01-22 NOTE — Progress Notes (Signed)
Report given to Dr. Julien Nordmann re: reviewed labs (WBC 2.8), BP 99/56 P-86; Pt went to PCP Saturday "just don't feel well; bad heartburn and I was wheezing"  PCP prescribed Levofloxacin 500mg  daily X7 days.  Per Dr. Julien Nordmann; ok to proceed with treatment today or option given to hold and re-schedule.  Pt agreed to proceed today with treatment.

## 2015-01-22 NOTE — Patient Instructions (Signed)
[facility] Discharge Instructions for Patients Receiving Chemotherapy  Today you received the following chemotherapy agents Taxol and Carboplatin  To help prevent nausea and vomiting after your treatment, we encourage you to take your nausea medication Compazine 10 mg every 6 hours as needed.   If you develop nausea and vomiting that is not controlled by your nausea medication, call the clinic.   BELOW ARE SYMPTOMS THAT SHOULD BE REPORTED IMMEDIATELY:  *FEVER GREATER THAN 100.5 F  *CHILLS WITH OR WITHOUT FEVER  NAUSEA AND VOMITING THAT IS NOT CONTROLLED WITH YOUR NAUSEA MEDICATION  *UNUSUAL SHORTNESS OF BREATH  *UNUSUAL BRUISING OR BLEEDING  TENDERNESS IN MOUTH AND THROAT WITH OR WITHOUT PRESENCE OF ULCERS  *URINARY PROBLEMS  *BOWEL PROBLEMS  UNUSUAL RASH Items with * indicate a potential emergency and should be followed up as soon as possible.  Feel free to call the clinic you have any questions or concerns. The clinic phone number is [phone].  Please show the [place] at check-in to the Emergency Department and triage nurse.

## 2015-01-23 ENCOUNTER — Ambulatory Visit
Admission: RE | Admit: 2015-01-23 | Discharge: 2015-01-23 | Disposition: A | Discharge status: Home / Self Care | Payer: Medicare Other | Source: Ambulatory Visit | Attending: Radiation Oncology | Admitting: Radiation Oncology

## 2015-01-23 ENCOUNTER — Encounter: Payer: Self-pay | Admitting: Radiation Oncology

## 2015-01-23 VITALS — BP 95/48 | HR 93 | Temp 98.0°F | Resp 18 | Wt 196.6 lb

## 2015-01-23 DIAGNOSIS — C3490 Malignant neoplasm of unspecified part of unspecified bronchus or lung: Secondary | ICD-10-CM

## 2015-01-23 DIAGNOSIS — Z510 Encounter for antineoplastic radiation therapy: Secondary | ICD-10-CM | POA: Diagnosis not present

## 2015-01-23 NOTE — Progress Notes (Signed)
  Radiation Oncology         [phone] ________________________________  Name: [name] MRN: [id number]  Date: 01/23/2015  DOB: [birth year]  Weekly Radiation Therapy Management  DIAGNOSIS: Stage IIIA (T3, N1, M0) non-small cell lung cancer, squamous cell carcinoma, presenting with a large right upper lobe lung mass with hilar lymphadenopathy diagnosed in February 2016.   Current Dose: 24 Gy     Planned Dose:  60 Gy  Narrative . . . . . . . . The patient presents for routine under treatment assessment.                                   The patient is without complaint. He is feeling much better after being placed on antibiotics over the weekend by his primary care physician. Patient complained of Carafate causing significant burning with swallowing have recommended he hold off on using this at this time. He denies any pain with swallowing or difficulty swallowing at this time.                                 Set-up films were reviewed.                                 The chart was checked. Physical Findings. . .  weight is 196 lb 9.6 oz (89.177 kg). His oral temperature is 98 F (36.7 C). His blood pressure is 95/48 and his pulse is 93. His respiration is 18 and oxygen saturation is 100%. . The lungs are clear. No wheezing Impression . . . . . . . The patient is tolerating radiation. Plan . . . . . . . . . . . . Continue treatment as planned.  ________________________________   Blair Promise, PhD, MD

## 2015-01-23 NOTE — Progress Notes (Signed)
Reports that he had a horrible weekend. Explains he felt so bad he went to see his PCP on Saturday. PCP prescribed levaquin 500 mg once a day for seven days. Patient reports he feeling much better today. Reports PCP heard wheezing on Saturday. Reports horrible heart burn Friday. Reports occasional productive cough with clear sputum. Denies pain associated with swallowing. Refuses to take carafate; contributes events of the weekend to carafate side effects.

## 2015-01-24 ENCOUNTER — Ambulatory Visit
Admission: RE | Admit: 2015-01-24 | Discharge: 2015-01-24 | Disposition: A | Discharge status: Home / Self Care | Payer: Medicare Other | Source: Ambulatory Visit | Attending: Radiation Oncology | Admitting: Radiation Oncology

## 2015-01-24 DIAGNOSIS — Z510 Encounter for antineoplastic radiation therapy: Secondary | ICD-10-CM | POA: Diagnosis not present

## 2015-01-25 ENCOUNTER — Ambulatory Visit
Admission: RE | Admit: 2015-01-25 | Discharge: 2015-01-25 | Disposition: A | Discharge status: Home / Self Care | Payer: Medicare Other | Source: Ambulatory Visit | Attending: Radiation Oncology | Admitting: Radiation Oncology

## 2015-01-25 DIAGNOSIS — Z510 Encounter for antineoplastic radiation therapy: Secondary | ICD-10-CM | POA: Diagnosis not present

## 2015-01-26 ENCOUNTER — Ambulatory Visit
Admission: RE | Admit: 2015-01-26 | Discharge: 2015-01-26 | Disposition: A | Discharge status: Home / Self Care | Payer: Medicare Other | Source: Ambulatory Visit | Attending: Radiation Oncology | Admitting: Radiation Oncology

## 2015-01-26 DIAGNOSIS — Z510 Encounter for antineoplastic radiation therapy: Secondary | ICD-10-CM | POA: Diagnosis not present

## 2015-01-29 ENCOUNTER — Ambulatory Visit (HOSPITAL_BASED_OUTPATIENT_CLINIC_OR_DEPARTMENT_OTHER): Payer: Medicare Other | Admitting: Physician Assistant

## 2015-01-29 ENCOUNTER — Other Ambulatory Visit: Payer: Self-pay | Admitting: Medical Oncology

## 2015-01-29 ENCOUNTER — Telehealth: Payer: Self-pay | Admitting: Internal Medicine

## 2015-01-29 ENCOUNTER — Ambulatory Visit: Payer: Medicare Other

## 2015-01-29 ENCOUNTER — Other Ambulatory Visit (HOSPITAL_BASED_OUTPATIENT_CLINIC_OR_DEPARTMENT_OTHER): Payer: Medicare Other

## 2015-01-29 ENCOUNTER — Ambulatory Visit
Admission: RE | Admit: 2015-01-29 | Discharge: 2015-01-29 | Disposition: A | Discharge status: Home / Self Care | Payer: Medicare Other | Source: Ambulatory Visit | Attending: Radiation Oncology | Admitting: Radiation Oncology

## 2015-01-29 ENCOUNTER — Encounter: Payer: Self-pay | Admitting: Physician Assistant

## 2015-01-29 VITALS — BP 107/61 | HR 85 | Temp 97.8°F | Resp 17 | Ht 70.0 in | Wt 195.1 lb

## 2015-01-29 DIAGNOSIS — C3490 Malignant neoplasm of unspecified part of unspecified bronchus or lung: Secondary | ICD-10-CM

## 2015-01-29 DIAGNOSIS — C3411 Malignant neoplasm of upper lobe, right bronchus or lung: Secondary | ICD-10-CM | POA: Diagnosis not present

## 2015-01-29 DIAGNOSIS — Z510 Encounter for antineoplastic radiation therapy: Secondary | ICD-10-CM | POA: Diagnosis not present

## 2015-01-29 LAB — CBC WITH DIFFERENTIAL/PLATELET
BASO%: 0.3 % (ref 0.0–2.0)
BASOS ABS: 0 10*3/uL (ref 0.0–0.1)
EOS%: 0 % (ref 0.0–7.0)
Eosinophils Absolute: 0 10*3/uL (ref 0.0–0.5)
HCT: 31 % — ABNORMAL LOW (ref 38.4–49.9)
HEMOGLOBIN: 9.8 g/dL — AB (ref 13.0–17.1)
LYMPH%: 5.4 % — ABNORMAL LOW (ref 14.0–49.0)
MCH: 25.9 pg — AB (ref 27.2–33.4)
MCHC: 31.6 g/dL — ABNORMAL LOW (ref 32.0–36.0)
MCV: 82 fL (ref 79.3–98.0)
MONO#: 0.2 10*3/uL (ref 0.1–0.9)
MONO%: 6.4 % (ref 0.0–14.0)
NEUT#: 2.6 10*3/uL (ref 1.5–6.5)
NEUT%: 87.9 % — AB (ref 39.0–75.0)
Platelets: 138 10*3/uL — ABNORMAL LOW (ref 140–400)
RBC: 3.78 10*6/uL — AB (ref 4.20–5.82)
RDW: 17.8 % — ABNORMAL HIGH (ref 11.0–14.6)
WBC: 3 10*3/uL — AB (ref 4.0–10.3)
lymph#: 0.2 10*3/uL — ABNORMAL LOW (ref 0.9–3.3)

## 2015-01-29 LAB — COMPREHENSIVE METABOLIC PANEL (CC13)
ALK PHOS: 94 U/L (ref 40–150)
ALT: 21 U/L (ref 0–55)
AST: 16 U/L (ref 5–34)
Albumin: 2.8 g/dL — ABNORMAL LOW (ref 3.5–5.0)
Anion Gap: 14 mEq/L — ABNORMAL HIGH (ref 3–11)
BILIRUBIN TOTAL: 0.29 mg/dL (ref 0.20–1.20)
BUN: 21 mg/dL (ref 7.0–26.0)
CALCIUM: 8.8 mg/dL (ref 8.4–10.4)
CHLORIDE: 103 meq/L (ref 98–109)
CO2: 23 mEq/L (ref 22–29)
Creatinine: 1.1 mg/dL (ref 0.7–1.3)
EGFR: 61 mL/min/{1.73_m2} — AB (ref >90)
Glucose: 98 mg/dl (ref 70–140)
Potassium: 3.5 mEq/L (ref 3.5–5.1)
SODIUM: 140 meq/L (ref 136–145)
Total Protein: 6 g/dL — ABNORMAL LOW (ref 6.4–8.3)

## 2015-01-29 NOTE — Patient Instructions (Signed)
We will hold chemotherapy for this week however continue with daily radiation treatments as scheduled Resume weekly chemotherapy next week in follow-up in 2 weeks

## 2015-01-29 NOTE — Progress Notes (Addendum)
[place] [facility] [phone]   [phone]  OFFICE PROGRESS NOTE  BURNETT,BRENT A, MD [address] [place] [id number]  DIAGNOSIS: Stage IIIA (T3, N1, M0) non-small cell lung cancer, squamous cell carcinoma with positive PDL 1 expression, presented with large right upper lobe lung mass with hilar lymphadenopathy diagnosed in February 2016.  PRIOR THERAPY: None  CURRENT THERAPY: Concurrent chemoradiation with weekly carboplatin for AUC of 2 and paclitaxel 45 MG/M2. First dose 01/10/2015.  INTERVAL HISTORY: [name] 81 y.o. male returns to the clinic today for follow-up visit accompanied by his granddaughter. He complains of increased fatigue" feeling like I've been run over by a big truck". He initially had some issues with constipation and took mural lacks and then developed diarrhea. He is currently taking Imodium with good control of his symptoms. He has been 24 hours without any episodes of diarrhea. Complains of feeling dizzy he's been drinking plenty of water and Gatorade and boost with protein. He reports that the symptoms of feeling worn out fatigue malaise dizziness shortness of breath occur approximately 3 days after chemotherapy and have done so for the last 2 consecutive weeks. He does not feel up to chemotherapy today.  He had his radiation treatment earlier this morning. The patient tolerated his first dose of chemo well. Denies significant chest pain and shortness of breath at rest. Denies cough, fever, and chills. The patient denied having any significant weight loss or night sweats. He has no nausea or vomiting.   MEDICAL HISTORY: Past Medical History  Diagnosis Date  . Syncope   . COPD (chronic obstructive pulmonary disease)   . Chronic atrial fibrillation   . GERD (gastroesophageal reflux disease)   . Coronary artery disease   . Hypertension   . Kidney stone   . CAD (coronary artery disease), native coronary  artery 12/03/2014  . Thoracic ascending aortic aneurysm 12/03/2014    4.2 x 4.3 cm noted on CT scan February 2016   . SCC (squamous cell carcinoma of lung)     ALLERGIES:  is allergic to codeine.  MEDICATIONS:  Current Outpatient Prescriptions  Medication Sig Dispense Refill  . albuterol (PROVENTIL HFA;VENTOLIN HFA) 108 (90 BASE) MCG/ACT inhaler Inhale 1-2 puffs into the lungs every 6 (six) hours as needed for wheezing or shortness of breath.    . budesonide-formoterol (SYMBICORT) 160-4.5 MCG/ACT inhaler Inhale 2 puffs into the lungs 2 (two) times daily.    Marland Kitchen diltiazem (CARDIZEM) 90 MG tablet Take 90 mg by mouth 2 (two) times daily. Take two tablets in the morning and take one tablet in the afternoon.    . furosemide (LASIX) 40 MG tablet Take 0.5 tablets (20 mg total) by mouth daily.    . hyaluronate sodium (RADIAPLEXRX) GEL Apply 1 application topically once.    . pantoprazole (PROTONIX) 40 MG tablet Take 1 tablet (40 mg total) by mouth 2 (two) times daily. 60 tablet 1  . potassium chloride SA (K-DUR,KLOR-CON) 20 MEQ tablet Take 20 mEq by mouth daily.    . prochlorperazine (COMPAZINE) 10 MG tablet Take 1 tablet (10 mg total) by mouth every 6 (six) hours as needed for nausea or vomiting. 30 tablet 0  . rivaroxaban (XARELTO) 20 MG TABS tablet Take 20 mg by mouth daily with supper.    . sucralfate (CARAFATE) 1 GM/10ML suspension Take 10 mLs (1 g total) by mouth 4 (four) times daily -  with meals and at bedtime.  420 mL 0  . zolpidem (AMBIEN) 10 MG tablet Take 5 mg by mouth at bedtime as needed for sleep.    . diphenhydramine-acetaminophen (TYLENOL PM) 25-500 MG TABS Take 2 tablets by mouth at bedtime as needed.    Marland Kitchen levofloxacin (LEVAQUIN) 500 MG tablet   0   No current facility-administered medications for this visit.    SURGICAL HISTORY:  Past Surgical History  Procedure Laterality Date  . Appendectomy    . Cholecystectomy    . Esophageal dilation  2012  . Video bronchoscopy Bilateral  11/29/2014    Procedure: VIDEO BRONCHOSCOPY WITHOUT FLUORO;  Surgeon: Collene Gobble, MD;  Location: [facility];  Service: Cardiopulmonary;  Laterality: Bilateral;  . Esophagogastroduodenoscopy N/A 11/30/2014    Procedure: ESOPHAGOGASTRODUODENOSCOPY (EGD);  Surgeon: Arta Silence, MD;  Location: [facility] ENDOSCOPY;  Service: Endoscopy;  Laterality: N/A;    REVIEW OF SYSTEMS:  Constitutional: positive for fatigue, malaise and increased Eyes: negative Ears, nose, mouth, throat, and face: negative Respiratory: positive for dyspnea on exertion Cardiovascular: negative Gastrointestinal: negative Genitourinary:negative Integument/breast: negative Hematologic/lymphatic: negative Musculoskeletal:negative Neurological: negative Behavioral/Psych: negative Endocrine: negative Allergic/Immunologic: negative   PHYSICAL EXAMINATION: General appearance: alert, cooperative, fatigued and no distress Head: Normocephalic, without obvious abnormality, atraumatic Neck: no adenopathy, no JVD, supple, symmetrical, trachea midline and thyroid not enlarged, symmetric, no tenderness/mass/nodules Lymph nodes: Cervical, supraclavicular, and axillary nodes normal. Resp: clear to auscultation bilaterally Back: symmetric, no curvature. ROM normal. No CVA tenderness. Cardio: regular rate and rhythm, S1, S2 normal, no murmur, click, rub or gallop GI: soft, non-tender; bowel sounds normal; no masses,  no organomegaly Extremities: extremities normal, atraumatic, no cyanosis or edema Neurologic: Alert and oriented X 3, normal strength and tone. Normal symmetric reflexes. Normal coordination and gait  ECOG PERFORMANCE STATUS: 1 - Symptomatic but completely ambulatory  Blood pressure 107/61, pulse 85, temperature 97.8 F (36.6 C), temperature source Oral, resp. rate 17, height '5\' 10"'$  (1.778 m), weight 195 lb 1.6 oz (88.497 kg), SpO2 99 %.  LABORATORY DATA: Lab Results  Component Value Date   WBC 3.0* 01/29/2015   HGB  9.8* 01/29/2015   HCT 31.0* 01/29/2015   MCV 82.0 01/29/2015   PLT 138* 01/29/2015      Chemistry      Component Value Date/Time   NA 140 01/29/2015 1022   NA 138 11/29/2014 0340   K 3.5 01/29/2015 1022   K 3.7 11/29/2014 0340   CL 108 11/29/2014 0340   CO2 23 01/29/2015 1022   CO2 23 11/29/2014 0340   BUN 21.0 01/29/2015 1022   BUN 12 11/29/2014 0340   CREATININE 1.1 01/29/2015 1022   CREATININE 1.02 11/29/2014 0340      Component Value Date/Time   CALCIUM 8.8 01/29/2015 1022   CALCIUM 8.6 11/29/2014 0340   ALKPHOS 94 01/29/2015 1022   ALKPHOS 73 04/09/2011 0419   AST 16 01/29/2015 1022   AST 42* 04/09/2011 0419   ALT 21 01/29/2015 1022   ALT 52 04/09/2011 0419   BILITOT 0.29 01/29/2015 1022   BILITOT 0.7 04/09/2011 0419       RADIOGRAPHIC STUDIES: No results found.  ASSESSMENT AND PLAN: This is a very pleasant 81 year old white male recently diagnosed with a stage IIIa non-small cell lung cancer, squamous cell carcinoma. The recent MRI of the brain showed no evidence for metastatic disease to the brain and the PET scan showed no evidence for metastatic disease outside the chest. He is currently receiving concurrent chemoradiation with weekly carboplatin  for AUC of 2 and paclitaxel 45 MG/M2 for a total of 6-7 weeks.  The patient was seen and examined with Dr. Julien Nordmann. He reports increased fatigue and malaise with his current treatment. Symptoms tend to occur approximate 3 days after chemotherapy. We will skip his weekly chemotherapy treatment today. He will continue with his course of concurrent chemoradiation as scheduled resuming chemotherapy next week. He'll follow-up in 2 weeks for another symptom management visit. monitor his dyspnea.   He will return in one week for labs and chemotherapy. He will come back for follow-up visit in 2 weeks for reevaluation and management of any adverse effect of his treatment. The patient was advised to call immediately if he has any  concerning symptoms in the interval. The patient voices understanding of current disease status and treatment options and is in agreement with the current care plan.  All questions were answered. The patient knows to call the clinic with any problems, questions or concerns. We can certainly see the patient much sooner if necessary.  Carlton Adam, PA-C 01/29/2015    ADDENDUM:  Hematology/Oncology Attending:  I had a face to face encounter with the patient. I recommended his care plan. This is a very pleasant 81 years old white male with stage IIIa non-small cell lung cancer currently undergoing treatment with a course of concurrent chemoradiation with weekly carboplatin and paclitaxel status post 3 cycles. Has been complaining of constipation for several days and the patient to MiraLAX and other laxative which resulted in diarrhea for the last 24 hours. The patient is feeling tired and fatigue. He would like to delay his chemotherapy by 1 week. I will reschedule his chemotherapy to be resumed in one week. The patient will continue with his current radiotherapy as scheduled. He would come back for follow-up visit in 2 weeks for reevaluation and management of any adverse effect of his treatment. The patient was advised to call immediately if he has any concerning symptoms in the interval.  Disclaimer: This note was dictated with voice recognition software. Similar sounding words can inadvertently be transcribed and may be missed upon review. Eilleen Kempf., MD 02/03/2015

## 2015-01-29 NOTE — Telephone Encounter (Signed)
added appt pt will get print out in tx. °

## 2015-01-30 ENCOUNTER — Encounter: Payer: Self-pay | Admitting: Radiation Oncology

## 2015-01-30 ENCOUNTER — Ambulatory Visit
Admission: RE | Admit: 2015-01-30 | Discharge: 2015-01-30 | Disposition: A | Discharge status: Home / Self Care | Payer: Medicare Other | Source: Ambulatory Visit | Attending: Radiation Oncology | Admitting: Radiation Oncology

## 2015-01-30 VITALS — BP 104/66 | HR 91 | Temp 97.6°F | Ht 70.0 in | Wt 198.0 lb

## 2015-01-30 DIAGNOSIS — C3491 Malignant neoplasm of unspecified part of right bronchus or lung: Secondary | ICD-10-CM

## 2015-01-30 DIAGNOSIS — Z510 Encounter for antineoplastic radiation therapy: Secondary | ICD-10-CM | POA: Diagnosis not present

## 2015-01-30 NOTE — Progress Notes (Signed)
[name] has completed 17 fractions to his right chest.  He denies pain but says his tongue is sore.  He does not have a white coating on his tongue.  He did not have chemotherapy this week because he did not feel good.  He will have it next Monday.  He reports feeling weak and short of breat over the weekend.  He reports that he vomited for the first time this morning.  He started taking compazine this morning. He denies pain with swallowing.  He has not started taking Carafate yet.  He had the first bottle of Carafate filled but says his insurance denied the prior authorization because it is not FDA approved.  He reports coughing very little with white sputum.  He denies hemoptysis.  He reports shortness of breath with activity.  The skin on his upper mid chest is red and he reports it is itching.  He reports using radiaplex once after treatment.  Recommended hydrocortisone cream as needed.  BP 104/66 mmHg  Pulse 91  Temp(Src) 97.6 F (36.4 C) (Oral)  Ht '5\' 10"'$  (1.778 m)  Wt 198 lb (89.812 kg)  BMI 28.41 kg/m2  SpO2 100%

## 2015-01-30 NOTE — Progress Notes (Signed)
  Radiation Oncology         [phone] ________________________________  Name: [name] MRN: [id number]  Date: 01/30/2015  DOB: 09/25/1933  Weekly Radiation Therapy Management  Stage IIIA (T3, N1, M0) non-small cell lung cancer, squamous cell carcinoma, presenting with a large right upper lobe lung mass with hilar lymphadenopathy diagnosed in February 2016.   Current Dose: 34 Gy     Planned Dose:  60 Gy  Narrative . . . . . . . . The patient presents for routine under treatment assessment.                                   The patient felt poorly over the weekend. He had significant fatigue as well as problems with diarrhea. In light of this issue the patient's chemotherapy is held this week. Patient denies any swallowing problems. He has not been using his Carafate as this causes a burning sensation when he did use it.                                 Set-up films were reviewed.                                 The chart was checked. Physical Findings. . .  height is '5\' 10"'$  (1.778 m) and weight is 198 lb (89.812 kg). His oral temperature is 97.6 F (36.4 C). His blood pressure is 104/66 and his pulse is 91. His oxygen saturation is 100%. . Mildly decreased breath sounds in the right upper lung field. Impression . . . . . . . The patient is tolerating radiation. Plan . . . . . . . . . . . . Continue treatment as planned.  ________________________________   Blair Promise, PhD, MD

## 2015-01-31 ENCOUNTER — Ambulatory Visit
Admission: RE | Admit: 2015-01-31 | Discharge: 2015-01-31 | Disposition: A | Discharge status: Home / Self Care | Payer: Medicare Other | Source: Ambulatory Visit | Attending: Radiation Oncology | Admitting: Radiation Oncology

## 2015-01-31 DIAGNOSIS — Z510 Encounter for antineoplastic radiation therapy: Secondary | ICD-10-CM | POA: Diagnosis not present

## 2015-02-01 ENCOUNTER — Ambulatory Visit
Admission: RE | Admit: 2015-02-01 | Discharge: 2015-02-01 | Disposition: A | Discharge status: Home / Self Care | Payer: Medicare Other | Source: Ambulatory Visit | Attending: Radiation Oncology | Admitting: Radiation Oncology

## 2015-02-01 DIAGNOSIS — Z510 Encounter for antineoplastic radiation therapy: Secondary | ICD-10-CM | POA: Diagnosis not present

## 2015-02-02 ENCOUNTER — Ambulatory Visit
Admission: RE | Admit: 2015-02-02 | Discharge: 2015-02-02 | Disposition: A | Discharge status: Home / Self Care | Payer: Medicare Other | Source: Ambulatory Visit | Attending: Radiation Oncology | Admitting: Radiation Oncology

## 2015-02-02 DIAGNOSIS — Z510 Encounter for antineoplastic radiation therapy: Secondary | ICD-10-CM | POA: Diagnosis not present

## 2015-02-05 ENCOUNTER — Ambulatory Visit
Admission: RE | Admit: 2015-02-05 | Discharge: 2015-02-05 | Disposition: A | Discharge status: Home / Self Care | Payer: Medicare Other | Source: Ambulatory Visit | Attending: Radiation Oncology | Admitting: Radiation Oncology

## 2015-02-05 ENCOUNTER — Other Ambulatory Visit (HOSPITAL_BASED_OUTPATIENT_CLINIC_OR_DEPARTMENT_OTHER): Payer: Medicare Other

## 2015-02-05 ENCOUNTER — Ambulatory Visit (HOSPITAL_BASED_OUTPATIENT_CLINIC_OR_DEPARTMENT_OTHER): Payer: Medicare Other

## 2015-02-05 VITALS — BP 119/82 | HR 92 | Temp 98.2°F | Resp 17

## 2015-02-05 DIAGNOSIS — C3411 Malignant neoplasm of upper lobe, right bronchus or lung: Secondary | ICD-10-CM

## 2015-02-05 DIAGNOSIS — Z510 Encounter for antineoplastic radiation therapy: Secondary | ICD-10-CM | POA: Diagnosis not present

## 2015-02-05 DIAGNOSIS — Z5111 Encounter for antineoplastic chemotherapy: Secondary | ICD-10-CM | POA: Diagnosis not present

## 2015-02-05 DIAGNOSIS — C3490 Malignant neoplasm of unspecified part of unspecified bronchus or lung: Secondary | ICD-10-CM

## 2015-02-05 DIAGNOSIS — C3491 Malignant neoplasm of unspecified part of right bronchus or lung: Secondary | ICD-10-CM

## 2015-02-05 LAB — CBC WITH DIFFERENTIAL/PLATELET
BASO%: 0.6 % (ref 0.0–2.0)
Basophils Absolute: 0 10*3/uL (ref 0.0–0.1)
EOS%: 0.3 % (ref 0.0–7.0)
Eosinophils Absolute: 0 10*3/uL (ref 0.0–0.5)
HCT: 29.4 % — ABNORMAL LOW (ref 38.4–49.9)
HGB: 9.2 g/dL — ABNORMAL LOW (ref 13.0–17.1)
LYMPH#: 0.3 10*3/uL — AB (ref 0.9–3.3)
LYMPH%: 9.5 % — AB (ref 14.0–49.0)
MCH: 26.4 pg — AB (ref 27.2–33.4)
MCHC: 31.3 g/dL — ABNORMAL LOW (ref 32.0–36.0)
MCV: 84.2 fL (ref 79.3–98.0)
MONO#: 0.6 10*3/uL (ref 0.1–0.9)
MONO%: 17.8 % — AB (ref 0.0–14.0)
NEUT%: 71.8 % (ref 39.0–75.0)
NEUTROS ABS: 2.3 10*3/uL (ref 1.5–6.5)
Platelets: 121 10*3/uL — ABNORMAL LOW (ref 140–400)
RBC: 3.49 10*6/uL — AB (ref 4.20–5.82)
RDW: 19.4 % — ABNORMAL HIGH (ref 11.0–14.6)
WBC: 3.3 10*3/uL — AB (ref 4.0–10.3)

## 2015-02-05 LAB — COMPREHENSIVE METABOLIC PANEL (CC13)
ALBUMIN: 2.7 g/dL — AB (ref 3.5–5.0)
ALK PHOS: 88 U/L (ref 40–150)
ALT: 13 U/L (ref 0–55)
AST: 13 U/L (ref 5–34)
Anion Gap: 8 mEq/L (ref 3–11)
BILIRUBIN TOTAL: 0.24 mg/dL (ref 0.20–1.20)
BUN: 16.7 mg/dL (ref 7.0–26.0)
CALCIUM: 8.6 mg/dL (ref 8.4–10.4)
CO2: 25 meq/L (ref 22–29)
CREATININE: 1 mg/dL (ref 0.7–1.3)
Chloride: 107 mEq/L (ref 98–109)
EGFR: 74 mL/min/{1.73_m2} — ABNORMAL LOW (ref >90)
Glucose: 101 mg/dl (ref 70–140)
POTASSIUM: 3.8 meq/L (ref 3.5–5.1)
SODIUM: 140 meq/L (ref 136–145)
Total Protein: 5.8 g/dL — ABNORMAL LOW (ref 6.4–8.3)

## 2015-02-05 MED ORDER — FAMOTIDINE IN NACL 20-0.9 MG/50ML-% IV SOLN
INTRAVENOUS | Status: AC
  Filled 2015-02-05: qty 50

## 2015-02-05 MED ORDER — SODIUM CHLORIDE 0.9 % IV SOLN
186.2000 mg | Freq: Once | INTRAVENOUS | Status: AC
  Administered 2015-02-05: 190 mg via INTRAVENOUS
  Filled 2015-02-05: qty 19

## 2015-02-05 MED ORDER — SODIUM CHLORIDE 0.9 % IV SOLN
Freq: Once | INTRAVENOUS | Status: AC
  Administered 2015-02-05: 12:00:00 via INTRAVENOUS

## 2015-02-05 MED ORDER — DIPHENHYDRAMINE HCL 50 MG/ML IJ SOLN
50.0000 mg | Freq: Once | INTRAMUSCULAR | Status: AC
  Administered 2015-02-05: 50 mg via INTRAVENOUS

## 2015-02-05 MED ORDER — SODIUM CHLORIDE 0.9 % IV SOLN
Freq: Once | INTRAVENOUS | Status: AC
  Administered 2015-02-05: 12:00:00 via INTRAVENOUS
  Filled 2015-02-05: qty 8

## 2015-02-05 MED ORDER — PACLITAXEL CHEMO INJECTION 300 MG/50ML
45.0000 mg/m2 | Freq: Once | INTRAVENOUS | Status: AC
  Administered 2015-02-05: 96 mg via INTRAVENOUS
  Filled 2015-02-05: qty 16

## 2015-02-05 MED ORDER — DIPHENHYDRAMINE HCL 50 MG/ML IJ SOLN
INTRAMUSCULAR | Status: AC
  Filled 2015-02-05: qty 1

## 2015-02-05 MED ORDER — FAMOTIDINE IN NACL 20-0.9 MG/50ML-% IV SOLN
20.0000 mg | Freq: Once | INTRAVENOUS | Status: AC
Start: 2015-02-05 — End: 2015-02-05
  Administered 2015-02-05: 20 mg via INTRAVENOUS

## 2015-02-05 NOTE — Patient Instructions (Signed)
[facility] Discharge Instructions for Patients Receiving Chemotherapy  Today you received the following chemotherapy agents taxol/carboplatin  To help prevent nausea and vomiting after your treatment, we encourage you to take your nausea medication as directed   If you develop nausea and vomiting that is not controlled by your nausea medication, call the clinic.   BELOW ARE SYMPTOMS THAT SHOULD BE REPORTED IMMEDIATELY:  *FEVER GREATER THAN 100.5 F  *CHILLS WITH OR WITHOUT FEVER  NAUSEA AND VOMITING THAT IS NOT CONTROLLED WITH YOUR NAUSEA MEDICATION  *UNUSUAL SHORTNESS OF BREATH  *UNUSUAL BRUISING OR BLEEDING  TENDERNESS IN MOUTH AND THROAT WITH OR WITHOUT PRESENCE OF ULCERS  *URINARY PROBLEMS  *BOWEL PROBLEMS  UNUSUAL RASH Items with * indicate a potential emergency and should be followed up as soon as possible.  Feel free to call the clinic you have any questions or concerns. The clinic phone number is [phone].  

## 2015-02-06 ENCOUNTER — Ambulatory Visit
Admission: RE | Admit: 2015-02-06 | Discharge: 2015-02-06 | Disposition: A | Discharge status: Home / Self Care | Payer: Medicare Other | Source: Ambulatory Visit | Attending: Radiation Oncology | Admitting: Radiation Oncology

## 2015-02-06 ENCOUNTER — Ambulatory Visit: Admission: RE | Admit: 2015-02-06 | Payer: Medicare Other | Source: Ambulatory Visit | Admitting: Radiation Oncology

## 2015-02-06 DIAGNOSIS — Z510 Encounter for antineoplastic radiation therapy: Secondary | ICD-10-CM | POA: Diagnosis not present

## 2015-02-07 ENCOUNTER — Ambulatory Visit
Admission: RE | Admit: 2015-02-07 | Discharge: 2015-02-07 | Disposition: A | Discharge status: Home / Self Care | Payer: Medicare Other | Source: Ambulatory Visit | Attending: Radiation Oncology | Admitting: Radiation Oncology

## 2015-02-07 ENCOUNTER — Encounter: Payer: Self-pay | Admitting: Radiation Oncology

## 2015-02-07 VITALS — BP 103/59 | HR 92 | Temp 97.6°F | Resp 20 | Ht 70.0 in | Wt 200.1 lb

## 2015-02-07 DIAGNOSIS — Z510 Encounter for antineoplastic radiation therapy: Secondary | ICD-10-CM | POA: Diagnosis not present

## 2015-02-07 DIAGNOSIS — C3491 Malignant neoplasm of unspecified part of right bronchus or lung: Secondary | ICD-10-CM

## 2015-02-07 NOTE — Progress Notes (Signed)
Weekly Management Note Current Dose: 46 Gy  Projected Dose: 60 Gy   Narrative:  The patient presents for routine under treatment assessment.  CBCT/MVCT images/Port film x-rays were reviewed.  The chart was checked. Feeling well. Has had one episode of a bloody nose. Has had some hypertension. He continues to take his Lasix at night. He is eating well and drinking well. He does have about 4 days after chemotherapy that he feels terrible.   Physical Findings:  Appears well. Alert and oriented. Pt was hypotensive upon presentation.  Vitals: There were no vitals filed for this visit. Weight:  Wt Readings from Last 3 Encounters:  01/29/15 195 lb 1.6 oz (88.497 kg)  01/15/15 193 lb 14.4 oz (87.952 kg)  01/09/15 196 lb 11.2 oz (89.223 kg)   Lab Results  Component Value Date   WBC 3.3* 02/05/2015   HGB 9.2* 02/05/2015   HCT 29.4* 02/05/2015   MCV 84.2 02/05/2015   PLT 121* 02/05/2015   Lab Results  Component Value Date   CREATININE 1.0 02/05/2015   BUN 16.7 02/05/2015   NA 140 02/05/2015   K 3.8 02/05/2015   CL 108 11/29/2014   CO2 25 02/05/2015     Impression:  The patient is tolerating radiation.   Plan:  Continue treatment as planned. I reviewed his cone beam CT. We discussed modifying his radiation treatment plan for his last 7 fractions, he prefers to continue his current plan. Advised him to discontinue his Lasix and potassium until he's feeling better from chemotherapy and not having the 4 days of poor po intake.    This document serves as a record of services personally performed by Thea Silversmith, MD. It was created on her behalf by Arlyce Harman, a trained medical scribe. The creation of this record is based on the scribe's personal observations and the provider's statements to them. This document has been checked and approved by the attending provider.

## 2015-02-07 NOTE — Progress Notes (Signed)
Cleave Ternes has completed 23 fractions to his right chest.  He denies pain, sore throat and trouble swallowing.  He reports having a nose bleed this morning for the first time in 50 years.  He does use supplemental oxygen 3 L at home as needed.  He reports an occasional cough with clear sputum.  He denies hemoptysis.  He reports his shortness of breath is worse than when he started radiation.  He reports fatigue.  He had chemotherapy on Monday.  He reports feeling terrible on the weekends after chemo with feeling weak, achy and dizzy.  He has redness on his right upper chest and upper back.  His bp on arrival was low at 94/51 and was 103/59 when retaken.  He is taking Cardizem.  BP 103/59 mmHg  Pulse 92  Temp(Src) 97.6 F (36.4 C) (Oral)  Resp 20  Ht '5\' 10"'$  (1.778 m)  Wt 200 lb 1.6 oz (90.765 kg)  BMI 28.71 kg/m2  SpO2 100%

## 2015-02-08 ENCOUNTER — Ambulatory Visit
Admission: RE | Admit: 2015-02-08 | Discharge: 2015-02-08 | Disposition: A | Discharge status: Home / Self Care | Payer: Medicare Other | Source: Ambulatory Visit | Attending: Radiation Oncology | Admitting: Radiation Oncology

## 2015-02-08 DIAGNOSIS — Z510 Encounter for antineoplastic radiation therapy: Secondary | ICD-10-CM | POA: Diagnosis not present

## 2015-02-09 ENCOUNTER — Ambulatory Visit
Admission: RE | Admit: 2015-02-09 | Discharge: 2015-02-09 | Disposition: A | Discharge status: Home / Self Care | Payer: Medicare Other | Source: Ambulatory Visit | Attending: Radiation Oncology | Admitting: Radiation Oncology

## 2015-02-09 DIAGNOSIS — Z510 Encounter for antineoplastic radiation therapy: Secondary | ICD-10-CM | POA: Diagnosis not present

## 2015-02-12 ENCOUNTER — Ambulatory Visit
Admission: RE | Admit: 2015-02-12 | Discharge: 2015-02-12 | Disposition: A | Discharge status: Home / Self Care | Payer: Medicare Other | Source: Ambulatory Visit | Attending: Radiation Oncology | Admitting: Radiation Oncology

## 2015-02-12 ENCOUNTER — Ambulatory Visit (HOSPITAL_BASED_OUTPATIENT_CLINIC_OR_DEPARTMENT_OTHER): Payer: Medicare Other | Admitting: Oncology

## 2015-02-12 ENCOUNTER — Other Ambulatory Visit (HOSPITAL_BASED_OUTPATIENT_CLINIC_OR_DEPARTMENT_OTHER): Payer: Medicare Other

## 2015-02-12 ENCOUNTER — Encounter: Payer: Self-pay | Admitting: Oncology

## 2015-02-12 ENCOUNTER — Ambulatory Visit (HOSPITAL_BASED_OUTPATIENT_CLINIC_OR_DEPARTMENT_OTHER): Payer: Medicare Other

## 2015-02-12 ENCOUNTER — Telehealth: Payer: Self-pay | Admitting: Oncology

## 2015-02-12 VITALS — BP 105/58 | HR 88 | Temp 97.8°F | Resp 16 | Wt 194.5 lb

## 2015-02-12 DIAGNOSIS — C3491 Malignant neoplasm of unspecified part of right bronchus or lung: Secondary | ICD-10-CM

## 2015-02-12 DIAGNOSIS — C3411 Malignant neoplasm of upper lobe, right bronchus or lung: Secondary | ICD-10-CM

## 2015-02-12 DIAGNOSIS — Z5111 Encounter for antineoplastic chemotherapy: Secondary | ICD-10-CM | POA: Diagnosis not present

## 2015-02-12 DIAGNOSIS — Z510 Encounter for antineoplastic radiation therapy: Secondary | ICD-10-CM | POA: Diagnosis not present

## 2015-02-12 DIAGNOSIS — C3490 Malignant neoplasm of unspecified part of unspecified bronchus or lung: Secondary | ICD-10-CM

## 2015-02-12 LAB — CBC WITH DIFFERENTIAL/PLATELET
BASO%: 0.2 % (ref 0.0–2.0)
Basophils Absolute: 0 10*3/uL (ref 0.0–0.1)
EOS%: 0.5 % (ref 0.0–7.0)
Eosinophils Absolute: 0 10*3/uL (ref 0.0–0.5)
HCT: 29.4 % — ABNORMAL LOW (ref 38.4–49.9)
HGB: 8.9 g/dL — ABNORMAL LOW (ref 13.0–17.1)
LYMPH%: 6.9 % — ABNORMAL LOW (ref 14.0–49.0)
MCH: 25.9 pg — AB (ref 27.2–33.4)
MCHC: 30.3 g/dL — AB (ref 32.0–36.0)
MCV: 85.5 fL (ref 79.3–98.0)
MONO#: 0.4 10*3/uL (ref 0.1–0.9)
MONO%: 8.6 % (ref 0.0–14.0)
NEUT#: 3.4 10*3/uL (ref 1.5–6.5)
NEUT%: 83.8 % — AB (ref 39.0–75.0)
NRBC: 0 % (ref 0–0)
Platelets: 130 10*3/uL — ABNORMAL LOW (ref 140–400)
RBC: 3.44 10*6/uL — ABNORMAL LOW (ref 4.20–5.82)
RDW: 20.1 % — ABNORMAL HIGH (ref 11.0–14.6)
WBC: 4.1 10*3/uL (ref 4.0–10.3)
lymph#: 0.3 10*3/uL — ABNORMAL LOW (ref 0.9–3.3)

## 2015-02-12 LAB — COMPREHENSIVE METABOLIC PANEL (CC13)
ALBUMIN: 3 g/dL — AB (ref 3.5–5.0)
ALT: 13 U/L (ref 0–55)
ANION GAP: 14 meq/L — AB (ref 3–11)
AST: 12 U/L (ref 5–34)
Alkaline Phosphatase: 86 U/L (ref 40–150)
BUN: 20.7 mg/dL (ref 7.0–26.0)
CALCIUM: 9 mg/dL (ref 8.4–10.4)
CHLORIDE: 107 meq/L (ref 98–109)
CO2: 22 meq/L (ref 22–29)
CREATININE: 1.2 mg/dL (ref 0.7–1.3)
EGFR: 56 mL/min/{1.73_m2} — AB (ref >90)
GLUCOSE: 125 mg/dL (ref 70–140)
POTASSIUM: 3.5 meq/L (ref 3.5–5.1)
Sodium: 143 mEq/L (ref 136–145)
Total Bilirubin: 0.34 mg/dL (ref 0.20–1.20)
Total Protein: 6.1 g/dL — ABNORMAL LOW (ref 6.4–8.3)

## 2015-02-12 MED ORDER — SODIUM CHLORIDE 0.9 % IV SOLN
Freq: Once | INTRAVENOUS | Status: AC
  Administered 2015-02-12: 13:00:00 via INTRAVENOUS
  Filled 2015-02-12: qty 8

## 2015-02-12 MED ORDER — DIPHENHYDRAMINE HCL 50 MG/ML IJ SOLN
INTRAMUSCULAR | Status: AC
  Filled 2015-02-12: qty 1

## 2015-02-12 MED ORDER — DIPHENHYDRAMINE HCL 50 MG/ML IJ SOLN
50.0000 mg | Freq: Once | INTRAMUSCULAR | Status: AC
  Administered 2015-02-12: 50 mg via INTRAVENOUS

## 2015-02-12 MED ORDER — FAMOTIDINE IN NACL 20-0.9 MG/50ML-% IV SOLN
20.0000 mg | Freq: Once | INTRAVENOUS | Status: AC
  Administered 2015-02-12: 20 mg via INTRAVENOUS

## 2015-02-12 MED ORDER — SODIUM CHLORIDE 0.9 % IV SOLN
Freq: Once | INTRAVENOUS | Status: AC
  Administered 2015-02-12: 13:00:00 via INTRAVENOUS

## 2015-02-12 MED ORDER — DEXTROSE 5 % IV SOLN
45.0000 mg/m2 | Freq: Once | INTRAVENOUS | Status: AC
  Administered 2015-02-12: 96 mg via INTRAVENOUS
  Filled 2015-02-12: qty 16

## 2015-02-12 MED ORDER — SODIUM CHLORIDE 0.9 % IV SOLN
186.2000 mg | Freq: Once | INTRAVENOUS | Status: AC
  Administered 2015-02-12: 190 mg via INTRAVENOUS
  Filled 2015-02-12: qty 19

## 2015-02-12 MED ORDER — FAMOTIDINE IN NACL 20-0.9 MG/50ML-% IV SOLN
INTRAVENOUS | Status: AC
  Filled 2015-02-12: qty 50

## 2015-02-12 NOTE — Progress Notes (Signed)
[place] [phone]   [phone]  OFFICE PROGRESS NOTE  BURNETT,BRENT A, MD [address] [place] [address]  DIAGNOSIS: Stage IIIA (T3, N1, M0) non-small cell lung cancer, squamous cell carcinoma with positive PDL 1 expression, presented with large right upper lobe lung mass with hilar lymphadenopathy diagnosed in February 2016.  PRIOR THERAPY: None  CURRENT THERAPY: Concurrent chemoradiation with weekly carboplatin for AUC of 2 and paclitaxel 45 MG/M2. First dose 01/10/2015.  INTERVAL HISTORY: [name] 81 y.o. male returns to the clinic today for follow-up visit accompanied by his granddaughter. He complains of increased fatigue. The patient has been tolerating his chemotherapy well. Denies significant chest pain and shortness of breath at rest. Denies cough, fever, and chills. The patient denied having any significant weight loss or night sweats. He has no nausea or vomiting. He is scheduled to complete his radiation on 02/16/2015.   MEDICAL HISTORY: Past Medical History  Diagnosis Date  . Syncope   . COPD (chronic obstructive pulmonary disease)   . Chronic atrial fibrillation   . GERD (gastroesophageal reflux disease)   . Coronary artery disease   . Hypertension   . Kidney stone   . CAD (coronary artery disease), native coronary artery 12/03/2014  . Thoracic ascending aortic aneurysm 12/03/2014    4.2 x 4.3 cm noted on CT scan February 2016   . SCC (squamous cell carcinoma of lung)     ALLERGIES:  is allergic to codeine.  MEDICATIONS:  Current Outpatient Prescriptions  Medication Sig Dispense Refill  . albuterol (PROVENTIL HFA;VENTOLIN HFA) 108 (90 BASE) MCG/ACT inhaler Inhale 1-2 puffs into the lungs every 6 (six) hours as needed for wheezing or shortness of breath.    . budesonide-formoterol (SYMBICORT) 160-4.5 MCG/ACT inhaler Inhale 2 puffs into the lungs 2 (two) times daily.    Marland Kitchen diltiazem (CARDIZEM)  90 MG tablet Take 90 mg by mouth 2 (two) times daily. Take two tablets in the morning and in the afternoon    . diphenhydramine-acetaminophen (TYLENOL PM) 25-500 MG TABS Take 2 tablets by mouth at bedtime as needed.    . furosemide (LASIX) 40 MG tablet Take 0.5 tablets (20 mg total) by mouth daily.    . hyaluronate sodium (RADIAPLEXRX) GEL Apply 1 application topically once.    Marland Kitchen levofloxacin (LEVAQUIN) 500 MG tablet   0  . pantoprazole (PROTONIX) 40 MG tablet Take 1 tablet (40 mg total) by mouth 2 (two) times daily. 60 tablet 1  . prochlorperazine (COMPAZINE) 10 MG tablet Take 1 tablet (10 mg total) by mouth every 6 (six) hours as needed for nausea or vomiting. 30 tablet 0  . rivaroxaban (XARELTO) 20 MG TABS tablet Take 20 mg by mouth daily with supper.    . sucralfate (CARAFATE) 1 GM/10ML suspension Take 10 mLs (1 g total) by mouth 4 (four) times daily -  with meals and at bedtime. 420 mL 0  . zolpidem (AMBIEN) 10 MG tablet Take 5 mg by mouth at bedtime as needed for sleep.     No current facility-administered medications for this visit.   Facility-Administered Medications Ordered in Other Visits  Medication Dose Route Frequency Provider Last Rate Last Dose  . CARBOplatin (PARAPLATIN) 190 mg in sodium chloride 0.9 % 100 mL chemo infusion  190 mg Intravenous Once Curt Bears, MD      . famotidine (PEPCID) IVPB 20 mg premix  20 mg Intravenous Once Curt Bears, MD  20 mg at 02/12/15 1255  . ondansetron (ZOFRAN) 16 mg, dexamethasone (DECADRON) 20 mg in sodium chloride 0.9 % 50 mL IVPB   Intravenous Once Curt Bears, MD      . PACLitaxel (TAXOL) 96 mg in dextrose 5 % 250 mL chemo infusion (</= '80mg'$ /m2)  45 mg/m2 (Treatment Plan Actual) Intravenous Once Curt Bears, MD        SURGICAL HISTORY:  Past Surgical History  Procedure Laterality Date  . Appendectomy    . Cholecystectomy    . Esophageal dilation  2012  . Video bronchoscopy Bilateral 11/29/2014    Procedure: VIDEO  BRONCHOSCOPY WITHOUT FLUORO;  Surgeon: Collene Gobble, MD;  Location: [facility];  Service: Cardiopulmonary;  Laterality: Bilateral;  . Esophagogastroduodenoscopy N/A 11/30/2014    Procedure: ESOPHAGOGASTRODUODENOSCOPY (EGD);  Surgeon: Arta Silence, MD;  Location[facility];  Service: Endoscopy;  Laterality: N/A;    REVIEW OF SYSTEMS:  Constitutional: positive for fatigue, malaise and increased Eyes: negative Ears, nose, mouth, throat, and face: negative Respiratory: positive for dyspnea on exertion Cardiovascular: negative Gastrointestinal: negative Genitourinary:negative Integument/breast: negative Hematologic/lymphatic: negative Musculoskeletal:negative Neurological: negative Behavioral/Psych: negative Endocrine: negative Allergic/Immunologic: negative   PHYSICAL EXAMINATION: General appearance: alert, cooperative, fatigued and no distress Head: Normocephalic, without obvious abnormality, atraumatic Neck: no adenopathy, no JVD, supple, symmetrical, trachea midline and thyroid not enlarged, symmetric, no tenderness/mass/nodules Lymph nodes: Cervical, supraclavicular, and axillary nodes normal. Resp: clear to auscultation bilaterally Back: symmetric, no curvature. ROM normal. No CVA tenderness. Cardio: regular rate and rhythm, S1, S2 normal, no murmur, click, rub or gallop GI: soft, non-tender; bowel sounds normal; no masses,  no organomegaly Extremities: extremities normal, atraumatic, no cyanosis or edema Neurologic: Alert and oriented X 3, normal strength and tone. Normal symmetric reflexes. Normal coordination and gait  ECOG PERFORMANCE STATUS: 1 - Symptomatic but completely ambulatory  Blood pressure 105/58, pulse 88, temperature 97.8 F (36.6 C), temperature source Oral, resp. rate 16, weight 194 lb 8 oz (88.225 kg).  LABORATORY DATA: Lab Results  Component Value Date   WBC 4.1 02/12/2015   HGB 8.9* 02/12/2015   HCT 29.4* 02/12/2015   MCV 85.5 02/12/2015   PLT  130* 02/12/2015      Chemistry      Component Value Date/Time   NA 143 02/12/2015 1114   NA 138 11/29/2014 0340   K 3.5 02/12/2015 1114   K 3.7 11/29/2014 0340   CL 108 11/29/2014 0340   CO2 22 02/12/2015 1114   CO2 23 11/29/2014 0340   BUN 20.7 02/12/2015 1114   BUN 12 11/29/2014 0340   CREATININE 1.2 02/12/2015 1114   CREATININE 1.02 11/29/2014 0340      Component Value Date/Time   CALCIUM 9.0 02/12/2015 1114   CALCIUM 8.6 11/29/2014 0340   ALKPHOS 86 02/12/2015 1114   ALKPHOS 73 04/09/2011 0419   AST 12 02/12/2015 1114   AST 42* 04/09/2011 0419   ALT 13 02/12/2015 1114   ALT 52 04/09/2011 0419   BILITOT 0.34 02/12/2015 1114   BILITOT 0.7 04/09/2011 0419       RADIOGRAPHIC STUDIES: No results found.  ASSESSMENT AND PLAN: This is a very pleasant 81 year old white male recently diagnosed with a stage IIIa non-small cell lung cancer, squamous cell carcinoma. The recent MRI of the brain showed no evidence for metastatic disease to the brain and the PET scan showed no evidence for metastatic disease outside the chest. He is currently receiving concurrent chemoradiation with weekly carboplatin for AUC of 2  and paclitaxel 45 MG/M2 for a total of 6-7 weeks.  The patient was seen and examined with Dr. Julien Nordmann. He is tolerating his treatment well overall. He will proceed with his chemotherapy today. He is scheduled to complete his radiation at the end of this week. He will have a restaging CT scan of the chest in approximately 5 weeks with a follow-up visit thereafter.  The patient was advised to call immediately if he has any concerning symptoms in the interval. The patient voices understanding of current disease status and treatment options and is in agreement with the current care plan.  All questions were answered. The patient knows to call the clinic with any problems, questions or concerns. We can certainly see the patient much sooner if necessary.  Mikey Bussing, DNP,  AGPCNP-BC, AOCNP 02/12/2015    ADDENDUM: Hematology/Oncology Attending: I had a face to face encounter with the patient today. I recommended his care plan. This is a very pleasant 81 years old white male with a stage IIIa non-small cell lung cancer, squamous cell carcinoma who is currently undergoing a course of concurrent chemoradiation with weekly carboplatin and paclitaxel status post 6 weeks of treatment. He is tolerating the treatment fairly well except for mild nausea and fatigue. He is expected to complete the course of radiotherapy later this week. I recommended for the patient to proceed with his weekly chemotherapy today as a scheduled. He would come back for follow-up visit in around 5 weeks for reevaluation with repeat CT scan of the chest for restaging of his disease. He was advised to call immediately if he has any concerning symptoms in the interval.  Disclaimer: This note was dictated with voice recognition software. Similar sounding words can inadvertently be transcribed and may not be corrected upon review. Eilleen Kempf., MD 02/12/2015

## 2015-02-12 NOTE — Patient Instructions (Signed)
[facility] Discharge Instructions for Patients Receiving Chemotherapy  Today you received the following chemotherapy agents Taxol and Carboplatin  To help prevent nausea and vomiting after your treatment, we encourage you to take your nausea medication Compazine 10 mg every 6 hours as needed.   If you develop nausea and vomiting that is not controlled by your nausea medication, call the clinic.   BELOW ARE SYMPTOMS THAT SHOULD BE REPORTED IMMEDIATELY:  *FEVER GREATER THAN 100.5 F  *CHILLS WITH OR WITHOUT FEVER  NAUSEA AND VOMITING THAT IS NOT CONTROLLED WITH YOUR NAUSEA MEDICATION  *UNUSUAL SHORTNESS OF BREATH  *UNUSUAL BRUISING OR BLEEDING  TENDERNESS IN MOUTH AND THROAT WITH OR WITHOUT PRESENCE OF ULCERS  *URINARY PROBLEMS  *BOWEL PROBLEMS  UNUSUAL RASH Items with * indicate a potential emergency and should be followed up as soon as possible.  Feel free to call the clinic you have any questions or concerns. The clinic phone number is [phone].  Please show the CHEMO ALERT CARD at check-in to the Emergency Department and triage nurse.   

## 2015-02-12 NOTE — Telephone Encounter (Signed)
per pof to sch pt appt-gave pt copy of sch °

## 2015-02-13 ENCOUNTER — Ambulatory Visit: Admission: RE | Admit: 2015-02-13 | Payer: Medicare Other | Source: Ambulatory Visit | Admitting: Radiation Oncology

## 2015-02-13 ENCOUNTER — Ambulatory Visit
Admission: RE | Admit: 2015-02-13 | Discharge: 2015-02-13 | Disposition: A | Discharge status: Home / Self Care | Payer: Medicare Other | Source: Ambulatory Visit | Attending: Radiation Oncology | Admitting: Radiation Oncology

## 2015-02-13 DIAGNOSIS — Z510 Encounter for antineoplastic radiation therapy: Secondary | ICD-10-CM | POA: Diagnosis not present

## 2015-02-14 ENCOUNTER — Ambulatory Visit
Admission: RE | Admit: 2015-02-14 | Discharge: 2015-02-14 | Disposition: A | Discharge status: Home / Self Care | Payer: Medicare Other | Source: Ambulatory Visit | Attending: Radiation Oncology | Admitting: Radiation Oncology

## 2015-02-14 ENCOUNTER — Ambulatory Visit: Payer: Medicare Other

## 2015-02-14 DIAGNOSIS — Z510 Encounter for antineoplastic radiation therapy: Secondary | ICD-10-CM | POA: Diagnosis not present

## 2015-02-15 ENCOUNTER — Inpatient Hospital Stay
Admission: RE | Admit: 2015-02-15 | Discharge: 2015-02-15 | Disposition: A | Discharge status: Home / Self Care | Payer: Self-pay | Source: Ambulatory Visit | Attending: Radiation Oncology | Admitting: Radiation Oncology

## 2015-02-15 ENCOUNTER — Ambulatory Visit: Payer: Medicare Other

## 2015-02-15 ENCOUNTER — Ambulatory Visit
Admission: RE | Admit: 2015-02-15 | Discharge: 2015-02-15 | Disposition: A | Discharge status: Home / Self Care | Payer: Medicare Other | Source: Ambulatory Visit | Attending: Radiation Oncology | Admitting: Radiation Oncology

## 2015-02-15 ENCOUNTER — Telehealth: Payer: Self-pay | Admitting: Oncology

## 2015-02-15 DIAGNOSIS — Z510 Encounter for antineoplastic radiation therapy: Secondary | ICD-10-CM | POA: Diagnosis not present

## 2015-02-15 NOTE — Telephone Encounter (Signed)
Left a message for [name] regarding his missed under treat appointment today.  Requested a return call.

## 2015-02-16 ENCOUNTER — Ambulatory Visit
Admission: RE | Admit: 2015-02-16 | Discharge: 2015-02-16 | Disposition: A | Discharge status: Home / Self Care | Payer: Medicare Other | Source: Ambulatory Visit | Attending: Radiation Oncology | Admitting: Radiation Oncology

## 2015-02-16 ENCOUNTER — Ambulatory Visit: Payer: Medicare Other

## 2015-02-16 ENCOUNTER — Encounter: Payer: Self-pay | Admitting: Radiation Oncology

## 2015-02-16 VITALS — BP 104/68 | HR 92 | Temp 97.8°F | Resp 24 | Ht 70.0 in | Wt 195.4 lb

## 2015-02-16 DIAGNOSIS — Z510 Encounter for antineoplastic radiation therapy: Secondary | ICD-10-CM | POA: Diagnosis not present

## 2015-02-16 DIAGNOSIS — C3491 Malignant neoplasm of unspecified part of right bronchus or lung: Secondary | ICD-10-CM

## 2015-02-16 NOTE — Progress Notes (Signed)
[name] has completed treatment with 30 fractions to his right chest.  He denies pain.  He reports that he feels weak and short of breath today.  He had chemotherapy on Monday and says he always feels like this on the 3rd day.  He reports coughing every once in a while.  He denies hemoptysis.  He has been having nose bleeds once a week.  He does take Xarelto.  He uses 3 L of oxygen as needed at home.  He reports he has not had to use it in the past 2 weeks.  He reports his throat is sore.  He is not taking Carafate.  The skin on his right upper back is red.  He is using radiaplex gel.  He has been given a one month follow up card.  BP 104/68 mmHg  Pulse 92  Temp(Src) 97.8 F (36.6 C) (Oral)  Resp 24  Ht '5\' 10"'$  (1.778 m)  Wt 195 lb 6.4 oz (88.633 kg)  BMI 28.04 kg/m2  SpO2 100%

## 2015-02-16 NOTE — Progress Notes (Signed)
Department of Radiation Oncology  Phone:  [phone] Fax:        [phone]  Weekly Treatment Note    Name: [name] Date: 02/16/2015 MRN: [id number] DOB: 12-Jul-1933   Current dose: 60 Gy  Current fraction:30   MEDICATIONS: Current Outpatient Prescriptions  Medication Sig Dispense Refill  . albuterol (PROVENTIL HFA;VENTOLIN HFA) 108 (90 BASE) MCG/ACT inhaler Inhale 1-2 puffs into the lungs every 6 (six) hours as needed for wheezing or shortness of breath.    . budesonide-formoterol (SYMBICORT) 160-4.5 MCG/ACT inhaler Inhale 2 puffs into the lungs 2 (two) times daily.    Marland Kitchen diltiazem (CARDIZEM) 90 MG tablet Take 90 mg by mouth 2 (two) times daily. Take two tablets in the morning and in the afternoon    . furosemide (LASIX) 40 MG tablet Take 0.5 tablets (20 mg total) by mouth daily.    . hyaluronate sodium (RADIAPLEXRX) GEL Apply 1 application topically once.    . rivaroxaban (XARELTO) 20 MG TABS tablet Take 20 mg by mouth daily with supper.    . zolpidem (AMBIEN) 10 MG tablet Take 5 mg by mouth at bedtime as needed for sleep.    . diphenhydramine-acetaminophen (TYLENOL PM) 25-500 MG TABS Take 2 tablets by mouth at bedtime as needed.    Marland Kitchen levofloxacin (LEVAQUIN) 500 MG tablet   0  . pantoprazole (PROTONIX) 40 MG tablet Take 1 tablet (40 mg total) by mouth 2 (two) times daily. (Patient not taking: Reported on 02/16/2015) 60 tablet 1  . prochlorperazine (COMPAZINE) 10 MG tablet Take 1 tablet (10 mg total) by mouth every 6 (six) hours as needed for nausea or vomiting. (Patient not taking: Reported on 02/16/2015) 30 tablet 0  . sucralfate (CARAFATE) 1 GM/10ML suspension Take 10 mLs (1 g total) by mouth 4 (four) times daily -  with meals and at bedtime. (Patient not taking: Reported on 02/16/2015) 420 mL 0   No current facility-administered medications for this encounter.     ALLERGIES: Codeine   LABORATORY DATA:  Lab Results  Component Value Date   WBC 4.1 02/12/2015     HGB 8.9* 02/12/2015   HCT 29.4* 02/12/2015   MCV 85.5 02/12/2015   PLT 130* 02/12/2015   Lab Results  Component Value Date   NA 143 02/12/2015   K 3.5 02/12/2015   CL 108 11/29/2014   CO2 22 02/12/2015   Lab Results  Component Value Date   ALT 13 02/12/2015   AST 12 02/12/2015   ALKPHOS 86 02/12/2015   BILITOT 0.34 02/12/2015     NARRATIVE: [name] was seen today for weekly treatment management. The chart was checked and the patient's films were reviewed.  [name] has completed treatment with 30 fractions to his right chest. He denies pain. He reports that he feels weak and short of breath today. He had chemotherapy on Monday and says he always feels like this on the 3rd day. He reports coughing every once in a while. He denies hemoptysis. He has been having nose bleeds once a week. He does take Xarelto. He uses 3 L of oxygen as needed at home. He reports he has not had to use it in the past 2 weeks. He reports his throat is sore. He is not taking Carafate. The skin on his right upper back is red. He is using radiaplex gel. He has been given a one month follow up card.   PHYSICAL EXAMINATION: height is '5\' 10"'$  (1.778 m) and weight  is 195 lb 6.4 oz (88.633 kg). His oral temperature is 97.8 F (36.6 C). His blood pressure is 104/68 and his pulse is 92. His respiration is 24 and oxygen saturation is 100%.       Skin Exam: Redness   ASSESSMENT: The patient is doing satisfactorily with treatment.  PLAN: We will continue with the patient's radiation treatment as planned.        This document serves as a record of services personally performed by Kyung Rudd, MD. It was created on his behalf by Derek Mound, a trained medical scribe. The creation of this record is based on the scribe's personal observations and the provider's statements to them. This document has been checked and approved by the attending provider.

## 2015-02-18 NOTE — Progress Notes (Signed)
  Radiation Oncology         [phone] ________________________________  Name: [name] MRN: [id number]  Date: 02/16/2015  DOB: 1933/01/17  End of Treatment Note   ICD-9-CM ICD-10-CM    1. SCC (squamous cell carcinoma of lung), right 162.9 C34.91     DIAGNOSIS: Stage IIIA (T3, N1, M0) non-small cell lung cancer, squamous cell carcinoma, presenting with a large right upper lobe lung mass with hilar lymphadenopathy diagnosed in February 2016.      Indication for treatment:  Definitive/curative along with chemotherapy       Radiation treatment dates:   01/04/2015-02/16/2015  Site/dose:   Right upper chest/hilar region, 60 Gy in 30 fxs   Beams/energy:   3-D conformal, 4 fields  Narrative: The patient tolerated radiation treatment relatively well.   Some fatigue, minimal  eso. sys  Plan: The patient has completed radiation treatment. The patient will return to radiation oncology clinic for routine followup in one month. I advised them to call or return sooner if they have any questions or concerns related to their recovery or treatment.  -----------------------------------  Blair Promise, PhD, MD

## 2015-02-19 ENCOUNTER — Ambulatory Visit: Payer: Medicare Other

## 2015-02-19 ENCOUNTER — Ambulatory Visit (HOSPITAL_COMMUNITY): Payer: Medicare Other

## 2015-02-19 ENCOUNTER — Other Ambulatory Visit: Payer: Medicare Other

## 2015-03-19 ENCOUNTER — Encounter (HOSPITAL_COMMUNITY): Payer: Self-pay

## 2015-03-19 ENCOUNTER — Ambulatory Visit (HOSPITAL_COMMUNITY)
Admission: RE | Admit: 2015-03-19 | Discharge: 2015-03-19 | Disposition: A | Discharge status: Home / Self Care | Payer: Medicare Other | Source: Ambulatory Visit | Attending: Oncology | Admitting: Oncology

## 2015-03-19 DIAGNOSIS — C3491 Malignant neoplasm of unspecified part of right bronchus or lung: Secondary | ICD-10-CM | POA: Insufficient documentation

## 2015-03-19 DIAGNOSIS — Z87891 Personal history of nicotine dependence: Secondary | ICD-10-CM | POA: Diagnosis not present

## 2015-03-19 MED ORDER — IOHEXOL 300 MG/ML  SOLN
100.0000 mL | Freq: Once | INTRAMUSCULAR | Status: AC | PRN
  Administered 2015-03-19: 80 mL via INTRAVENOUS

## 2015-03-23 ENCOUNTER — Encounter: Payer: Self-pay | Admitting: Oncology

## 2015-03-26 ENCOUNTER — Encounter: Payer: Self-pay | Admitting: Internal Medicine

## 2015-03-26 ENCOUNTER — Encounter: Payer: Self-pay | Admitting: *Deleted

## 2015-03-26 ENCOUNTER — Other Ambulatory Visit (HOSPITAL_BASED_OUTPATIENT_CLINIC_OR_DEPARTMENT_OTHER): Payer: Medicare Other

## 2015-03-26 ENCOUNTER — Ambulatory Visit (HOSPITAL_BASED_OUTPATIENT_CLINIC_OR_DEPARTMENT_OTHER): Payer: Medicare Other | Admitting: Internal Medicine

## 2015-03-26 ENCOUNTER — Telehealth: Payer: Self-pay | Admitting: Internal Medicine

## 2015-03-26 VITALS — BP 102/62 | HR 79 | Temp 97.7°F | Resp 18 | Ht 70.0 in | Wt 195.2 lb

## 2015-03-26 DIAGNOSIS — C3490 Malignant neoplasm of unspecified part of unspecified bronchus or lung: Secondary | ICD-10-CM

## 2015-03-26 DIAGNOSIS — C3411 Malignant neoplasm of upper lobe, right bronchus or lung: Secondary | ICD-10-CM

## 2015-03-26 DIAGNOSIS — C3491 Malignant neoplasm of unspecified part of right bronchus or lung: Secondary | ICD-10-CM

## 2015-03-26 LAB — CBC WITH DIFFERENTIAL/PLATELET
BASO%: 0 % (ref 0.0–2.0)
BASOS ABS: 0 10*3/uL (ref 0.0–0.1)
EOS%: 0.1 % (ref 0.0–7.0)
Eosinophils Absolute: 0 10*3/uL (ref 0.0–0.5)
HCT: 33.3 % — ABNORMAL LOW (ref 38.4–49.9)
HEMOGLOBIN: 10.3 g/dL — AB (ref 13.0–17.1)
LYMPH%: 4.6 % — ABNORMAL LOW (ref 14.0–49.0)
MCH: 28.2 pg (ref 27.2–33.4)
MCHC: 30.9 g/dL — ABNORMAL LOW (ref 32.0–36.0)
MCV: 91.2 fL (ref 79.3–98.0)
MONO#: 1 10*3/uL — AB (ref 0.1–0.9)
MONO%: 10.9 % (ref 0.0–14.0)
NEUT%: 84.4 % — ABNORMAL HIGH (ref 39.0–75.0)
NEUTROS ABS: 7.9 10*3/uL — AB (ref 1.5–6.5)
Platelets: 202 10*3/uL (ref 140–400)
RBC: 3.65 10*6/uL — AB (ref 4.20–5.82)
RDW: 17.8 % — AB (ref 11.0–14.6)
WBC: 9.3 10*3/uL (ref 4.0–10.3)
lymph#: 0.4 10*3/uL — ABNORMAL LOW (ref 0.9–3.3)

## 2015-03-26 LAB — COMPREHENSIVE METABOLIC PANEL (CC13)
ALBUMIN: 3.2 g/dL — AB (ref 3.5–5.0)
ALT: 21 U/L (ref 0–55)
ANION GAP: 8 meq/L (ref 3–11)
AST: 15 U/L (ref 5–34)
Alkaline Phosphatase: 91 U/L (ref 40–150)
BUN: 27.1 mg/dL — AB (ref 7.0–26.0)
CHLORIDE: 107 meq/L (ref 98–109)
CO2: 27 mEq/L (ref 22–29)
Calcium: 9.5 mg/dL (ref 8.4–10.4)
Creatinine: 1.1 mg/dL (ref 0.7–1.3)
EGFR: 63 mL/min/{1.73_m2} — AB (ref >90)
GLUCOSE: 98 mg/dL (ref 70–140)
POTASSIUM: 3.9 meq/L (ref 3.5–5.1)
SODIUM: 142 meq/L (ref 136–145)
Total Bilirubin: 0.37 mg/dL (ref 0.20–1.20)
Total Protein: 6.6 g/dL (ref 6.4–8.3)

## 2015-03-26 NOTE — Progress Notes (Unsigned)
Oncology Nurse Navigator Documentation  Oncology Nurse Navigator Flowsheets 03/26/2015  Navigator Encounter Type 3 month  Patient Visit Type Follow-up/Spoke with patient and family today during office visit with Dr. Julien Nordmann.  Patient was in good spirits today and making jokes.  Patient and Dr. Julien Nordmann agreed on observation for now and follow up in 3 months with CT Chest scan.    Treatment Phase Other/post 6 weeks concurrent chemo radiation therapy    Barriers/Navigation Needs No barriers at this time  Support Groups/Services Friends and Family  Time Spent with Patient 30

## 2015-03-26 NOTE — Telephone Encounter (Signed)
Gave avs & calendar for September °
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
## 2015-03-28 ENCOUNTER — Ambulatory Visit
Admission: RE | Admit: 2015-03-28 | Discharge: 2015-03-28 | Disposition: A | Discharge status: Home / Self Care | Payer: Medicare Other | Source: Ambulatory Visit | Attending: Radiation Oncology | Admitting: Radiation Oncology

## 2015-03-28 ENCOUNTER — Encounter: Payer: Self-pay | Admitting: Radiation Oncology

## 2015-03-28 VITALS — BP 135/86 | HR 76 | Temp 98.1°F | Resp 20 | Ht 70.0 in | Wt 196.8 lb

## 2015-03-28 DIAGNOSIS — C3491 Malignant neoplasm of unspecified part of right bronchus or lung: Secondary | ICD-10-CM

## 2015-03-28 NOTE — Progress Notes (Addendum)
[name] here for follow up.  He denies pain.  He reports he had severe itching on his right upper back 1 weeks after radiation.  He was given kenalog cream and hydroxyzine 10 mg.  The area is healed and dry.  He reports shortness of breath with acitivty and especially when bending over.  He denies coughing and hemoptysis.  He denies a sore throat and trouble swallowing.  He reports food is getting stuck in his throat and says he will need his esophagus stretched again.  He reports his energy level is improving and he is walking more.  He said he has home oxygen through [place] and would like to have it discontinued.  BP 135/86 mmHg  Pulse 76  Temp(Src) 98.1 F (36.7 C) (Oral)  Resp 20  Ht '5\' 10"'$  (1.778 m)  Wt 196 lb 12.8 oz (89.268 kg)  BMI 28.24 kg/m2  SpO2 100%
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
## 2015-04-02 ENCOUNTER — Encounter: Payer: Self-pay | Admitting: Cardiology
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
## 2015-05-15 ENCOUNTER — Inpatient Hospital Stay (HOSPITAL_COMMUNITY)
Admission: EM | Admit: 2015-05-15 | Discharge: 2015-05-18 | DRG: 194 | Disposition: A | Discharge status: Home Health | Payer: Medicare Other | Attending: Internal Medicine | Admitting: Internal Medicine

## 2015-05-15 ENCOUNTER — Encounter (HOSPITAL_COMMUNITY): Payer: Self-pay | Admitting: Physical Medicine and Rehabilitation

## 2015-05-15 ENCOUNTER — Emergency Department (HOSPITAL_COMMUNITY): Payer: Medicare Other

## 2015-05-15 DIAGNOSIS — Z87891 Personal history of nicotine dependence: Secondary | ICD-10-CM

## 2015-05-15 DIAGNOSIS — Z801 Family history of malignant neoplasm of trachea, bronchus and lung: Secondary | ICD-10-CM

## 2015-05-15 DIAGNOSIS — Z7901 Long term (current) use of anticoagulants: Secondary | ICD-10-CM

## 2015-05-15 DIAGNOSIS — J449 Chronic obstructive pulmonary disease, unspecified: Secondary | ICD-10-CM | POA: Diagnosis present

## 2015-05-15 DIAGNOSIS — J189 Pneumonia, unspecified organism: Principal | ICD-10-CM | POA: Diagnosis present

## 2015-05-15 DIAGNOSIS — Z8249 Family history of ischemic heart disease and other diseases of the circulatory system: Secondary | ICD-10-CM | POA: Diagnosis not present

## 2015-05-15 DIAGNOSIS — Z9049 Acquired absence of other specified parts of digestive tract: Secondary | ICD-10-CM | POA: Diagnosis present

## 2015-05-15 DIAGNOSIS — Z885 Allergy status to narcotic agent status: Secondary | ICD-10-CM

## 2015-05-15 DIAGNOSIS — Z7982 Long term (current) use of aspirin: Secondary | ICD-10-CM | POA: Diagnosis not present

## 2015-05-15 DIAGNOSIS — I712 Thoracic aortic aneurysm, without rupture: Secondary | ICD-10-CM | POA: Diagnosis present

## 2015-05-15 DIAGNOSIS — K219 Gastro-esophageal reflux disease without esophagitis: Secondary | ICD-10-CM | POA: Diagnosis present

## 2015-05-15 DIAGNOSIS — Z79899 Other long term (current) drug therapy: Secondary | ICD-10-CM

## 2015-05-15 DIAGNOSIS — Z9221 Personal history of antineoplastic chemotherapy: Secondary | ICD-10-CM | POA: Diagnosis not present

## 2015-05-15 DIAGNOSIS — Z66 Do not resuscitate: Secondary | ICD-10-CM | POA: Diagnosis present

## 2015-05-15 DIAGNOSIS — R600 Localized edema: Secondary | ICD-10-CM | POA: Diagnosis present

## 2015-05-15 DIAGNOSIS — Z923 Personal history of irradiation: Secondary | ICD-10-CM

## 2015-05-15 DIAGNOSIS — I2510 Atherosclerotic heart disease of native coronary artery without angina pectoris: Secondary | ICD-10-CM | POA: Diagnosis present

## 2015-05-15 DIAGNOSIS — C3490 Malignant neoplasm of unspecified part of unspecified bronchus or lung: Secondary | ICD-10-CM | POA: Diagnosis present

## 2015-05-15 DIAGNOSIS — J42 Unspecified chronic bronchitis: Secondary | ICD-10-CM

## 2015-05-15 DIAGNOSIS — I482 Chronic atrial fibrillation, unspecified: Secondary | ICD-10-CM | POA: Diagnosis present

## 2015-05-15 DIAGNOSIS — I7121 Aneurysm of the ascending aorta, without rupture: Secondary | ICD-10-CM | POA: Diagnosis present

## 2015-05-15 LAB — CBC WITH DIFFERENTIAL/PLATELET
BASOS ABS: 0 10*3/uL (ref 0.0–0.1)
BASOS PCT: 0 % (ref 0–1)
EOS ABS: 0.1 10*3/uL (ref 0.0–0.7)
Eosinophils Relative: 2 % (ref 0–5)
HCT: 35.6 % — ABNORMAL LOW (ref 39.0–52.0)
Hemoglobin: 11 g/dL — ABNORMAL LOW (ref 13.0–17.0)
Lymphocytes Relative: 10 % — ABNORMAL LOW (ref 12–46)
Lymphs Abs: 0.7 10*3/uL (ref 0.7–4.0)
MCH: 27 pg (ref 26.0–34.0)
MCHC: 30.9 g/dL (ref 30.0–36.0)
MCV: 87.5 fL (ref 78.0–100.0)
Monocytes Absolute: 0.8 10*3/uL (ref 0.1–1.0)
Monocytes Relative: 12 % (ref 3–12)
NEUTROS ABS: 5 10*3/uL (ref 1.7–7.7)
Neutrophils Relative %: 76 % (ref 43–77)
PLATELETS: 161 10*3/uL (ref 150–400)
RBC: 4.07 MIL/uL — AB (ref 4.22–5.81)
RDW: 15.6 % — ABNORMAL HIGH (ref 11.5–15.5)
WBC: 6.5 10*3/uL (ref 4.0–10.5)

## 2015-05-15 LAB — COMPREHENSIVE METABOLIC PANEL
ALT: 10 U/L — ABNORMAL LOW (ref 17–63)
AST: 22 U/L (ref 15–41)
Albumin: 2.9 g/dL — ABNORMAL LOW (ref 3.5–5.0)
Alkaline Phosphatase: 92 U/L (ref 38–126)
Anion gap: 8 (ref 5–15)
BUN: 15 mg/dL (ref 6–20)
CHLORIDE: 108 mmol/L (ref 101–111)
CO2: 26 mmol/L (ref 22–32)
Calcium: 9.3 mg/dL (ref 8.9–10.3)
Creatinine, Ser: 1.16 mg/dL (ref 0.61–1.24)
GFR calc Af Amer: >60 mL/min (ref >60)
GFR, EST NON AFRICAN AMERICAN: 58 mL/min — AB (ref >60)
GLUCOSE: 85 mg/dL (ref 65–99)
POTASSIUM: 3.8 mmol/L (ref 3.5–5.1)
Sodium: 142 mmol/L (ref 135–145)
TOTAL PROTEIN: 6.5 g/dL (ref 6.5–8.1)
Total Bilirubin: 0.5 mg/dL (ref 0.3–1.2)

## 2015-05-15 LAB — I-STAT CHEM 8, ED
BUN: 20 mg/dL (ref 6–20)
CALCIUM ION: 1.28 mmol/L (ref 1.13–1.30)
Chloride: 106 mmol/L (ref 101–111)
Creatinine, Ser: 1.1 mg/dL (ref 0.61–1.24)
Glucose, Bld: 82 mg/dL (ref 65–99)
HCT: 34 % — ABNORMAL LOW (ref 39.0–52.0)
Hemoglobin: 11.6 g/dL — ABNORMAL LOW (ref 13.0–17.0)
POTASSIUM: 3.6 mmol/L (ref 3.5–5.1)
Sodium: 144 mmol/L (ref 135–145)
TCO2: 25 mmol/L (ref 0–100)

## 2015-05-15 LAB — I-STAT TROPONIN, ED: TROPONIN I, POC: 0.01 ng/mL (ref 0.00–0.08)

## 2015-05-15 LAB — I-STAT CG4 LACTIC ACID, ED: LACTIC ACID, VENOUS: 0.84 mmol/L (ref 0.5–2.0)

## 2015-05-15 LAB — BRAIN NATRIURETIC PEPTIDE: B NATRIURETIC PEPTIDE 5: 123.1 pg/mL — AB (ref 0.0–100.0)

## 2015-05-15 MED ORDER — DEXTROSE 5 % IV SOLN
2.0000 g | Freq: Three times a day (TID) | INTRAVENOUS | Status: DC
  Administered 2015-05-16 – 2015-05-17 (×4): 2 g via INTRAVENOUS
  Filled 2015-05-15 (×7): qty 2

## 2015-05-15 MED ORDER — IOHEXOL 350 MG/ML SOLN
80.0000 mL | Freq: Once | INTRAVENOUS | Status: AC | PRN
  Administered 2015-05-15: 100 mL via INTRAVENOUS

## 2015-05-15 MED ORDER — ASPIRIN 81 MG PO CHEW
324.0000 mg | CHEWABLE_TABLET | Freq: Once | ORAL | Status: AC
  Administered 2015-05-15: 324 mg via ORAL
  Filled 2015-05-15: qty 4

## 2015-05-15 MED ORDER — VANCOMYCIN HCL IN DEXTROSE 1-5 GM/200ML-% IV SOLN
1000.0000 mg | Freq: Once | INTRAVENOUS | Status: AC
  Administered 2015-05-15: 1000 mg via INTRAVENOUS
  Filled 2015-05-15: qty 200

## 2015-05-15 MED ORDER — IPRATROPIUM-ALBUTEROL 0.5-2.5 (3) MG/3ML IN SOLN
3.0000 mL | Freq: Once | RESPIRATORY_TRACT | Status: AC
  Administered 2015-05-15: 3 mL via RESPIRATORY_TRACT
  Filled 2015-05-15: qty 3

## 2015-05-15 MED ORDER — VANCOMYCIN HCL IN DEXTROSE 750-5 MG/150ML-% IV SOLN
750.0000 mg | Freq: Two times a day (BID) | INTRAVENOUS | Status: DC
  Administered 2015-05-16 – 2015-05-17 (×3): 750 mg via INTRAVENOUS
  Filled 2015-05-15 (×4): qty 150

## 2015-05-15 NOTE — Progress Notes (Signed)
Pt continuing to refuse to let RT try to obtain ABG. RT stuck pt once and pt refused to be stuck again

## 2015-05-15 NOTE — H&P (Signed)
[facility] History and Physical  BRITTEN SEYFRIED ZOX:[id number] DOB: [birth year] DOA: 05/15/2015  Referring physician: ED physician PCP: [name], MD  Specialists:   Chief Complaint: SOB  HPI: [name] is a 81 y.o. male with PMH of lung cancer (SCC, s/p of radiation and chemotherapy), COPD, GERD, CAD, thoracic ascending aortic aneurysm, atrial fibrillation on Xarelto, who presents with shortness of breath.  Patient reports that he started having SOB since yesterday morning. He has worsening dyspnea with exertion today. He has mild cough with clear mucus production, but no chest pain, fever or chills. No runny nose or sore throat. He states that he recently took a long car trip to [place]. He states that he has been compliant to Xarelto without bleeding tendency. He has chronic leg swelling right greater than left which is unchanged. He is taking lasix 40 mg daily for his leg edema. He said that he dose not have CHF. Patient does not have abdominal pain, diarrhea, rashes, unilateral weakness, symptoms of UTI.  In ED, patient was found to have WBC 6.5, temperature normal, no tachycardia, electrolytes okay. CTA of chest showed no evidence of pulmonary embolus, RLL infiltration, 4.3 cm ascending thoracic aortic aneurysm, stable 3 cm right suprahilar mass and stable bilateral pulmonary nodules. Patient is admitted to inpatient for further evaluation and treatment.  Where does patient live?   At home   Can patient participate in ADLs?   Some   Review of Systems:   General: no fevers, chills, no changes in body weight, has fatigue HEENT: no blurry vision, hearing changes or sore throat Pulm: has dyspnea, coughing, no wheezing CV: no chest pain, palpitations Abd: no nausea, vomiting, abdominal pain, diarrhea, constipation GU: no dysuria, burning on urination, increased urinary frequency, hematuria  Ext: has leg edema. Neuro: no unilateral weakness, numbness, or tingling, no  vision change or hearing loss Skin: no rash MSK: No muscle spasm, no deformity, no limitation of range of movement in spin Heme: No easy bruising.  Travel history: No recent long distant travel.  Allergy:  Allergies  Allergen Reactions  . Codeine Itching    Past Medical History  Diagnosis Date  . Syncope   . COPD (chronic obstructive pulmonary disease)   . Chronic atrial fibrillation   . GERD (gastroesophageal reflux disease)   . Coronary artery disease   . Kidney stone   . CAD (coronary artery disease), native coronary artery 12/03/2014  . Thoracic ascending aortic aneurysm 12/03/2014    4.2 x 4.3 cm noted on CT scan February 2016   . SCC (squamous cell carcinoma of lung)   . Radiation 01/04/15-02/16/15    right upper chest/hilar region 60 Gy    Past Surgical History  Procedure Laterality Date  . Appendectomy    . Cholecystectomy    . Esophageal dilation  2012  . Video bronchoscopy Bilateral 11/29/2014    Procedure: VIDEO BRONCHOSCOPY WITHOUT FLUORO;  Surgeon: Collene Gobble, MD;  Location: [facility];  Service: Cardiopulmonary;  Laterality: Bilateral;  . Esophagogastroduodenoscopy N/A 11/30/2014    Procedure: ESOPHAGOGASTRODUODENOSCOPY (EGD);  Surgeon: Arta Silence, MD;  Location: [facility] ENDOSCOPY;  Service: Endoscopy;  Laterality: N/A;    Social History:  reports that he quit smoking about 5 years ago. His smoking use included Cigarettes. He has a 130 pack-year smoking history. He has never used smokeless tobacco. He reports that he does not drink alcohol. His drug history is not on file.  Family History:  Family History  Problem Relation  Age of Onset  . Stroke    . Cancer    . Heart attack Sister   . Lung cancer Daughter   . Cancer      wife sinus cancer     Prior to Admission medications   Medication Sig Start Date End Date Taking? Authorizing Provider  albuterol (PROVENTIL HFA;VENTOLIN HFA) 108 (90 BASE) MCG/ACT inhaler Inhale 1-2 puffs into the lungs every 6  (six) hours as needed for wheezing or shortness of breath.    Historical Provider, MD  budesonide-formoterol (SYMBICORT) 160-4.5 MCG/ACT inhaler Inhale 2 puffs into the lungs 2 (two) times daily.    Historical Provider, MD  diltiazem (CARDIZEM) 90 MG tablet Take 90 mg by mouth 2 (two) times daily. Take two tablets in the morning and in the afternoon    Historical Provider, MD  diphenhydramine-acetaminophen (TYLENOL PM) 25-500 MG TABS Take 2 tablets by mouth at bedtime as needed.    Historical Provider, MD  furosemide (LASIX) 40 MG tablet Take 0.5 tablets (20 mg total) by mouth daily. 12/01/14   Barton Dubois, MD  hyaluronate sodium (RADIAPLEXRX) GEL Apply 1 application topically once.    Historical Provider, MD  hydrOXYzine (ATARAX/VISTARIL) 10 MG tablet Take 10 mg by mouth 3 (three) times daily as needed.    Historical Provider, MD  pantoprazole (PROTONIX) 40 MG tablet Take 1 tablet (40 mg total) by mouth 2 (two) times daily. Patient not taking: Reported on 03/28/2015 12/01/14   Barton Dubois, MD  prochlorperazine (COMPAZINE) 10 MG tablet Take 1 tablet (10 mg total) by mouth every 6 (six) hours as needed for nausea or vomiting. Patient not taking: Reported on 03/28/2015 01/04/15   Curt Bears, MD  rivaroxaban (XARELTO) 20 MG TABS tablet Take 20 mg by mouth daily with supper.    Historical Provider, MD  triamcinolone cream (KENALOG) 0.1 %  02/26/15   Historical Provider, MD  zolpidem (AMBIEN) 10 MG tablet Take 5 mg by mouth at bedtime as needed for sleep.    Historical Provider, MD    Physical Exam: Filed Vitals:   05/15/15 1915 05/15/15 1930 05/15/15 2051 05/15/15 2100  BP: 119/70 110/66 121/57 115/74  Pulse: 90 92  95  Temp:      TempSrc:      Resp: 25 20    Height:      Weight:      SpO2: 94% 93%  93%   General: Not in acute distress HEENT:       Eyes: PERRL, EOMI, no scleral icterus.       ENT: No discharge from the ears and nose, no pharynx injection, no tonsillar enlargement.         Neck: No JVD, no bruit, no mass felt. Heme: No neck lymph node enlargement. Cardiac: S1/S2, irregularly irregular rhythm, No murmurs, No gallops or rubs. Pulm: decreased air movement bilaterally. No rales, wheezing, rhonchi or rubs. Abd: Soft, nondistended, nontender, no rebound pain, no organomegaly, BS present. Ext: 1+ pitting leg edema in R and trace leg edema in L leg. 2+DP/PT pulse bilaterally. Musculoskeletal: No joint deformities, No joint redness or warmth, no limitation of ROM in spin. Skin: No rashes.  Neuro: Alert, oriented X3, cranial nerves II-XII grossly intact, muscle strength 5/5 in all extremities, sensation to light touch intact.  Psych: Patient is not psychotic, no suicidal or hemocidal ideation.  Labs on Admission:  Basic Metabolic Panel:  Recent Labs Lab 05/15/15 1700 05/15/15 1841  NA 142 144  K 3.8 3.6  CL 108 106  CO2 26  --   GLUCOSE 85 82  BUN 15 20  CREATININE 1.16 1.10  CALCIUM 9.3  --    Liver Function Tests:  Recent Labs Lab 05/15/15 1700  AST 22  ALT 10*  ALKPHOS 92  BILITOT 0.5  PROT 6.5  ALBUMIN 2.9*   No results for input(s): LIPASE, AMYLASE in the last 168 hours. No results for input(s): AMMONIA in the last 168 hours. CBC:  Recent Labs Lab 05/15/15 1700 05/15/15 1841  WBC 6.5  --   NEUTROABS 5.0  --   HGB 11.0* 11.6*  HCT 35.6* 34.0*  MCV 87.5  --   PLT 161  --    Cardiac Enzymes: No results for input(s): CKTOTAL, CKMB, CKMBINDEX, TROPONINI in the last 168 hours.  BNP (last 3 results)  Recent Labs  11/25/14 1220 05/15/15 1650  BNP 119.9* 123.1*    ProBNP (last 3 results) No results for input(s): PROBNP in the last 8760 hours.  CBG: No results for input(s): GLUCAP in the last 168 hours.  Radiological Exams on Admission: Dg Chest 2 View  05/15/2015   CLINICAL DATA:  Two day history of shortness of breath. History of asthma, COPD and lung cancer.  EXAM: CHEST  2 VIEW  COMPARISON:  Chest x-ray 11/26/2014 and  chest CT 03/19/2015.  FINDINGS: Significant interval decrease in size of the right hilar mass. There is persistent right upper lobe and right middle lobe interstitial and airspace opacity. Some of this appears to be scarring change and interstitial lung disease. There is also likely some component of radiation change. Stable emphysematous changes. Suspect an enlarging left lower lobe pulmonary nodule. No pleural effusion or pneumothorax.  IMPRESSION: Probable combination of interstitial lung disease, emphysema and radiation fibrosis involving the right lung.  Possible enlarging left basilar pulmonary nodule.   Electronically Signed   By: Marijo Sanes M.D.   On: 05/15/2015 17:37   Ct Angio Chest Pe W/cm &/or Wo Cm  05/15/2015   CLINICAL DATA:  Shortness of breath.  EXAM: CT ANGIOGRAPHY CHEST WITH CONTRAST  TECHNIQUE: Multidetector CT imaging of the chest was performed using the standard protocol during bolus administration of intravenous contrast. Multiplanar CT image reconstructions and MIPs were obtained to evaluate the vascular anatomy.  CONTRAST:  11m OMNIPAQUE IOHEXOL 350 MG/ML SOLN  COMPARISON:  CT scan of March 19, 2015.  FINDINGS: No pneumothorax is noted. Right pleural effusion is significantly enlarged compared to prior exam. Emphysematous disease is noted throughout both lungs. New interstitial and airspace opacity is noted in the right lower lobe concerning for pneumonia. 3.0 x 2.6 cm right suprahilar mass is noted consistent with malignancy; this is not significantly changed compared to prior exam. Stable 6 mm nodule is noted in left lower lobe laterally. Right lung nodules noted on prior exam are not visualized due to previously described inflammatory disease. Stable 5 mm nodule is noted laterally in right lower lobe.  No definite evidence of pulmonary embolus is noted. Atherosclerosis of thoracic aorta is noted without dissection. 4.3 cm ascending thoracic aortic aneurysm is noted. Coronary artery  calcifications are noted. No mediastinal adenopathy is noted. Visualized portion of upper abdomen is unremarkable.  Review of the MIP images confirms the above findings.  IMPRESSION: No evidence of pulmonary embolus.  4.3 cm ascending thoracic aortic aneurysm is noted.  Interval development of large airspace and interstitial opacity in right lower lobe most consistent with pneumonia. Increased right pleural effusion is noted as  well.  Stable 3 cm right suprahilar mass is noted concerning for malignancy. Stable bilateral pulmonary nodules are noted.   Electronically Signed   By: Marijo Conception, M.D.   On: 05/15/2015 21:17    EKG: Independently reviewed.  Abnormal findings:  Afib, LAD.  Assessment/Plan Principal Problem:   CAP (community acquired pneumonia) Active Problems:   Chronic atrial fibrillation   COPD (chronic obstructive pulmonary disease)   SCC (squamous cell carcinoma of lung)   CAD (coronary artery disease), native coronary artery   Thoracic ascending aortic aneurysm   Current use of long term anticoagulation   HCAP (healthcare-associated pneumonia)  CAP (community acquired pneumonia): Though patient had chemotherapy and radiation radiation therapy, both of which are beyond one month, therefore patient does not meet criteria for HCAP. Since patient may be immunosuppressed from recent chemotherapy, will need Abx with coverage. Currently patient is not septic, hemodynamically stable.  - Will admit to Telemetry Bed - IV Vancomycin and Fortaz - Mucinex for cough  - DuoNeb, albuterolNeb prn for SOB - Urine legionella and S. pneumococcal antigen - Follow up blood culture x2, sputum culture, plus Flu pcr - will get Procalcitonin and trend lactic acid level  Atrial Fibrillation: CHA2DS2-VASc Score is 3, needs oral anticoagulation. Patient is Xarelto at home. Heart rate is well controlled. -Continue Xarelto -Cardizem  COPD: Does not seem to have exacerbation -- DuoNeb, albuterolNeb  prn for SOB  SCC (squamous cell carcinoma of lung): followed up by Dr.  Julien Nordmann. Last seen was on 03/26/15. Per Dr. Julien Nordmann, pt has stage IIIA (T3, N1, M0) non-small cell lung cancer, squamous cell carcinoma, diagnosed in February 2016. He is s/p of radiation therapy and chemotherapy.  -follow up with Dr. Julien Nordmann.  CAD: stable. No CP. -Observe closely  Thoracic ascending aortic aneurysm: stable. Size has no change since 11/2014, currently 4.3 cm by CTA -follow up with Cardiology  GERD: -Pepcid  DVT ppx: On Xarelto  Code Status: DNR Family Communication:  Yes, patient's  daughter at bed side Disposition Plan: Admit to inpatient   Date of Service 05/15/2015    Ivor Costa [facility] Pager [phone]  If 7PM-7AM, please contact night-coverage [email or web address] Password [email or web address] 05/15/2015, 10:05 PM

## 2015-05-15 NOTE — Progress Notes (Signed)
RT attempted to obtain ABG maximum amount of times.  Unable to obtain sample.  Patient not in distress at this time.  Will notify RN.

## 2015-05-15 NOTE — ED Notes (Addendum)
Pt presents to department for evaluation of SOB, onset yesterday morning, reports he had long drive from [place]. Also states intermittent chest pain and fatigue. Respirations labored at the time. Pt is alert and oriented x4.

## 2015-05-15 NOTE — ED Notes (Signed)
Respiratory informed the writer that unable to obtain ABG after 2 attempts. Dr. Wyvonnia Dusky informed.

## 2015-05-15 NOTE — ED Notes (Signed)
Pts O2 sats decreased to 84% just after getting out of his bed. He did not walk around room. This RN carefully assisted patient back into bed. Pt began to breathe heavily and stated he was SOB and his O2 sats dropped to 82%. 4L Frank placed on patient. Pt lying in bed, calmed down and now O2 95%. Dr. Wyvonnia Dusky made aware.

## 2015-05-15 NOTE — Progress Notes (Addendum)
ANTIBIOTIC CONSULT NOTE - INITIAL  Pharmacy Consult for Ceftazidime and Vancomycin Indication: rule out pneumonia  Allergies  Allergen Reactions  . Codeine Itching    Patient Measurements: Height: '5\' 10"'$  (177.8 cm) Weight: 194 lb (87.998 kg) IBW/kg (Calculated) : 73 Adjusted Body Weight:   Vital Signs: Temp: 98.1 F (36.7 C) (08/09 1647) Temp Source: Oral (08/09 1647) BP: 115/74 mmHg (08/09 2100) Pulse Rate: 95 (08/09 2100) Intake/Output from previous day:   Intake/Output from this shift:    Labs:  Recent Labs  05/15/15 1700 05/15/15 1841  WBC 6.5  --   HGB 11.0* 11.6*  PLT 161  --   CREATININE 1.16 1.10   Estimated Creatinine Clearance: 59.8 mL/min (by C-G formula based on Cr of 1.1). No results for input(s): VANCOTROUGH, VANCOPEAK, VANCORANDOM, GENTTROUGH, GENTPEAK, GENTRANDOM, TOBRATROUGH, TOBRAPEAK, TOBRARND, AMIKACINPEAK, AMIKACINTROU, AMIKACIN in the last 72 hours.   Microbiology: No results found for this or any previous visit (from the past 720 hour(s)).  Medical History: Past Medical History  Diagnosis Date  . Syncope   . COPD (chronic obstructive pulmonary disease)   . Chronic atrial fibrillation   . GERD (gastroesophageal reflux disease)   . Coronary artery disease   . Kidney stone   . CAD (coronary artery disease), native coronary artery 12/03/2014  . Thoracic ascending aortic aneurysm 12/03/2014    4.2 x 4.3 cm noted on CT scan February 2016   . SCC (squamous cell carcinoma of lung)   . Radiation 01/04/15-02/16/15    right upper chest/hilar region 60 Gy    Medications:   (Not in a hospital admission) Scheduled:   Infusions:  . cefTAZidime (FORTAZ)  IV    . vancomycin     Assessment: 81yo male with history of COPD, Afib, HTN and CAD presents with SOB and CP. Pharmacy is consulted to dose ceftazidime and vancomycin for suspected HCAP. Pt is afebrile, WBC 6.5, sCr 1.1.  Pt received vancomycin 1g in the ED.  Goal of Therapy:  Vancomycin  trough level 15-20 mcg/ml  Plan:  Ceftazidime 2g IV q8h Vancomycin 1g IV once followed by '750mg'$  q12h Expected duration 7 days with resolution of temperature and/or normalization of WBC Measure antibiotic drug levels at steady state Follow up culture results, renal function and clinical course  Andrey Cota. Diona Foley, PharmD Clinical Pharmacist Pager [phone] 05/15/2015,9:50 PM

## 2015-05-15 NOTE — ED Notes (Signed)
Pt refusing at this time to have ABG done by RT.

## 2015-05-15 NOTE — ED Provider Notes (Signed)
CSN: [id number]     Arrival date & time 05/15/15  1639 History   First MD Initiated Contact with Patient 05/15/15 1719     Chief Complaint  Patient presents with  . Shortness of Breath  . Chest Pain     (Consider location/radiation/quality/duration/timing/severity/associated sxs/prior Treatment) HPI Comments: Patient presents with 2 day history of shortness of breath onset yesterday morning. Not relieved by home albuterol. History of COPD, asthma, lung mass status post radiation and chemotherapy. Denies fever. Denies chest pain. Some discomfort with coughing. Recently took a long car trip to [place]. He is on xarelto for a history of atrial fibrillation and states compliance. No history of blood clots. Chronic leg swelling right greater than left which is unchanged. Denies any history of heart failure. No fever. He is able to lay flat at baseline without a problem. He has worsening dyspnea with exertion today.  Patient is a 81 y.o. male presenting with chest pain. The history is provided by the patient and a relative. The history is limited by the condition of the patient.  Chest Pain Associated symptoms: cough, fatigue and shortness of breath   Associated symptoms: no abdominal pain, no dizziness, no fever, no headache, no nausea, not vomiting and no weakness     Past Medical History  Diagnosis Date  . Syncope   . COPD (chronic obstructive pulmonary disease)   . Chronic atrial fibrillation   . GERD (gastroesophageal reflux disease)   . Coronary artery disease   . Kidney stone   . CAD (coronary artery disease), native coronary artery 12/03/2014  . Thoracic ascending aortic aneurysm 12/03/2014    4.2 x 4.3 cm noted on CT scan February 2016   . SCC (squamous cell carcinoma of lung)   . Radiation 01/04/15-02/16/15    right upper chest/hilar region 60 Gy   Past Surgical History  Procedure Laterality Date  . Appendectomy    . Cholecystectomy    . Esophageal dilation  2012  . Video  bronchoscopy Bilateral 11/29/2014    Procedure: VIDEO BRONCHOSCOPY WITHOUT FLUORO;  Surgeon: Collene Gobble, MD;  Location: [place];  Service: Cardiopulmonary;  Laterality: Bilateral;  . Esophagogastroduodenoscopy N/A 11/30/2014    Procedure: ESOPHAGOGASTRODUODENOSCOPY (EGD);  Surgeon: Arta Silence, MD;  Location: [facility] ENDOSCOPY;  Service: Endoscopy;  Laterality: N/A;   Family History  Problem Relation Age of Onset  . Stroke    . Cancer    . Heart attack Sister   . Lung cancer Daughter   . Cancer      wife sinus cancer   Social History  Substance Use Topics  . Smoking status: Former Smoker -- 2.00 packs/day for 65 years    Types: Cigarettes    Quit date: 12/12/2009  . Smokeless tobacco: Never Used  . Alcohol Use: No     Comment: has not drank in 4-5 years.  Has a beer every once in awhile.    Review of Systems  Constitutional: Positive for activity change, appetite change and fatigue. Negative for fever.  HENT: Negative for congestion.   Respiratory: Positive for cough and shortness of breath. Negative for apnea.   Cardiovascular: Positive for chest pain.  Gastrointestinal: Negative for nausea, vomiting and abdominal pain.  Genitourinary: Negative for dysuria, hematuria and testicular pain.  Musculoskeletal: Negative for myalgias and arthralgias.  Skin: Negative for rash.  Neurological: Negative for dizziness, weakness and headaches.  A complete 10 system review of systems was obtained and all systems are negative except as noted  in the HPI and PMH.      Allergies  Codeine  Home Medications   Prior to Admission medications   Medication Sig Start Date End Date Taking? Authorizing Provider  acetaminophen (TYLENOL) 500 MG tablet Take 1,500 mg by mouth daily as needed (pain).   Yes Historical Provider, MD  albuterol (PROAIR HFA) 108 (90 BASE) MCG/ACT inhaler Inhale 2 puffs into the lungs every 6 (six) hours as needed for wheezing or shortness of breath.   Yes Historical  Provider, MD  budesonide-formoterol (SYMBICORT) 160-4.5 MCG/ACT inhaler Inhale 2 puffs into the lungs 2 (two) times daily.   Yes Historical Provider, MD  diltiazem (CARDIZEM) 90 MG tablet Take 180 mg by mouth 2 (two) times daily.    Yes Historical Provider, MD  famotidine (PEPCID) 20 MG tablet Take 20 mg by mouth at bedtime.   Yes Historical Provider, MD  furosemide (LASIX) 40 MG tablet Take 0.5 tablets (20 mg total) by mouth daily. Patient taking differently: Take 40 mg by mouth daily.  12/01/14  Yes Barton Dubois, MD  hydrOXYzine (ATARAX/VISTARIL) 10 MG tablet Take 10-20 mg by mouth every 6 (six) hours as needed for itching.    Yes Historical Provider, MD  ibuprofen (ADVIL,MOTRIN) 200 MG tablet Take 200 mg by mouth daily as needed (pain).   Yes Historical Provider, MD  polyethylene glycol (MIRALAX / GLYCOLAX) packet Take 17 g by mouth daily as needed (constipation). Mix in 8 oz of liquid and drink   Yes Historical Provider, MD  rivaroxaban (XARELTO) 20 MG TABS tablet Take 20 mg by mouth daily with supper.   Yes Historical Provider, MD  zolpidem (AMBIEN) 10 MG tablet Take 10 mg by mouth at bedtime.    Yes Historical Provider, MD  pantoprazole (PROTONIX) 40 MG tablet Take 1 tablet (40 mg total) by mouth 2 (two) times daily. Patient not taking: Reported on 03/28/2015 12/01/14   Barton Dubois, MD  prochlorperazine (COMPAZINE) 10 MG tablet Take 1 tablet (10 mg total) by mouth every 6 (six) hours as needed for nausea or vomiting. Patient not taking: Reported on 03/28/2015 01/04/15   Curt Bears, MD   BP 108/69 mmHg  Pulse 77  Temp(Src) 98.2 F (36.8 C) (Oral)  Resp 18  Ht '5\' 10"'$  (1.778 m)  Wt 184 lb 3 oz (83.547 kg)  BMI 26.43 kg/m2  SpO2 94% Physical Exam  Constitutional: He is oriented to person, place, and time. He appears well-developed and well-nourished. He appears distressed.  Increased work of breathing, speaking in short sentences  HENT:  Head: Normocephalic and atraumatic.   Mouth/Throat: Oropharynx is clear and moist. No oropharyngeal exudate.  Eyes: Conjunctivae and EOM are normal. Pupils are equal, round, and reactive to light.  Neck: Normal range of motion. Neck supple.  No meningismus.  Cardiovascular: Normal rate, regular rhythm, normal heart sounds and intact distal pulses.   No murmur heard. Pulmonary/Chest: Effort normal. No respiratory distress.  Very diminished breath sounds, decreased on right.  Abdominal: Soft. There is no tenderness. There is no rebound and no guarding.  Musculoskeletal: Normal range of motion. He exhibits edema. He exhibits no tenderness.  +2 pitting edema bilaterally right greater than left.  Neurological: He is alert and oriented to person, place, and time. No cranial nerve deficit. He exhibits normal muscle tone. Coordination normal.  No ataxia on finger to nose bilaterally. No pronator drift. 5/5 strength throughout. CN 2-12 intact.Equal grip strength. Sensation intact.   Skin: Skin is warm.  Psychiatric: He has  a normal mood and affect. His behavior is normal.  Nursing note and vitals reviewed.   ED Course  Procedures (including critical care time) Labs Review Labs Reviewed  CBC WITH DIFFERENTIAL/PLATELET - Abnormal; Notable for the following:    RBC 4.07 (*)    Hemoglobin 11.0 (*)    HCT 35.6 (*)    RDW 15.6 (*)    Lymphocytes Relative 10 (*)    All other components within normal limits  COMPREHENSIVE METABOLIC PANEL - Abnormal; Notable for the following:    Albumin 2.9 (*)    ALT 10 (*)    GFR calc non Af Amer 58 (*)    All other components within normal limits  BRAIN NATRIURETIC PEPTIDE - Abnormal; Notable for the following:    B Natriuretic Peptide 123.1 (*)    All other components within normal limits  PROTIME-INR - Abnormal; Notable for the following:    Prothrombin Time 20.6 (*)    INR 1.78 (*)    All other components within normal limits  BRAIN NATRIURETIC PEPTIDE - Abnormal; Notable for the  following:    B Natriuretic Peptide 268.1 (*)    All other components within normal limits  APTT - Abnormal; Notable for the following:    aPTT 41 (*)    All other components within normal limits  I-STAT CHEM 8, ED - Abnormal; Notable for the following:    Hemoglobin 11.6 (*)    HCT 34.0 (*)    All other components within normal limits  CULTURE, BLOOD (ROUTINE X 2)  CULTURE, BLOOD (ROUTINE X 2)  CULTURE, EXPECTORATED SPUTUM-ASSESSMENT  GRAM STAIN  TROPONIN I  LACTIC ACID, PLASMA  LACTIC ACID, PLASMA  PROCALCITONIN  INFLUENZA PANEL BY PCR (TYPE A & B, H1N1)(NOT AT [facility])  LEGIONELLA ANTIGEN, URINE  STREP PNEUMONIAE URINARY ANTIGEN  I-STAT TROPOININ, ED  I-STAT CG4 LACTIC ACID, ED  I-STAT ARTERIAL BLOOD GAS, ED    Imaging Review Dg Chest 2 View  05/15/2015   CLINICAL DATA:  Two day history of shortness of breath. History of asthma, COPD and lung cancer.  EXAM: CHEST  2 VIEW  COMPARISON:  Chest x-ray 11/26/2014 and chest CT 03/19/2015.  FINDINGS: Significant interval decrease in size of the right hilar mass. There is persistent right upper lobe and right middle lobe interstitial and airspace opacity. Some of this appears to be scarring change and interstitial lung disease. There is also likely some component of radiation change. Stable emphysematous changes. Suspect an enlarging left lower lobe pulmonary nodule. No pleural effusion or pneumothorax.  IMPRESSION: Probable combination of interstitial lung disease, emphysema and radiation fibrosis involving the right lung.  Possible enlarging left basilar pulmonary nodule.   Electronically Signed   By: Marijo Sanes M.D.   On: 05/15/2015 17:37   Ct Angio Chest Pe W/cm &/or Wo Cm  05/15/2015   CLINICAL DATA:  Shortness of breath.  EXAM: CT ANGIOGRAPHY CHEST WITH CONTRAST  TECHNIQUE: Multidetector CT imaging of the chest was performed using the standard protocol during bolus administration of intravenous contrast. Multiplanar CT image  reconstructions and MIPs were obtained to evaluate the vascular anatomy.  CONTRAST:  142m OMNIPAQUE IOHEXOL 350 MG/ML SOLN  COMPARISON:  CT scan of March 19, 2015.  FINDINGS: No pneumothorax is noted. Right pleural effusion is significantly enlarged compared to prior exam. Emphysematous disease is noted throughout both lungs. New interstitial and airspace opacity is noted in the right lower lobe concerning for pneumonia. 3.0 x 2.6 cm right suprahilar  mass is noted consistent with malignancy; this is not significantly changed compared to prior exam. Stable 6 mm nodule is noted in left lower lobe laterally. Right lung nodules noted on prior exam are not visualized due to previously described inflammatory disease. Stable 5 mm nodule is noted laterally in right lower lobe.  No definite evidence of pulmonary embolus is noted. Atherosclerosis of thoracic aorta is noted without dissection. 4.3 cm ascending thoracic aortic aneurysm is noted. Coronary artery calcifications are noted. No mediastinal adenopathy is noted. Visualized portion of upper abdomen is unremarkable.  Review of the MIP images confirms the above findings.  IMPRESSION: No evidence of pulmonary embolus.  4.3 cm ascending thoracic aortic aneurysm is noted.  Interval development of large airspace and interstitial opacity in right lower lobe most consistent with pneumonia. Increased right pleural effusion is noted as well.  Stable 3 cm right suprahilar mass is noted concerning for malignancy. Stable bilateral pulmonary nodules are noted.   Electronically Signed   By: Marijo Conception, M.D.   On: 05/15/2015 21:17     EKG Interpretation   Date/Time:  Tuesday May 15 2015 16:45:57 EDT Ventricular Rate:  87 PR Interval:    QRS Duration: 98 QT Interval:  378 QTC Calculation: 119 R Axis:   -26 Text Interpretation:  Atrial fibrillation with a competing junctional  pacemaker Nonspecific ST abnormality Abnormal ECG No significant change  was found  Confirmed by Wyvonnia Dusky  MD, Edwinna Rochette [phone]) on 05/15/2015 5:45:35 PM      MDM   Final diagnoses:  HCAP (healthcare-associated pneumonia)   Difficulty breathing with dyspnea on exertion over the past 2 days. Respiratory distress with decreased lung sounds. No hypoxia. Nebulizer.  X-ray shows changes of the right upper and middle lobe with airspace opacity.  On xarelto, states compliance, PE seems less likely despite hx of cancer and recent car trip.  Desaturates to 84% on RA.  No distress on 3L [place].  Saturations 95%. CT shows stable thoracic aneurysm and mass. RLL infiltrate with effusion.  Last radiation 30 days ago. Treat as HCAP.  Cultures sent. Vanc and ceftaz. Admission d/w Dr. Blaine Hamper.   Ezequiel Essex, MD 05/16/15 [phone]

## 2015-05-16 DIAGNOSIS — J449 Chronic obstructive pulmonary disease, unspecified: Secondary | ICD-10-CM

## 2015-05-16 DIAGNOSIS — I482 Chronic atrial fibrillation: Secondary | ICD-10-CM

## 2015-05-16 DIAGNOSIS — J189 Pneumonia, unspecified organism: Principal | ICD-10-CM

## 2015-05-16 LAB — INFLUENZA PANEL BY PCR (TYPE A & B)
H1N1FLUPCR: NOT DETECTED
Influenza A By PCR: NEGATIVE
Influenza B By PCR: NEGATIVE

## 2015-05-16 LAB — BRAIN NATRIURETIC PEPTIDE: B NATRIURETIC PEPTIDE 5: 268.1 pg/mL — AB (ref 0.0–100.0)

## 2015-05-16 LAB — LACTIC ACID, PLASMA
Lactic Acid, Venous: 0.7 mmol/L (ref 0.5–2.0)
Lactic Acid, Venous: 0.6 mmol/L (ref 0.5–2.0)

## 2015-05-16 LAB — PROCALCITONIN

## 2015-05-16 LAB — TROPONIN I: Troponin I: <0.03 ng/mL (ref <0.031)

## 2015-05-16 LAB — PROTIME-INR
INR: 1.78 — AB (ref 0.00–1.49)
PROTHROMBIN TIME: 20.6 s — AB (ref 11.6–15.2)

## 2015-05-16 LAB — APTT: aPTT: 41 seconds — ABNORMAL HIGH (ref 24–37)

## 2015-05-16 MED ORDER — BUDESONIDE-FORMOTEROL FUMARATE 160-4.5 MCG/ACT IN AERO
2.0000 | INHALATION_SPRAY | Freq: Two times a day (BID) | RESPIRATORY_TRACT | Status: DC
  Administered 2015-05-16 – 2015-05-18 (×4): 2 via RESPIRATORY_TRACT
  Filled 2015-05-16: qty 6

## 2015-05-16 MED ORDER — HYDROXYZINE HCL 10 MG PO TABS
10.0000 mg | ORAL_TABLET | Freq: Four times a day (QID) | ORAL | Status: DC | PRN
  Filled 2015-05-16: qty 2

## 2015-05-16 MED ORDER — POLYETHYLENE GLYCOL 3350 17 G PO PACK
17.0000 g | PACK | Freq: Every day | ORAL | Status: DC | PRN
  Filled 2015-05-16: qty 1

## 2015-05-16 MED ORDER — ZOLPIDEM TARTRATE 5 MG PO TABS
10.0000 mg | ORAL_TABLET | Freq: Every day | ORAL | Status: DC
  Administered 2015-05-16 – 2015-05-17 (×2): 10 mg via ORAL
  Filled 2015-05-16 (×2): qty 2

## 2015-05-16 MED ORDER — DILTIAZEM HCL 90 MG PO TABS
180.0000 mg | ORAL_TABLET | Freq: Two times a day (BID) | ORAL | Status: DC
  Administered 2015-05-16 – 2015-05-18 (×5): 180 mg via ORAL
  Filled 2015-05-16 (×6): qty 2

## 2015-05-16 MED ORDER — ACETAMINOPHEN 325 MG PO TABS: 650.0000 mg | ORAL_TABLET | Freq: Every day | ORAL | Status: DC | PRN

## 2015-05-16 MED ORDER — FUROSEMIDE 40 MG PO TABS
40.0000 mg | ORAL_TABLET | Freq: Every day | ORAL | Status: DC
  Administered 2015-05-16 – 2015-05-17 (×2): 40 mg via ORAL
  Filled 2015-05-16 (×3): qty 1

## 2015-05-16 MED ORDER — ASPIRIN 81 MG PO CHEW
324.0000 mg | CHEWABLE_TABLET | Freq: Every day | ORAL | Status: DC
  Administered 2015-05-17 – 2015-05-18 (×2): 324 mg via ORAL
  Filled 2015-05-16 (×2): qty 4

## 2015-05-16 MED ORDER — IPRATROPIUM-ALBUTEROL 0.5-2.5 (3) MG/3ML IN SOLN
3.0000 mL | Freq: Two times a day (BID) | RESPIRATORY_TRACT | Status: DC
  Administered 2015-05-16 – 2015-05-18 (×3): 3 mL via RESPIRATORY_TRACT
  Filled 2015-05-16 (×4): qty 3

## 2015-05-16 MED ORDER — ALBUTEROL SULFATE (2.5 MG/3ML) 0.083% IN NEBU: 2.5000 mg | INHALATION_SOLUTION | RESPIRATORY_TRACT | Status: DC | PRN

## 2015-05-16 MED ORDER — IBUPROFEN 200 MG PO TABS: 200.0000 mg | ORAL_TABLET | Freq: Every day | ORAL | Status: DC | PRN

## 2015-05-16 MED ORDER — IPRATROPIUM-ALBUTEROL 0.5-2.5 (3) MG/3ML IN SOLN
3.0000 mL | Freq: Four times a day (QID) | RESPIRATORY_TRACT | Status: DC
  Administered 2015-05-16 (×2): 3 mL via RESPIRATORY_TRACT
  Filled 2015-05-16 (×2): qty 3

## 2015-05-16 MED ORDER — DM-GUAIFENESIN ER 30-600 MG PO TB12
1.0000 | ORAL_TABLET | Freq: Two times a day (BID) | ORAL | Status: DC
  Administered 2015-05-16 – 2015-05-18 (×5): 1 via ORAL
  Filled 2015-05-16 (×7): qty 1

## 2015-05-16 MED ORDER — RIVAROXABAN 20 MG PO TABS
20.0000 mg | ORAL_TABLET | Freq: Every day | ORAL | Status: DC
  Administered 2015-05-16 – 2015-05-17 (×2): 20 mg via ORAL
  Filled 2015-05-16 (×3): qty 1

## 2015-05-16 MED ORDER — FAMOTIDINE 20 MG PO TABS
20.0000 mg | ORAL_TABLET | Freq: Every day | ORAL | Status: DC
  Administered 2015-05-16 – 2015-05-17 (×2): 20 mg via ORAL
  Filled 2015-05-16 (×4): qty 1

## 2015-05-16 MED ORDER — ZOLPIDEM TARTRATE 5 MG PO TABS
ORAL_TABLET | ORAL | Status: AC
  Administered 2015-05-16: 5 mg
  Filled 2015-05-16: qty 1

## 2015-05-16 NOTE — Evaluation (Signed)
Physical Therapy Evaluation Patient Details Name: [name] MRN: [id number] DOB: [birth year] Today's Date: 05/16/2015   History of Present Illness  Pt is an 81 y/o male with a PMH of lung CA, COPD, GERD, CAD, thoracic ascending aortic aneurysm, a-fib, who presents with SOB and worsening DOE. Pt was found to have CAP and was admitted for further evaluation.   Clinical Impression  Pt admitted with above diagnosis. Pt currently with functional limitations due to the deficits listed below (see PT Problem List). At the time of PT eval pt was able to perform transfers and minimal ambulation at a mod I to supervision level. Pt reports deconditioning from baseline, and is dyspneic with little activity. Pt on RA during session and sats decreased to 83%. He states that although he used to be on home O2 he no longer has it. Pt will benefit from skilled PT to increase their independence and safety with mobility to allow discharge to the venue listed below.       Follow Up Recommendations Home health PT;Supervision - Intermittent    Equipment Recommendations  None recommended by PT    Recommendations for Other Services       Precautions / Restrictions Precautions Precautions: Fall Restrictions Weight Bearing Restrictions: No      Mobility  Bed Mobility Overal bed mobility: Modified Independent                Transfers Overall transfer level: Modified independent Equipment used: None             General transfer comment: No physical assist required. No unsteadiness or LOB noted.   Ambulation/Gait Ambulation/Gait assistance: Supervision Ambulation Distance (Feet): 30 Feet Assistive device: None Gait Pattern/deviations: Step-through pattern;Decreased stride length;Trunk flexed Gait velocity: Decreased Gait velocity interpretation: Below normal speed for age/gender General Gait Details: Pt was able to ambulate in the room a short distance due to DOE. Pt stopped to spit  out phlegm which appeared bloody. On RA sats dropped to 83% during ambulation.   Stairs            Wheelchair Mobility    Modified Rankin (Stroke Patients Only)       Balance Overall balance assessment: No apparent balance deficits (not formally assessed)                                           Pertinent Vitals/Pain Pain Assessment: No/denies pain    Home Living Family/patient expects to be discharged to:: Private residence Living Arrangements: Other relatives (granddaughter) Available Help at Discharge: Family;Available PRN/intermittently Type of Home: House Home Access: Stairs to enter   Entrance Stairs-Number of Steps: 3 Home Layout: One level Home Equipment: Walker - 2 wheels;Cane - single point      Prior Function Level of Independence: Independent         Comments: Does not drive due to low vision. Is able to go out to the grocery store with granddaughter and rides in the motorized cart.      Hand Dominance   Dominant Hand: Right    Extremity/Trunk Assessment   Upper Extremity Assessment: Overall WFL for tasks assessed           Lower Extremity Assessment: Overall WFL for tasks assessed (Baseline LEE R>L)      Cervical / Trunk Assessment: Normal  Communication   Communication: No difficulties  Cognition Arousal/Alertness: Awake/alert  Behavior During Therapy: WFL for tasks assessed/performed Overall Cognitive Status: Within Functional Limits for tasks assessed                      General Comments      Exercises        Assessment/Plan    PT Assessment Patient needs continued PT services  PT Diagnosis Difficulty walking   PT Problem List Decreased strength;Decreased range of motion;Decreased activity tolerance;Decreased balance;Decreased mobility;Decreased knowledge of use of DME;Decreased safety awareness;Decreased knowledge of precautions;Cardiopulmonary status limiting activity  PT Treatment  Interventions DME instruction;Gait training;Stair training;Functional mobility training;Therapeutic activities;Therapeutic exercise;Neuromuscular re-education;Patient/family education   PT Goals (Current goals can be found in the Care Plan section) Acute Rehab PT Goals Patient Stated Goal: Breathe better PT Goal Formulation: With patient Time For Goal Achievement: 05/23/15 Potential to Achieve Goals: Good    Frequency Min 3X/week   Barriers to discharge        Co-evaluation               End of Session Equipment Utilized During Treatment: Oxygen Activity Tolerance: Patient limited by fatigue (SOB/DOE) Patient left: in chair;with call bell/phone within reach Nurse Communication: Mobility status         Time: 6389-3734 PT Time Calculation (min) (ACUTE ONLY): 23 min   Charges:   PT Evaluation $Initial PT Evaluation Tier I: 1 Procedure PT Treatments $Gait Training: 8-22 mins   PT G Codes:        Rolinda Roan 06-Jun-2015, 10:19 AM   Rolinda Roan, PT, DPT Acute Rehabilitation Services Pager: [phone]

## 2015-05-16 NOTE — Progress Notes (Signed)
[facility] PROGRESS NOTE  [name] YTK:[id number] DOB: 1932-12-23 DOA: 05/15/2015 PCP: [name], MD  Assessment/Plan: Community acquired pneumonia -continue current Abx, will change to Po levaquin tomorrow -FU cultures -check Blood and sputum cx -check urine legionella and pneumo Ag -speech eval  Atrial Fibrillation: CHA2DS2-VASc Score is 3, needs oral anticoagulation. Patient is Xarelto at home. Heart rate is well controlled. -Continue Xarelto, Cardizem  COPD: -stable, DuoNeb, albuterolNeb prn for SOB  SCC (squamous cell carcinoma of lung): followed up by Dr. Julien Nordmann. Last seen was on 03/26/15. Per Dr. Julien Nordmann, pt has stage IIIA (T3, N1, M0) non-small cell lung cancer, squamous cell carcinoma, diagnosed in February 2016. He is s/p of radiation therapy and chemotherapy.  -follow up with Dr. Julien Nordmann.  CAD: stable, continue ASA  Thoracic ascending aortic aneurysm: stable. Size has no change since 11/2014, currently 4.3 cm by CTA -follow up with Cardiology/CVTS  GERD: -Pepcid  DVT ppx: On Xarelto  Code Status: Full Code Family Communication: none at bedside Disposition Plan: home in 1-2days  HPI/Subjective: Breathing improving  Objective: Filed Vitals:   05/16/15 0532  BP: 108/69  Pulse: 77  Temp: 98.2 F (36.8 C)  Resp: 18    Intake/Output Summary (Last 24 hours) at 05/16/15 1557 Last data filed at 05/16/15 0535  Gross per 24 hour  Intake      0 ml  Output    150 ml  Net   -150 ml   Filed Weights   05/15/15 1647 05/16/15 0249  Weight: 87.998 kg (194 lb) 83.547 kg (184 lb 3 oz)    Exam:   General:  AAOx3, no distress  Cardiovascular: S1S2/RRR  Respiratory: ronchi at bases  Abdomen: soft, Nt, BS present  Musculoskeletal: no edema c/c   Data Reviewed: Basic Metabolic Panel:  Recent Labs Lab 05/15/15 1700 05/15/15 1841  NA 142 144  K 3.8 3.6  CL 108 106  CO2 26  --   GLUCOSE 85 82  BUN 15 20  CREATININE 1.16 1.10   CALCIUM 9.3  --    Liver Function Tests:  Recent Labs Lab 05/15/15 1700  AST 22  ALT 10*  ALKPHOS 92  BILITOT 0.5  PROT 6.5  ALBUMIN 2.9*   No results for input(s): LIPASE, AMYLASE in the last 168 hours. No results for input(s): AMMONIA in the last 168 hours. CBC:  Recent Labs Lab 05/15/15 1700 05/15/15 1841  WBC 6.5  --   NEUTROABS 5.0  --   HGB 11.0* 11.6*  HCT 35.6* 34.0*  MCV 87.5  --   PLT 161  --    Cardiac Enzymes:  Recent Labs Lab 05/16/15 0058  TROPONINI <0.03   BNP (last 3 results)  Recent Labs  11/25/14 1220 05/15/15 1650 05/16/15 0458  BNP 119.9* 123.1* 268.1*    ProBNP (last 3 results) No results for input(s): PROBNP in the last 8760 hours.  CBG: No results for input(s): GLUCAP in the last 168 hours.  Recent Results (from the past 240 hour(s))  Blood culture (routine x 2)     Status: None (Preliminary result)   Collection Time: 05/15/15  9:27 PM  Result Value Ref Range Status   Specimen Description BLOOD LEFT ARM  Final   Special Requests BOTTLES DRAWN AEROBIC AND ANAEROBIC 10CC  Final   Culture NO GROWTH < 24 HOURS  Final   Report Status PENDING  Incomplete  Blood culture (routine x 2)     Status: None (Preliminary result)   Collection  Time: 05/15/15 10:25 PM  Result Value Ref Range Status   Specimen Description BLOOD LEFT HAND  Final   Special Requests BOTTLES DRAWN AEROBIC ONLY 10CC  Final   Culture NO GROWTH < 24 HOURS  Final   Report Status PENDING  Incomplete     Studies: Dg Chest 2 View  05/15/2015   CLINICAL DATA:  Two day history of shortness of breath. History of asthma, COPD and lung cancer.  EXAM: CHEST  2 VIEW  COMPARISON:  Chest x-ray 11/26/2014 and chest CT 03/19/2015.  FINDINGS: Significant interval decrease in size of the right hilar mass. There is persistent right upper lobe and right middle lobe interstitial and airspace opacity. Some of this appears to be scarring change and interstitial lung disease. There is  also likely some component of radiation change. Stable emphysematous changes. Suspect an enlarging left lower lobe pulmonary nodule. No pleural effusion or pneumothorax.  IMPRESSION: Probable combination of interstitial lung disease, emphysema and radiation fibrosis involving the right lung.  Possible enlarging left basilar pulmonary nodule.   Electronically Signed   By: Marijo Sanes M.D.   On: 05/15/2015 17:37   Ct Angio Chest Pe W/cm &/or Wo Cm  05/15/2015   CLINICAL DATA:  Shortness of breath.  EXAM: CT ANGIOGRAPHY CHEST WITH CONTRAST  TECHNIQUE: Multidetector CT imaging of the chest was performed using the standard protocol during bolus administration of intravenous contrast. Multiplanar CT image reconstructions and MIPs were obtained to evaluate the vascular anatomy.  CONTRAST:  184m OMNIPAQUE IOHEXOL 350 MG/ML SOLN  COMPARISON:  CT scan of March 19, 2015.  FINDINGS: No pneumothorax is noted. Right pleural effusion is significantly enlarged compared to prior exam. Emphysematous disease is noted throughout both lungs. New interstitial and airspace opacity is noted in the right lower lobe concerning for pneumonia. 3.0 x 2.6 cm right suprahilar mass is noted consistent with malignancy; this is not significantly changed compared to prior exam. Stable 6 mm nodule is noted in left lower lobe laterally. Right lung nodules noted on prior exam are not visualized due to previously described inflammatory disease. Stable 5 mm nodule is noted laterally in right lower lobe.  No definite evidence of pulmonary embolus is noted. Atherosclerosis of thoracic aorta is noted without dissection. 4.3 cm ascending thoracic aortic aneurysm is noted. Coronary artery calcifications are noted. No mediastinal adenopathy is noted. Visualized portion of upper abdomen is unremarkable.  Review of the MIP images confirms the above findings.  IMPRESSION: No evidence of pulmonary embolus.  4.3 cm ascending thoracic aortic aneurysm is noted.   Interval development of large airspace and interstitial opacity in right lower lobe most consistent with pneumonia. Increased right pleural effusion is noted as well.  Stable 3 cm right suprahilar mass is noted concerning for malignancy. Stable bilateral pulmonary nodules are noted.   Electronically Signed   By: JMarijo Conception M.D.   On: 05/15/2015 21:17    Scheduled Meds: . budesonide-formoterol  2 puff Inhalation BID  . cefTAZidime (FORTAZ)  IV  2 g Intravenous 3 times per day  . dextromethorphan-guaiFENesin  1 tablet Oral BID  . diltiazem  180 mg Oral BID  . famotidine  20 mg Oral QHS  . furosemide  40 mg Oral Daily  . ipratropium-albuterol  3 mL Nebulization BID  . rivaroxaban  20 mg Oral Q supper  . vancomycin  750 mg Intravenous Q12H  . zolpidem  10 mg Oral QHS   Continuous Infusions:  Antibiotics Given (last 72  hours)    Date/Time Action Medication Dose Rate   05/16/15 0946 Given   vancomycin (VANCOCIN) IVPB 750 mg/150 ml premix 750 mg 150 mL/hr      Principal Problem:   CAP (community acquired pneumonia) Active Problems:   Chronic atrial fibrillation   COPD (chronic obstructive pulmonary disease)   SCC (squamous cell carcinoma of lung)   CAD (coronary artery disease), native coronary artery   Thoracic ascending aortic aneurysm   Current use of long term anticoagulation   HCAP (healthcare-associated pneumonia)    Time spent: 28mn    [name]  [facility] Pager [phone] If 7PM-7AM, please contact night-coverage at [email or web address], password [facility]8/07/2015, 3:57 PM  LOS: 1 day

## 2015-05-16 NOTE — Progress Notes (Signed)
Pt admitted from ED with c/o of SOB, on oxygen at [place] at 97%, pt denies any pain, settled in bed with call light at bedside, will however continue to monitor. Obasogie-Asidi, Delailah Spieth Efe

## 2015-05-16 NOTE — Progress Notes (Signed)
SATURATION QUALIFICATIONS: (This note is used to comply with regulatory documentation for home oxygen)  Patient Saturations on Room Air at Rest = 95%  Patient Saturations on Room Air while Ambulating = 83%  Please briefly explain why patient needs home oxygen: Pt demonstrates oxygen desaturation to 83% on RA during functional mobility. He is not able to maintain O2 sats >88% without supplemental O2.   Please see PT Evaluation for further details of functional mobility.  Rolinda Roan, PT, DPT Acute Rehabilitation Services Pager: [phone]

## 2015-05-16 NOTE — Progress Notes (Signed)
Utilization review completed.  

## 2015-05-16 NOTE — Evaluation (Signed)
Clinical/Bedside Swallow Evaluation Patient Details  Name: [name] MRN: [id number] Date of Birth: Nov 17, 1932  Today's Date: 05/16/2015 Time: SLP Start Time (ACUTE ONLY): 1155 SLP Stop Time (ACUTE ONLY): 1210 SLP Time Calculation (min) (ACUTE ONLY): 15 min  Past Medical History:  Past Medical History  Diagnosis Date  . Syncope   . COPD (chronic obstructive pulmonary disease)   . Chronic atrial fibrillation   . GERD (gastroesophageal reflux disease)   . Coronary artery disease   . Kidney stone   . CAD (coronary artery disease), native coronary artery 12/03/2014  . Thoracic ascending aortic aneurysm 12/03/2014    4.2 x 4.3 cm noted on CT scan February 2016   . SCC (squamous cell carcinoma of lung)   . Radiation 01/04/15-02/16/15    right upper chest/hilar region 60 Gy   Past Surgical History:  Past Surgical History  Procedure Laterality Date  . Appendectomy    . Cholecystectomy    . Esophageal dilation  2012  . Video bronchoscopy Bilateral 11/29/2014    Procedure: VIDEO BRONCHOSCOPY WITHOUT FLUORO;  Surgeon: Collene Gobble, MD;  Location: [place];  Service: Cardiopulmonary;  Laterality: Bilateral;  . Esophagogastroduodenoscopy N/A 11/30/2014    Procedure: ESOPHAGOGASTRODUODENOSCOPY (EGD);  Surgeon: Arta Silence, MD;  Location: [facility] ENDOSCOPY;  Service: Endoscopy;  Laterality: N/A;   HPI:  Pt is an 81 y.o. male with PMH of lung cancer (SCC, s/p of radiation and chemotherapy), COPD, GERD, CAD, thoracic ascending aortic aneurysm, atrial fibrillation on Xarelto, who presents with shortness of breath 8/9. Milds cough with clear mucous production. CXR showed RUL and RML interstitial and airspace opacity, suspecting an enlarging LLL pulmonary nodule. Bedside swallow eval ordered to rule out aspiration.    Assessment / Plan / Recommendation Clinical Impression  Pt showed no overt s/s of aspiration at bedside during meal (salad- multi-texture, thin liquid). Swallow appeared timely,  no apparent difficulty with initiation. Pt reported occasional globus sensation (none during evaluation)- reported that this happens primarily with thick meats. Pt with hx of esophageal stretching; pt questioned to self whether he might need procedure again. May want to consider further esophageal assessment. Reviewed reflux precautions with pt- small bites/ sips, sit upright 30-60 minutes after meal, alternate food with liquid. Pt would benefit from intermittent supervision during meals to cue for these strategies. Recommend continuing regular diet/ thin liquids, meds whole with liquid. Aspiration risk appears mild. SLP will sign off at this time; please re-consult if needs arise.     Aspiration Risk  Mild    Diet Recommendation Age appropriate regular solids;Thin   Medication Administration: Whole meds with liquid Compensations: Slow rate;Small sips/bites;Follow solids with liquid    Other  Recommendations Recommended Consults: Consider esophageal assessment Oral Care Recommendations: Oral care BID   Follow Up Recommendations       Frequency and Duration        Pertinent Vitals/Pain none    SLP Swallow Goals     Swallow Study Prior Functional Status  Type of Home: House Available Help at Discharge: Family;Available PRN/intermittently    General Other Pertinent Information: Pt is an 81 y.o. male with PMH of lung cancer (SCC, s/p of radiation and chemotherapy), COPD, GERD, CAD, thoracic ascending aortic aneurysm, atrial fibrillation on Xarelto, who presents with shortness of breath 8/9. Milds cough with clear mucous production. CXR showed RUL and RML interstitial and airspace opacity, suspecting an enlarging LLL pulmonary nodule. Bedside swallow eval ordered to rule out aspiration.  Type of Study: Bedside  swallow evaluation Diet Prior to this Study: Regular;Thin liquids Temperature Spikes Noted: No Respiratory Status: Supplemental O2 delivered via (comment) History of Recent  Intubation: No Behavior/Cognition: Alert;Cooperative;Pleasant mood Oral Cavity - Dentition: Other (Comment) (dentures, top and bottom) Self-Feeding Abilities: Able to feed self Patient Positioning: Upright in chair/Tumbleform Baseline Vocal Quality: Hoarse Volitional Cough: Strong    Oral/Motor/Sensory Function Overall Oral Motor/Sensory Function: Appears within functional limits for tasks assessed Labial ROM: Within Functional Limits Labial Symmetry: Within Functional Limits Labial Strength: Within Functional Limits Lingual ROM: Within Functional Limits Lingual Symmetry: Within Functional Limits Lingual Strength: Within Functional Limits   Ice Chips Ice chips: Not tested   Thin Liquid Thin Liquid: Within functional limits Presentation: Cup    Nectar Thick Nectar Thick Liquid: Not tested   Honey Thick Honey Thick Liquid: Not tested   Puree Puree: Not tested   Solid   GO    Solid: Within functional limits Presentation: Self Patton Salles, Cydney Alvarenga K, MA, CCC-SLP 05/16/2015,12:20 PM [phone]

## 2015-05-17 LAB — BASIC METABOLIC PANEL
Anion gap: 9 (ref 5–15)
BUN: 12 mg/dL (ref 6–20)
CO2: 25 mmol/L (ref 22–32)
CREATININE: 1.1 mg/dL (ref 0.61–1.24)
Calcium: 8.5 mg/dL — ABNORMAL LOW (ref 8.9–10.3)
Chloride: 106 mmol/L (ref 101–111)
GFR calc non Af Amer: >60 mL/min (ref >60)
Glucose, Bld: 82 mg/dL (ref 65–99)
POTASSIUM: 3.6 mmol/L (ref 3.5–5.1)
Sodium: 140 mmol/L (ref 135–145)

## 2015-05-17 LAB — CBC
HEMATOCRIT: 30.7 % — AB (ref 39.0–52.0)
HEMOGLOBIN: 9.5 g/dL — AB (ref 13.0–17.0)
MCH: 26.8 pg (ref 26.0–34.0)
MCHC: 30.9 g/dL (ref 30.0–36.0)
MCV: 86.7 fL (ref 78.0–100.0)
Platelets: 155 10*3/uL (ref 150–400)
RBC: 3.54 MIL/uL — AB (ref 4.22–5.81)
RDW: 15.4 % (ref 11.5–15.5)
WBC: 5.6 10*3/uL (ref 4.0–10.5)

## 2015-05-17 MED ORDER — LEVOFLOXACIN 500 MG PO TABS
500.0000 mg | ORAL_TABLET | Freq: Every day | ORAL | Status: DC
  Administered 2015-05-17: 500 mg via ORAL
  Filled 2015-05-17 (×2): qty 1

## 2015-05-17 NOTE — Progress Notes (Signed)
SATURATION QUALIFICATIONS: (This note is used to comply with regulatory documentation for home oxygen)  Patient Saturations on Room Air at Rest = 96%  Patient Saturations on Room Air while Ambulating = 86%  Patient Saturations on 2 Liters of oxygen while Ambulating = 99%  Please briefly explain why patient needs home oxygen: patient having very SOB when ambulating without oxygen and he cannot walk that far without oxygen as well

## 2015-05-17 NOTE — Evaluation (Addendum)
Occupational Therapy Evaluation Patient Details Name: [name] MRN: [id number] DOB: 1933-04-17 Today's Date: 05/17/2015    History of Present Illness Pt is an 81 y/o male with a PMH of lung CA, COPD, GERD, CAD, thoracic ascending aortic aneurysm, a-fib, who presents with SOB and worsening DOE. Pt was found to have CAP and was admitted for further evaluation.    Clinical Impression   Pt admitted with above. He demonstrates the below listed deficits and will benefit from continued OT to maximize safety and independence with BADLs.  Pt currently requires supervision for ADLs.  02 sats decreased to 85% on 3L supplemental 02 while engaged in simple ADLs.  Sats rebounded to 94% after ~ 1 min seated rest break.   Will follow acutely.       Follow Up Recommendations  No OT follow up;Supervision - Intermittent    Equipment Recommendations  None recommended by OT    Recommendations for Other Services       Precautions / Restrictions Precautions Precautions: Fall      Mobility Bed Mobility Overal bed mobility: Modified Independent                Transfers Overall transfer level: Modified independent                    Balance Overall balance assessment: No apparent balance deficits (not formally assessed)                                          ADL Overall ADL's : Needs assistance/impaired Eating/Feeding: Independent   Grooming: Wash/dry hands;Wash/dry face;Oral care;Brushing hair;Modified independent;Standing   Upper Body Bathing: Set up;Sitting   Lower Body Bathing: Supervison/ safety;Sit to/from stand   Upper Body Dressing : Set up;Sitting   Lower Body Dressing: Supervision/safety;Sit to/from stand   Toilet Transfer: Supervision/safety;Ambulation;Comfort height toilet;RW   Toileting- Clothing Manipulation and Hygiene: Supervision/safety;Sit to/from stand       Functional mobility during ADLs: Supervision/safety General  ADL Comments: Reviewed energy conservation techniques with pt and how to modify ADLs as needed      Vision Additional Comments: Pt has low vision and has been seen at low vision clinic at [place]      Pertinent Vitals/Pain Pain Assessment: No/denies pain     Hand Dominance Right   Extremity/Trunk Assessment Upper Extremity Assessment Upper Extremity Assessment: Overall WFL for tasks assessed   Lower Extremity Assessment Lower Extremity Assessment: Overall WFL for tasks assessed   Cervical / Trunk Assessment Cervical / Trunk Assessment: Normal   Communication Communication Communication: No difficulties   Cognition Arousal/Alertness: Awake/alert Behavior During Therapy: WFL for tasks assessed/performed Overall Cognitive Status: Within Functional Limits for tasks assessed                     General Comments       Exercises Exercises: Other exercises Other Exercises Other Exercises: Pt instructed in bil UE exercises in conjunction with deep breathing to increase endurance    Shoulder Instructions      Home Living Family/patient expects to be discharged to:: Private residence Living Arrangements: Other relatives (granddaughter) Available Help at Discharge: Family;Available PRN/intermittently Type of Home: House Home Access: Stairs to enter Entrance Stairs-Number of Steps: 3   Home Layout: One level     Bathroom Shower/Tub: Walk-in shower  Bathroom Toilet: Standard     Home Equipment: Environmental consultant - 2 wheels;Cane - single point;Shower seat;Toilet riser          Prior Functioning/Environment Level of Independence: Independent        Comments: Does not drive due to low vision. Is able to go out to the grocery store with granddaughter and rides in the motorized cart.     OT Diagnosis: Generalized weakness   OT Problem List: Decreased strength;Cardiopulmonary status limiting activity   OT Treatment/Interventions:  Self-care/ADL training;Therapeutic exercise;Energy conservation;Patient/family education    OT Goals(Current goals can be found in the care plan section) Acute Rehab OT Goals Patient Stated Goal: go home OT Goal Formulation: With patient Time For Goal Achievement: 05/31/15 Potential to Achieve Goals: Good ADL Goals Pt Will Perform Upper Body Bathing: with modified independence;standing Pt Will Perform Lower Body Bathing: with modified independence;sit to/from stand Pt Will Perform Upper Body Dressing: with modified independence;standing Pt Will Perform Lower Body Dressing: with modified independence;sit to/from stand Pt Will Transfer to Toilet: with modified independence;ambulating;regular height toilet;grab bars Pt Will Perform Toileting - Clothing Manipulation and hygiene: with modified independence;sit to/from stand Pt Will Perform Tub/Shower Transfer: Shower transfer;with modified independence;ambulating Additional ADL Goal #1: Pt will participate in 25 mins therapeutic activity with no more than 2 rest breaks and 02 sats at least 90%  OT Frequency: Min 2X/week   Barriers to D/C:            Co-evaluation              End of Session Equipment Utilized During Treatment: Oxygen  Activity Tolerance: Patient limited by fatigue Patient left: in bed;with call bell/phone within reach   Time: 1600-1633 OT Time Calculation (min): 33 min Charges:  OT General Charges $OT Visit: 1 Procedure OT Evaluation $Initial OT Evaluation Tier I: 1 Procedure OT Treatments $Self Care/Home Management : 8-22 mins G-Codes:    Dorn Hartshorne M 2015/06/08, 9:30 PM

## 2015-05-17 NOTE — Discharge Instructions (Signed)

## 2015-05-17 NOTE — Progress Notes (Signed)
[facility] PROGRESS NOTE  [name] AXK:[id number] DOB: April 17, 1933 DOA: 05/15/2015 PCP: [name], MD  Assessment/Plan: Community acquired pneumonia -improving, change Po levaquin today -Cultures negative so far -speech eval completed -wean O2, Ambulatory O2 sats  Atrial Fibrillation:  -CHA2DS2-VASc Score is 3 -rate controlled -Continue Xarelto, Cardizem  COPD: -stable, DuoNeb, albuterolNeb prn for SOB  SCC (squamous cell carcinoma of lung): followed up by Dr. Julien Nordmann. Last seen was on 03/26/15. Per Dr. Julien Nordmann, pt has stage IIIA (T3, N1, M0) non-small cell lung cancer, squamous cell carcinoma, diagnosed in February 2016. He is s/p of radiation therapy and chemotherapy.  -follow up with Dr. Julien Nordmann.  CAD: stable, continue ASA  Thoracic ascending aortic aneurysm: stable.  -Size has no change since 11/2014, currently 4.3 cm by CTA -follow up with Cardiology/CVTS  GERD: -Pepcid  DVT ppx: On Xarelto  Code Status: Full Code Family Communication: none at bedside Disposition Plan: home tomorrow  HPI/Subjective: Breathing improving, feels almost back to baseline  Objective: Filed Vitals:   05/17/15 0457  BP: 100/65  Pulse: 81  Temp: 97.8 F (36.6 C)  Resp: 18    Intake/Output Summary (Last 24 hours) at 05/17/15 1353 Last data filed at 05/17/15 0855  Gross per 24 hour  Intake    576 ml  Output    450 ml  Net    126 ml   Filed Weights   05/15/15 1647 05/16/15 0249 05/17/15 0457  Weight: 87.998 kg (194 lb) 83.547 kg (184 lb 3 oz) 87.181 kg (192 lb 3.2 oz)    Exam:   General:  AAOx3, no distress  Cardiovascular: S1S2/RRR  Respiratory: ronchi at bases, improved  Abdomen: soft, Nt, BS present  Musculoskeletal: no edema c/c   Data Reviewed: Basic Metabolic Panel:  Recent Labs Lab 05/15/15 1700 05/15/15 1841 05/17/15 0516  NA 142 144 140  K 3.8 3.6 3.6  CL 108 106 106  CO2 26  --  25  GLUCOSE 85 82 82  BUN '15 20 12   '$ CREATININE 1.16 1.10 1.10  CALCIUM 9.3  --  8.5*   Liver Function Tests:  Recent Labs Lab 05/15/15 1700  AST 22  ALT 10*  ALKPHOS 92  BILITOT 0.5  PROT 6.5  ALBUMIN 2.9*   No results for input(s): LIPASE, AMYLASE in the last 168 hours. No results for input(s): AMMONIA in the last 168 hours. CBC:  Recent Labs Lab 05/15/15 1700 05/15/15 1841 05/17/15 0516  WBC 6.5  --  5.6  NEUTROABS 5.0  --   --   HGB 11.0* 11.6* 9.5*  HCT 35.6* 34.0* 30.7*  MCV 87.5  --  86.7  PLT 161  --  155   Cardiac Enzymes:  Recent Labs Lab 05/16/15 0058  TROPONINI <0.03   BNP (last 3 results)  Recent Labs  11/25/14 1220 05/15/15 1650 05/16/15 0458  BNP 119.9* 123.1* 268.1*    ProBNP (last 3 results) No results for input(s): PROBNP in the last 8760 hours.  CBG: No results for input(s): GLUCAP in the last 168 hours.  Recent Results (from the past 240 hour(s))  Blood culture (routine x 2)     Status: None (Preliminary result)   Collection Time: 05/15/15  9:27 PM  Result Value Ref Range Status   Specimen Description BLOOD LEFT ARM  Final   Special Requests BOTTLES DRAWN AEROBIC AND ANAEROBIC 10CC  Final   Culture NO GROWTH 2 DAYS  Final   Report Status PENDING  Incomplete  Blood  culture (routine x 2)     Status: None (Preliminary result)   Collection Time: 05/15/15 10:25 PM  Result Value Ref Range Status   Specimen Description BLOOD LEFT HAND  Final   Special Requests BOTTLES DRAWN AEROBIC ONLY 10CC  Final   Culture NO GROWTH 2 DAYS  Final   Report Status PENDING  Incomplete     Studies: Dg Chest 2 View  05/15/2015   CLINICAL DATA:  Two day history of shortness of breath. History of asthma, COPD and lung cancer.  EXAM: CHEST  2 VIEW  COMPARISON:  Chest x-ray 11/26/2014 and chest CT 03/19/2015.  FINDINGS: Significant interval decrease in size of the right hilar mass. There is persistent right upper lobe and right middle lobe interstitial and airspace opacity. Some of this  appears to be scarring change and interstitial lung disease. There is also likely some component of radiation change. Stable emphysematous changes. Suspect an enlarging left lower lobe pulmonary nodule. No pleural effusion or pneumothorax.  IMPRESSION: Probable combination of interstitial lung disease, emphysema and radiation fibrosis involving the right lung.  Possible enlarging left basilar pulmonary nodule.   Electronically Signed   By: Marijo Sanes M.D.   On: 05/15/2015 17:37   Ct Angio Chest Pe W/cm &/or Wo Cm  05/15/2015   CLINICAL DATA:  Shortness of breath.  EXAM: CT ANGIOGRAPHY CHEST WITH CONTRAST  TECHNIQUE: Multidetector CT imaging of the chest was performed using the standard protocol during bolus administration of intravenous contrast. Multiplanar CT image reconstructions and MIPs were obtained to evaluate the vascular anatomy.  CONTRAST:  129m OMNIPAQUE IOHEXOL 350 MG/ML SOLN  COMPARISON:  CT scan of March 19, 2015.  FINDINGS: No pneumothorax is noted. Right pleural effusion is significantly enlarged compared to prior exam. Emphysematous disease is noted throughout both lungs. New interstitial and airspace opacity is noted in the right lower lobe concerning for pneumonia. 3.0 x 2.6 cm right suprahilar mass is noted consistent with malignancy; this is not significantly changed compared to prior exam. Stable 6 mm nodule is noted in left lower lobe laterally. Right lung nodules noted on prior exam are not visualized due to previously described inflammatory disease. Stable 5 mm nodule is noted laterally in right lower lobe.  No definite evidence of pulmonary embolus is noted. Atherosclerosis of thoracic aorta is noted without dissection. 4.3 cm ascending thoracic aortic aneurysm is noted. Coronary artery calcifications are noted. No mediastinal adenopathy is noted. Visualized portion of upper abdomen is unremarkable.  Review of the MIP images confirms the above findings.  IMPRESSION: No evidence of  pulmonary embolus.  4.3 cm ascending thoracic aortic aneurysm is noted.  Interval development of large airspace and interstitial opacity in right lower lobe most consistent with pneumonia. Increased right pleural effusion is noted as well.  Stable 3 cm right suprahilar mass is noted concerning for malignancy. Stable bilateral pulmonary nodules are noted.   Electronically Signed   By: JMarijo Conception M.D.   On: 05/15/2015 21:17    Scheduled Meds: . aspirin  324 mg Oral Daily  . budesonide-formoterol  2 puff Inhalation BID  . dextromethorphan-guaiFENesin  1 tablet Oral BID  . diltiazem  180 mg Oral BID  . famotidine  20 mg Oral QHS  . furosemide  40 mg Oral Daily  . ipratropium-albuterol  3 mL Nebulization BID  . levofloxacin  500 mg Oral Daily  . rivaroxaban  20 mg Oral Q supper  . zolpidem  10 mg Oral QHS  Continuous Infusions:  Antibiotics Given (last 72 hours)    Date/Time Action Medication Dose Rate   05/16/15 0946 Given   vancomycin (VANCOCIN) IVPB 750 mg/150 ml premix 750 mg 150 mL/hr   05/16/15 2040 Given   vancomycin (VANCOCIN) IVPB 750 mg/150 ml premix 750 mg 150 mL/hr   05/17/15 0949 Given   vancomycin (VANCOCIN) IVPB 750 mg/150 ml premix 750 mg 150 mL/hr   05/17/15 1115 Given   levofloxacin (LEVAQUIN) tablet 500 mg 500 mg       Principal Problem:   CAP (community acquired pneumonia) Active Problems:   Chronic atrial fibrillation   COPD (chronic obstructive pulmonary disease)   SCC (squamous cell carcinoma of lung)   CAD (coronary artery disease), native coronary artery   Thoracic ascending aortic aneurysm   Current use of long term anticoagulation   HCAP (healthcare-associated pneumonia)    Time spent: 51mn    Miamarie Moll  [facility] Pager [phone] If 7PM-7AM, please contact night-coverage at [email or web address], password [facility]8/08/2015, 1:53 PM  LOS: 2 days

## 2015-05-18 DIAGNOSIS — I2510 Atherosclerotic heart disease of native coronary artery without angina pectoris: Secondary | ICD-10-CM

## 2015-05-18 LAB — CBC
HCT: 30.5 % — ABNORMAL LOW (ref 39.0–52.0)
Hemoglobin: 9.5 g/dL — ABNORMAL LOW (ref 13.0–17.0)
MCH: 26.6 pg (ref 26.0–34.0)
MCHC: 31.1 g/dL (ref 30.0–36.0)
MCV: 85.4 fL (ref 78.0–100.0)
PLATELETS: 165 10*3/uL (ref 150–400)
RBC: 3.57 MIL/uL — AB (ref 4.22–5.81)
RDW: 15.4 % (ref 11.5–15.5)
WBC: 6 10*3/uL (ref 4.0–10.5)

## 2015-05-18 MED ORDER — LEVOFLOXACIN 500 MG PO TABS
500.0000 mg | ORAL_TABLET | Freq: Every day | ORAL | Status: DC
  Administered 2015-05-18: 500 mg via ORAL
  Filled 2015-05-18: qty 1

## 2015-05-18 MED ORDER — ALBUTEROL SULFATE HFA 108 (90 BASE) MCG/ACT IN AERS: 2.0000 | INHALATION_SPRAY | Freq: Four times a day (QID) | RESPIRATORY_TRACT | Status: AC | PRN

## 2015-05-18 MED ORDER — LEVOFLOXACIN 500 MG PO TABS: 500.0000 mg | ORAL_TABLET | Freq: Every day | ORAL | Status: DC

## 2015-05-18 MED ORDER — FUROSEMIDE 40 MG PO TABS
40.0000 mg | ORAL_TABLET | Freq: Every day | ORAL | Status: DC
  Filled 2015-05-18: qty 1

## 2015-05-18 MED ORDER — ALBUTEROL SULFATE HFA 108 (90 BASE) MCG/ACT IN AERS
2.0000 | INHALATION_SPRAY | Freq: Four times a day (QID) | RESPIRATORY_TRACT | Status: DC | PRN
Start: 2015-05-18 — End: 2015-05-18

## 2015-05-18 NOTE — Care Management Note (Signed)
Case Management Note Marvetta Gibbons RN, BSN Unit 2W-Case Manager [phone]  Patient Details  Name: [name] MRN: [id number] Date of Birth: Oct 09, 1932  Subjective/Objective:    Pt admitted with PNA                Action/Plan: PTA pt lived at home-   Expected Discharge Date:       05/18/15           Expected Discharge Plan:  [place]  In-House Referral:     Discharge planning Services  CM Consult  Post Acute Care Choice:  Home Health, Durable Medical Equipment Choice offered to:  Patient  DME Arranged:  Oxygen DME Agency:  [place]:  RN, PT [facility] Agency:  [place]  Status of Service:  Completed, signed off  Medicare Important Message Given:    Date Medicare IM Given:    Medicare IM give by:    Date Additional Medicare IM Given:    Additional Medicare Important Message give by:     If discussed at [place] of Stay Meetings, dates discussed:    Additional Comments:  05/18/15- pt for d/c home today- will need home 02- and [place] orders have been placed- spoke with pt and daughter at bedside- per conversation pt states that he has had home 02 in past with [facility] - wants to use them again for both DME and [facility]- referral called to [facility] with [facility] for home 02- portable tank to be delivered to room prior to discharge- also called [facility] referral to Santiago Glad with [facility]- for RN/PT-   Dahlia Client, Romeo Rabon, RN 05/18/2015, 10:23 AM

## 2015-05-18 NOTE — Discharge Summary (Signed)
Physician Discharge Summary  [name] ZOX:[id number] DOB: 23-Jan-1933 DOA: 05/15/2015  PCP: [name], MD  Admit date: 05/15/2015 Discharge date: 05/18/2015  Time spent: 45 minutes  Recommendations for Outpatient Follow-up:  1. PCP in 1 week 2. CXR in 4-6weeks  Discharge Diagnoses:  Principal Problem:   CAP (community acquired pneumonia) Active Problems:   Chronic atrial fibrillation   COPD (chronic obstructive pulmonary disease)   SCC (squamous cell carcinoma of lung)   CAD (coronary artery disease), native coronary artery   Thoracic ascending aortic aneurysm   Current use of long term anticoagulation   HCAP (healthcare-associated pneumonia)   Discharge Condition: stable  Diet recommendation: low sodium  Filed Weights   05/15/15 1647 05/16/15 0249 05/17/15 0457  Weight: 87.998 kg (194 lb) 83.547 kg (184 lb 3 oz) 87.181 kg (192 lb 3.2 oz)    History of present illness:  Chief Complaint: SOB HPI: [name] is a 81 y.o. male with PMH of lung cancer (SCC, s/p of radiation), COPD, GERD, CAD, thoracic ascending aortic aneurysm, atrial fibrillation on Xarelto, who presented with shortness of breath. Patient reported that he started having SOB since 8/8 morning, then had worsening dyspnea with exertion, mild cough with clear mucus production. He states that he recently took a long car trip to [place]. He states that he has been compliant to Xarelto without bleeding tendency. He has chronic leg swelling right greater than left which is unchanged. He is taking lasix 40 mg daily for his leg edema. He said that he dose not have CHF.   Hospital Course:  Community acquired pneumonia -improving, change Po levaquin today -Cultures negative so far -speech eval completed -wean O2, Ambulatory O2 sats  Atrial Fibrillation:  -CHA2DS2-VASc Score is 3 -rate controlled -Continue Xarelto, Cardizem  COPD: -stable, DuoNeb, albuterolNeb prn for SOB  SCC (squamous cell  carcinoma of lung): followed up by Dr. Julien Nordmann. Last seen was on 03/26/15. Per Dr. Julien Nordmann, pt has stage IIIA (T3, N1, M0) non-small cell lung cancer, squamous cell carcinoma, diagnosed in February 2016. He is s/p of radiation therapy and chemotherapy.  -follow up with Dr. Julien Nordmann.  CAD: stable, continue ASA  Thoracic ascending aortic aneurysm: stable.  -Size has no change since 11/2014, currently 4.3 cm by CTA -follow up with Cardiology/CVTS  GERD: -Pepcid    Discharge Exam: Filed Vitals:   05/18/15 1050  BP: 90/60  Pulse:   Temp:   Resp:     General: AAOx3 Cardiovascular: S1S2/RRR Respiratory: CTAB  Discharge Instructions   Discharge Instructions    Diet - low sodium heart healthy    Complete by:  As directed      Increase activity slowly    Complete by:  As directed           Discharge Medication List as of 05/18/2015 10:20 AM    START taking these medications   Details  levofloxacin (LEVAQUIN) 500 MG tablet Take 1 tablet (500 mg total) by mouth daily. For 4 days, Starting 05/18/2015, Until Discontinued, Normal      CONTINUE these medications which have CHANGED   Details  albuterol (PROAIR HFA) 108 (90 BASE) MCG/ACT inhaler Inhale 2 puffs into the lungs every 6 (six) hours as needed for wheezing or shortness of breath., Starting 05/18/2015, Until Discontinued, Normal      CONTINUE these medications which have NOT CHANGED   Details  acetaminophen (TYLENOL) 500 MG tablet Take 1,500 mg by mouth daily as needed (pain)., Until Discontinued, Historical Med  budesonide-formoterol (SYMBICORT) 160-4.5 MCG/ACT inhaler Inhale 2 puffs into the lungs 2 (two) times daily., Until Discontinued, Historical Med    diltiazem (CARDIZEM) 90 MG tablet Take 180 mg by mouth 2 (two) times daily. , Until Discontinued, Historical Med    famotidine (PEPCID) 20 MG tablet Take 20 mg by mouth at bedtime., Until Discontinued, Historical Med    furosemide (LASIX) 40 MG tablet Take 0.5  tablets (20 mg total) by mouth daily., Starting 12/01/2014, Until Discontinued, No Print    hydrOXYzine (ATARAX/VISTARIL) 10 MG tablet Take 10-20 mg by mouth every 6 (six) hours as needed for itching. , Until Discontinued, Historical Med    ibuprofen (ADVIL,MOTRIN) 200 MG tablet Take 200 mg by mouth daily as needed (pain)., Until Discontinued, Historical Med    polyethylene glycol (MIRALAX / GLYCOLAX) packet Take 17 g by mouth daily as needed (constipation). Mix in 8 oz of liquid and drink, Until Discontinued, Historical Med    rivaroxaban (XARELTO) 20 MG TABS tablet Take 20 mg by mouth daily with supper., Until Discontinued, Historical Med    zolpidem (AMBIEN) 10 MG tablet Take 10 mg by mouth at bedtime. , Until Discontinued, Historical Med    pantoprazole (PROTONIX) 40 MG tablet Take 1 tablet (40 mg total) by mouth 2 (two) times daily., Starting 12/01/2014, Until Discontinued, Normal    prochlorperazine (COMPAZINE) 10 MG tablet Take 1 tablet (10 mg total) by mouth every 6 (six) hours as needed for nausea or vomiting., Starting 01/04/2015, Until Discontinued, Normal       Allergies  Allergen Reactions  . Codeine Itching   Follow-up Information    Follow up with BURNETT,BRENT A, MD. Schedule an appointment as soon as possible for a visit in 1 week.   Specialty:  Family Medicine   Contact information:   [address] [place] [address] [phone]       Follow up with [facility].   Why:  home 02 arranged- portable tank to be delivered to room prior to discharge   Contact information:   [address] [place] [address] [phone]       Follow up with [facility].   Why:  HH-RN/PT arranged   Contact information:   [address] [place] [address] [phone]        The results of significant diagnostics from this hospitalization (including imaging, microbiology, ancillary and laboratory) are  listed below for reference.    Significant Diagnostic Studies: Dg Chest 2 View  05/15/2015   CLINICAL DATA:  Two day history of shortness of breath. History of asthma, COPD and lung cancer.  EXAM: CHEST  2 VIEW  COMPARISON:  Chest x-ray 11/26/2014 and chest CT 03/19/2015.  FINDINGS: Significant interval decrease in size of the right hilar mass. There is persistent right upper lobe and right middle lobe interstitial and airspace opacity. Some of this appears to be scarring change and interstitial lung disease. There is also likely some component of radiation change. Stable emphysematous changes. Suspect an enlarging left lower lobe pulmonary nodule. No pleural effusion or pneumothorax.  IMPRESSION: Probable combination of interstitial lung disease, emphysema and radiation fibrosis involving the right lung.  Possible enlarging left basilar pulmonary nodule.   Electronically Signed   By: Marijo Sanes M.D.   On: 05/15/2015 17:37   Ct Angio Chest Pe W/cm &/or Wo Cm  05/15/2015   CLINICAL DATA:  Shortness of breath.  EXAM: CT ANGIOGRAPHY  CHEST WITH CONTRAST  TECHNIQUE: Multidetector CT imaging of the chest was performed using the standard protocol during bolus administration of intravenous contrast. Multiplanar CT image reconstructions and MIPs were obtained to evaluate the vascular anatomy.  CONTRAST:  186m OMNIPAQUE IOHEXOL 350 MG/ML SOLN  COMPARISON:  CT scan of March 19, 2015.  FINDINGS: No pneumothorax is noted. Right pleural effusion is significantly enlarged compared to prior exam. Emphysematous disease is noted throughout both lungs. New interstitial and airspace opacity is noted in the right lower lobe concerning for pneumonia. 3.0 x 2.6 cm right suprahilar mass is noted consistent with malignancy; this is not significantly changed compared to prior exam. Stable 6 mm nodule is noted in left lower lobe laterally. Right lung nodules noted on prior exam are not visualized due to previously described  inflammatory disease. Stable 5 mm nodule is noted laterally in right lower lobe.  No definite evidence of pulmonary embolus is noted. Atherosclerosis of thoracic aorta is noted without dissection. 4.3 cm ascending thoracic aortic aneurysm is noted. Coronary artery calcifications are noted. No mediastinal adenopathy is noted. Visualized portion of upper abdomen is unremarkable.  Review of the MIP images confirms the above findings.  IMPRESSION: No evidence of pulmonary embolus.  4.3 cm ascending thoracic aortic aneurysm is noted.  Interval development of large airspace and interstitial opacity in right lower lobe most consistent with pneumonia. Increased right pleural effusion is noted as well.  Stable 3 cm right suprahilar mass is noted concerning for malignancy. Stable bilateral pulmonary nodules are noted.   Electronically Signed   By: JMarijo Conception M.D.   On: 05/15/2015 21:17    Microbiology: Recent Results (from the past 240 hour(s))  Blood culture (routine x 2)     Status: None (Preliminary result)   Collection Time: 05/15/15  9:27 PM  Result Value Ref Range Status   Specimen Description BLOOD LEFT ARM  Final   Special Requests BOTTLES DRAWN AEROBIC AND ANAEROBIC 10CC  Final   Culture NO GROWTH 3 DAYS  Final   Report Status PENDING  Incomplete  Blood culture (routine x 2)     Status: None (Preliminary result)   Collection Time: 05/15/15 10:25 PM  Result Value Ref Range Status   Specimen Description BLOOD LEFT HAND  Final   Special Requests BOTTLES DRAWN AEROBIC ONLY 10CC  Final   Culture NO GROWTH 3 DAYS  Final   Report Status PENDING  Incomplete     Labs: Basic Metabolic Panel:  Recent Labs Lab 05/15/15 1700 05/15/15 1841 05/17/15 0516  NA 142 144 140  K 3.8 3.6 3.6  CL 108 106 106  CO2 26  --  25  GLUCOSE 85 82 82  BUN '15 20 12  '$ CREATININE 1.16 1.10 1.10  CALCIUM 9.3  --  8.5*   Liver Function Tests:  Recent Labs Lab 05/15/15 1700  AST 22  ALT 10*  ALKPHOS 92   BILITOT 0.5  PROT 6.5  ALBUMIN 2.9*   No results for input(s): LIPASE, AMYLASE in the last 168 hours. No results for input(s): AMMONIA in the last 168 hours. CBC:  Recent Labs Lab 05/15/15 1700 05/15/15 1841 05/17/15 0516 05/18/15 0343  WBC 6.5  --  5.6 6.0  NEUTROABS 5.0  --   --   --   HGB 11.0* 11.6* 9.5* 9.5*  HCT 35.6* 34.0* 30.7* 30.5*  MCV 87.5  --  86.7 85.4  PLT 161  --  155 165   Cardiac Enzymes:  Recent Labs Lab 05/16/15 0058  TROPONINI <0.03   BNP: BNP (last 3 results)  Recent Labs  11/25/14 1220 05/15/15 1650 05/16/15 0458  BNP 119.9* 123.1* 268.1*    ProBNP (last 3 results) No results for input(s): PROBNP in the last 8760 hours.  CBG: No results for input(s): GLUCAP in the last 168 hours.     SignedDomenic Polite  [facility] 05/18/2015, 3:12 PM

## 2015-05-18 NOTE — Care Management Important Message (Signed)
Important Message  Patient Details  Name: BRIGGS EDELEN MRN: [id number] Date of Birth: 04-May-1933   Medicare Important Message Given:  Yes-second notification given    [name] May 05/18/2015, 12:28 [place] Message  Patient Details  Name: DAMEON SOLTIS MRN: [id number] Date of Birth: [birth year]   Medicare Important Message Given:  Yes-second notification given    [name] May 05/18/2015, 12:28 PM

## 2015-05-20 LAB — CULTURE, BLOOD (ROUTINE X 2)
CULTURE: NO GROWTH
Culture: NO GROWTH

## 2015-05-31 ENCOUNTER — Other Ambulatory Visit (INDEPENDENT_AMBULATORY_CARE_PROVIDER_SITE_OTHER): Payer: Medicare Other

## 2015-05-31 ENCOUNTER — Encounter: Payer: Self-pay | Admitting: Internal Medicine

## 2015-05-31 ENCOUNTER — Ambulatory Visit (INDEPENDENT_AMBULATORY_CARE_PROVIDER_SITE_OTHER)
Admission: RE | Admit: 2015-05-31 | Discharge: 2015-05-31 | Disposition: A | Discharge status: Home / Self Care | Payer: Medicare Other | Source: Ambulatory Visit | Attending: Internal Medicine | Admitting: Internal Medicine

## 2015-05-31 ENCOUNTER — Ambulatory Visit (INDEPENDENT_AMBULATORY_CARE_PROVIDER_SITE_OTHER): Payer: Medicare Other | Admitting: Internal Medicine

## 2015-05-31 VITALS — BP 104/70 | HR 91 | Ht 70.0 in | Wt 188.4 lb

## 2015-05-31 DIAGNOSIS — J90 Pleural effusion, not elsewhere classified: Secondary | ICD-10-CM | POA: Insufficient documentation

## 2015-05-31 DIAGNOSIS — R918 Other nonspecific abnormal finding of lung field: Secondary | ICD-10-CM

## 2015-05-31 DIAGNOSIS — R0600 Dyspnea, unspecified: Secondary | ICD-10-CM

## 2015-05-31 DIAGNOSIS — E0590 Thyrotoxicosis, unspecified without thyrotoxic crisis or storm: Secondary | ICD-10-CM

## 2015-05-31 DIAGNOSIS — J9611 Chronic respiratory failure with hypoxia: Secondary | ICD-10-CM | POA: Diagnosis not present

## 2015-05-31 DIAGNOSIS — C3491 Malignant neoplasm of unspecified part of right bronchus or lung: Secondary | ICD-10-CM

## 2015-05-31 LAB — BASIC METABOLIC PANEL
BUN: 41 mg/dL — AB (ref 6–23)
CALCIUM: 9.2 mg/dL (ref 8.4–10.5)
CO2: 30 meq/L (ref 19–32)
CREATININE: 1.13 mg/dL (ref 0.40–1.50)
Chloride: 102 mEq/L (ref 96–112)
GFR: 66.33 mL/min (ref >60.00)
Glucose, Bld: 108 mg/dL — ABNORMAL HIGH (ref 70–99)
Potassium: 4.4 mEq/L (ref 3.5–5.1)
Sodium: 139 mEq/L (ref 135–145)

## 2015-05-31 LAB — CBC WITH DIFFERENTIAL/PLATELET
BASOS ABS: 0 10*3/uL (ref 0.0–0.1)
Basophils Relative: 0 % (ref 0.0–3.0)
EOS ABS: 0 10*3/uL (ref 0.0–0.7)
Eosinophils Relative: 0 % (ref 0.0–5.0)
HCT: 35.6 % — ABNORMAL LOW (ref 39.0–52.0)
Hemoglobin: 11.2 g/dL — ABNORMAL LOW (ref 13.0–17.0)
LYMPHS ABS: 0.6 10*3/uL — AB (ref 0.7–4.0)
Lymphocytes Relative: 5.3 % — ABNORMAL LOW (ref 12.0–46.0)
MCHC: 31.5 g/dL (ref 30.0–36.0)
MCV: 81 fl (ref 78.0–100.0)
Monocytes Absolute: 0.4 10*3/uL (ref 0.1–1.0)
Monocytes Relative: 3.3 % (ref 3.0–12.0)
Neutro Abs: 9.7 10*3/uL — ABNORMAL HIGH (ref 1.4–7.7)
Neutrophils Relative %: 91.4 % — ABNORMAL HIGH (ref 43.0–77.0)
PLATELETS: 255 10*3/uL (ref 150.0–400.0)
RBC: 4.39 Mil/uL (ref 4.22–5.81)
RDW: 17.3 % — ABNORMAL HIGH (ref 11.5–15.5)
WBC: 10.6 10*3/uL — ABNORMAL HIGH (ref 4.0–10.5)

## 2015-05-31 LAB — TSH: TSH: 0.21 u[IU]/mL — ABNORMAL LOW (ref 0.35–4.50)

## 2015-05-31 LAB — BRAIN NATRIURETIC PEPTIDE: Pro B Natriuretic peptide (BNP): 91 pg/mL (ref 0.0–100.0)

## 2015-05-31 LAB — SEDIMENTATION RATE: SED RATE: 36 mm/h — AB (ref 0–22)

## 2015-05-31 NOTE — Patient Instructions (Addendum)
Please remember to go to the xray  department downstairs for your tests - we will call you with the results when they are available.  Ok to take extra lasix as needed for leg swelling as per your heart doctor's recs  Prednisone 20 mg daily until breathing better then 10 mg daily thereafter Add f/u with [facility] in 2 weeks - I placed labs for thyroid in the computer

## 2015-05-31 NOTE — Progress Notes (Signed)
Subjective:     Patient ID: [name], Patrick   DOB: [birth year],     MRN: [id number]  HPI  78 yowm quit smoking around 2010 admit [facility] got better on symbicort 160 2bid and did fine until Feb 2016> admit [facility] with pna and 11/29/14 FOB ([facility] with complete obst RUL with sq cell ca  > RT and chemo and did fine back on symbicort then readmitted May 15 2015 with doe x 3 days > admitted [facility] > treated as pna but no better> referred by Dr Marlyn Corporal 05/31/2015 to pulmonary clinic  Last oncology eval 03/26/15 DIAGNOSIS: Stage IIIA (T3, N1, M0) non-small cell lung cancer, squamous cell carcinoma with positive PDL 1 expression, presented with large right upper lobe lung mass with hilar lymphadenopathy diagnosed in February 2016.  PRIOR THERAPY: Concurrent chemoradiation with weekly carboplatin for AUC of 2 and paclitaxel 45 MG/M2. Last dose 02/12/2015 with partial response.  CURRENT THERAPY: Observation.   Admit date: 05/15/2015 Discharge date: 05/18/2015   Discharge Diagnoses:  Principal Problem:  CAP (community acquired pneumonia) Active Problems:  Chronic atrial fibrillation  COPD (chronic obstructive pulmonary disease)  SCC (squamous cell carcinoma of lung)  CAD (coronary artery disease), native coronary artery  Thoracic ascending aortic aneurysm  Current use of long term anticoagulation  HCAP (healthcare-associated pneumonia)     05/31/2015 1st [facility] office visit/ Wert   Chief Complaint  Patient presents with  . Pulmonary Consult    Referred by Dr. Jerilynn Birkenhead. Pt c/o DOE while in [place] 05/15/15 and got worse when he returned. He states he gets SOB walking approx 10 ft. He has occ cough that is non prod.   sob and cough  some better from prednisone started since admit by Dr Tollie Pizza plus maint on symbicrot 160 2bid  Ok sitting / supine / can't do 50 ft flat and slow even with 02 3lpm 24/7   No obvious day to day or daytime variability or assoc cp or chest  tightness, subjective wheeze or overt sinus or hb symptoms. No unusual exp hx or h/o childhood pna/ asthma or knowledge of premature birth.  Sleeping ok without nocturnal  or early am exacerbation  of respiratory  c/o's or need for noct saba. Also denies any obvious fluctuation of symptoms with weather or environmental changes or other aggravating or alleviating factors except as outlined above   Current Medications, Allergies, Complete Past Medical History, Past Surgical History, Family History, and Social History were reviewed in [employer] record.  ROS  The following are not active complaints unless bolded sore throat, dysphagia, dental problems, itching, sneezing,  nasal congestion or excess/ purulent secretions, ear ache,   fever, chills, sweats, unintended wt loss, classically pleuritic or exertional cp, hemoptysis,  orthopnea pnd or leg swelling, presyncope, palpitations, abdominal pain, anorexia, nausea, vomiting, diarrhea  or change in bowel or bladder habits, change in stools or urine, dysuria,hematuria,  rash, arthralgias, visual complaints, headache, numbness, weakness or ataxia or problems with walking or coordination,  change in mood/affect or memory.                Review of Systems     Objective:   Physical Exam    amb wm nad gruff voice  Wt Readings from Last 3 Encounters:  05/31/15 188 lb 6.4 oz (85.458 kg)  05/17/15 192 lb 3.2 oz (87.181 kg)  03/28/15 196 lb 12.8 oz (89.268 kg)    Vital signs reviewed    HEENT:  nl dentition, turbinates, and orophanx. Nl external ear canals without cough reflex   NECK :  without JVD/Nodes/TM/ nl carotid upstrokes bilaterally   LUNGS: no acc muscle use,  insp and exp rhonchi R > L s sign dullness   CV:  RRR  no s3 or murmur or increase in P2, no edema   ABD:  soft and nontender with nl excursion in the supine position. No bruits or organomegaly, bowel sounds nl  MS:  warm without deformities, calf  tenderness, cyanosis or clubbing  SKIN: warm and dry without lesions    NEURO:  alert, approp, no deficits    CXR PA and Lateral:   05/31/2015 :     I personally reviewed images and agree with radiology impression as follows:     Interval increased density especially posteriorly in the right lower lung worrisome for progressive pneumonia. Persistent right suprahilar mass consistent with known malignancy.   Labs ordered/ reviewed:   Lab 05/31/15 1705  NA 139  K 4.4  CL 102  CO2 30  BUN 41*  CREATININE 1.13  GLUCOSE 108*    Lab 05/31/15 1705  HGB 11.2*  HCT 35.6*  WBC 10.6*  PLT 255.0     Lab Results  Component Value Date   TSH 0.21* 05/31/2015     Lab Results  Component Value Date   PROBNP 91.0 05/31/2015     Lab Results  Component Value Date   ESRSEDRATE 36* 05/31/2015      Assessment:

## 2015-06-01 ENCOUNTER — Other Ambulatory Visit: Payer: Self-pay | Admitting: Internal Medicine

## 2015-06-01 ENCOUNTER — Encounter: Payer: Self-pay | Admitting: Internal Medicine

## 2015-06-01 DIAGNOSIS — R918 Other nonspecific abnormal finding of lung field: Secondary | ICD-10-CM | POA: Insufficient documentation

## 2015-06-01 DIAGNOSIS — J9610 Chronic respiratory failure, unspecified whether with hypoxia or hypercapnia: Secondary | ICD-10-CM | POA: Insufficient documentation

## 2015-06-01 DIAGNOSIS — R7989 Other specified abnormal findings of blood chemistry: Secondary | ICD-10-CM | POA: Insufficient documentation

## 2015-06-01 DIAGNOSIS — E0590 Thyrotoxicosis, unspecified without thyrotoxic crisis or storm: Secondary | ICD-10-CM

## 2015-06-01 HISTORY — DX: Thyrotoxicosis, unspecified without thyrotoxic crisis or storm: E05.90

## 2015-06-01 HISTORY — DX: Chronic respiratory failure, unspecified whether with hypoxia or hypercapnia: J96.10

## 2015-06-01 MED ORDER — PREDNISONE 10 MG PO TABS: ORAL_TABLET | ORAL | Status: DC

## 2015-06-01 NOTE — Assessment & Plan Note (Signed)
-   05/31/2015   Walked 3lpm x one lap @ 185 stopped due to sob with sats still 94% slow pace    rx as of 05/31/2015 = 3lpm 24/7

## 2015-06-01 NOTE — Assessment & Plan Note (Signed)
Mild by tsh (or he he has sick euthyroid syndrome)   rec repeat Total T4, T3 uptake and free T3

## 2015-06-01 NOTE — Assessment & Plan Note (Signed)
See CT chest 05/15/15 > f/u cxr 05/31/2015 s sign effusion

## 2015-06-01 NOTE — Progress Notes (Signed)
Quick Note:  Spoke with pt's daughter and notified of results per Dr. Melvyn Novas. Pt verbalized understanding and denied any questions.  ______

## 2015-06-01 NOTE — Assessment & Plan Note (Signed)
No evidence of recurrent carcinoma. I suspect most of the changes we are seeing in the right lung  are related to radiation

## 2015-06-01 NOTE — Assessment & Plan Note (Signed)
Onset of acute symptoms 05/2015 - rx as HCAP initially - 05/31/2015 ESR = 36 on prednisone since 05/24/15   He is artery been treated for healthcare associated pneumonia appropriately but has persistent infiltrates in the right lung and underlying severe COPD by previous spirometry. He now remains oxygen dependent at 3 L/m and severely deconditioned.  Most likely the residual changes are either related to organizing pneumonia from acute lung injury or radiation. Also possibly has residual cancer in the right lung but I don't believe this is the cause of his acute deterioration. For now the best treatment therefore is to continue prednisone.  The goal with a chronic steroid dependent illness is always arriving at the lowest effective dose that controls the disease/symptoms and not accepting a set "formula" which is based on statistics or guidelines that don't always take into account patient  variability or the natural hx of the dz in every individual patient, which may well vary over time.  For now therefore I recommend the patient maintain  20 mg daily until better then 10 mg daily until seen > Dr Lamonte Sakai is his pulmonologist of record and will need to re-eval his airways at some point so will set up f/u ov

## 2015-06-01 NOTE — Progress Notes (Signed)
Quick Note:  lmtcb ______ 

## 2015-06-01 NOTE — Assessment & Plan Note (Addendum)
Spirometry 09/12/2005  FEV11.05 with ratio 27  Dyspnea is multifactorial but mostly related to radiation pneumonitis at this point more than likely although he certainly could've had a pneumonia in the right lung that is slow to heal.

## 2015-06-04 ENCOUNTER — Other Ambulatory Visit (INDEPENDENT_AMBULATORY_CARE_PROVIDER_SITE_OTHER): Payer: Medicare Other

## 2015-06-04 DIAGNOSIS — E0590 Thyrotoxicosis, unspecified without thyrotoxic crisis or storm: Secondary | ICD-10-CM | POA: Diagnosis not present

## 2015-06-04 LAB — T4: T4 TOTAL: 5.5 ug/dL (ref 4.5–12.0)

## 2015-06-04 LAB — T3, FREE: T3, Free: 2.4 pg/mL (ref 2.3–4.2)

## 2015-06-04 LAB — T3 UPTAKE: T3 UPTAKE: 36 % — AB (ref 22–35)

## 2015-06-05 NOTE — Progress Notes (Signed)
Quick Note:  Spoke with pt and notified of results per Dr. Wert. Pt verbalized understanding and denied any questions.  ______ 

## 2015-06-15 ENCOUNTER — Ambulatory Visit (INDEPENDENT_AMBULATORY_CARE_PROVIDER_SITE_OTHER): Payer: Medicare Other | Admitting: Internal Medicine

## 2015-06-15 ENCOUNTER — Encounter: Payer: Self-pay | Admitting: Internal Medicine

## 2015-06-15 VITALS — BP 102/60 | HR 91 | Ht 70.0 in | Wt 187.0 lb

## 2015-06-15 DIAGNOSIS — J449 Chronic obstructive pulmonary disease, unspecified: Secondary | ICD-10-CM

## 2015-06-15 DIAGNOSIS — R918 Other nonspecific abnormal finding of lung field: Secondary | ICD-10-CM

## 2015-06-15 DIAGNOSIS — Z23 Encounter for immunization: Secondary | ICD-10-CM | POA: Diagnosis not present

## 2015-06-15 DIAGNOSIS — R0600 Dyspnea, unspecified: Secondary | ICD-10-CM

## 2015-06-15 NOTE — Patient Instructions (Addendum)
symbicort 160 Take 2 puffs first thing in am and then another 2 puffs about 12 hours later.   Continue prednisone 10 mg per day but if improve ok to reduce to one half   Only use your albuterol as a rescue medication to be used if you can't catch your breath by resting or doing a relaxed purse lip breathing pattern.  - The less you use it, the better it will work when you need it. - Ok to use up to 2 puffs  every 4 hours if you must but call for immediate appointment if use goes up over your usual need - Don't leave home without it !!  (think of it like the spare tire for your car)   Please schedule a follow up office visit in 6 weeks, call sooner if needed with pfts on return to see [facility] or me

## 2015-06-15 NOTE — Progress Notes (Signed)
Subjective:     Patient ID: [name], male   DOB: [birth year]     MRN: [id number]    Brief patient profile:  82 yowm quit smoking around 2010 admit [facility] got better on symbicort 160 2bid and did fine until Feb 2016> admit [facility] with pna and 11/29/14 FOB ([facility] with complete obst RUL with sq cell ca  > RT and chemo and did fine back on symbicort then readmitted May 15 2015 with doe x 3 days > admitted [facility] > treated as pna but no better> referred by Dr Marlyn Corporal 05/31/2015 to pulmonary clinic  Last oncology eval 03/26/15 DIAGNOSIS: Stage IIIA (T3, N1, M0) non-small cell lung cancer, squamous cell carcinoma with positive PDL 1 expression, presented with large right upper lobe lung mass with hilar lymphadenopathy diagnosed in February 2016.  PRIOR THERAPY: Concurrent chemoradiation with weekly carboplatin for AUC of 2 and paclitaxel 45 MG/M2. Last dose 02/12/2015 with partial response.  CURRENT THERAPY: Observation.   Admit date: 05/15/2015 Discharge date: 05/18/2015   Discharge Diagnoses:  Principal Problem:  CAP (community acquired pneumonia) Active Problems:  Chronic atrial fibrillation  COPD (chronic obstructive pulmonary disease)  SCC (squamous cell carcinoma of lung)  CAD (coronary artery disease), native coronary artery  Thoracic ascending aortic aneurysm  Current use of long term anticoagulation  HCAP (healthcare-associated pneumonia)     05/31/2015 1st [facility] office visit/ [name]   Chief Complaint  Patient presents with  . Pulmonary Consult    Referred by Dr. Jerilynn Birkenhead. Pt c/o DOE while in [place] 05/15/15 and got worse when he returned. He states he gets SOB walking approx 10 ft. He has occ cough that is non prod.   sob and cough  some better from prednisone started since admit by Dr Tollie Pizza plus maint on symbicrot 160 2bid  Ok sitting / supine / can't do 50 ft flat and slow even with 02 3lpm 24/7  rec Please remember to go to the xray  department  downstairs for your tests - we will call you with the results when they are available. Ok to take extra lasix as needed for leg swelling as per your heart doctor's recs  Prednisone 20 mg daily until breathing better then 10 mg daily thereafter      06/15/2015 f/u ov/[name] re: copd/ pulmonary infiltrates/ sob ? RT pneumonitis  Vs hcap with ali  Chief Complaint  Patient presents with  . Follow-up    Pt states that his breathing has improved some, but not back to his normal baseline. The swelling in his legs has improved.    one day prior to OV  Decreased prednisone to 10 mg daily / no longer using 02    No obvious day to day or daytime variability or assoc cough cp or chest tightness, subjective wheeze or overt sinus or hb symptoms. No unusual exp hx or h/o childhood pna/ asthma or knowledge of premature birth.  Sleeping ok without nocturnal  or early am exacerbation  of respiratory  c/o's or need for noct saba. Also denies any obvious fluctuation of symptoms with weather or environmental changes or other aggravating or alleviating factors except as outlined above   Current Medications, Allergies, Complete Past Medical History, Past Surgical History, Family History, and Social History were reviewed in [employer] record.  ROS  The following are not active complaints unless bolded sore throat, dysphagia, dental problems, itching, sneezing,  nasal congestion or excess/ purulent secretions, ear ache,  fever, chills, sweats, unintended wt loss, classically pleuritic or exertional cp, hemoptysis,  orthopnea pnd or leg swelling, presyncope, palpitations, abdominal pain, anorexia, nausea, vomiting, diarrhea  or change in bowel or bladder habits, change in stools or urine, dysuria,hematuria,  rash, arthralgias, visual complaints, headache, numbness, weakness or ataxia or problems with walking or coordination,  change in mood/affect or memory.                     Objective:    Physical Exam    amb wm nad gruff voice   06/15/2015          187  Wt Readings from Last 3 Encounters:  05/31/15 188 lb 6.4 oz (85.458 kg)  05/17/15 192 lb 3.2 oz (87.181 kg)  03/28/15 196 lb 12.8 oz (89.268 kg)    Vital signs reviewed    HEENT: nl dentition, turbinates, and orophanx. Nl external ear canals without cough reflex   NECK :  without JVD/Nodes/TM/ nl carotid upstrokes bilaterally   LUNGS: no acc muscle use,   Minimal insp / exp rhonchi, overall air movement adequate    CV:  RRR  no s3 or murmur or increase in P2, no edema   ABD:  soft and nontender with nl excursion in the supine position. No bruits or organomegaly, bowel sounds nl  MS:  warm without deformities, calf tenderness, cyanosis or clubbing  SKIN: warm and dry without lesions    NEURO:  alert, approp, no deficits    CXR PA and Lateral:   05/31/2015 :     I personally reviewed images and agree with radiology impression as follows:     Interval increased density especially posteriorly in the right lower lung worrisome for progressive pneumonia. Persistent right suprahilar mass consistent with known malignancy.   Labs   reviewed:   Lab 05/31/15 1705  NA 139  K 4.4  CL 102  CO2 30  BUN 41*  CREATININE 1.13  GLUCOSE 108*    Lab 05/31/15 1705  HGB 11.2*  HCT 35.6*  WBC 10.6*  PLT 255.0     Lab Results  Component Value Date   TSH 0.21* 05/31/2015     Lab Results  Component Value Date   PROBNP 91.0 05/31/2015     Lab Results  Component Value Date   ESRSEDRATE 36* 05/31/2015      Assessment:

## 2015-06-16 NOTE — Assessment & Plan Note (Addendum)
-   06/15/2015   Walked RA x one lap @ 185 stopped due to back/ legs not sob or desat at slow pace   Does not appear to require 02 at this point

## 2015-06-16 NOTE — Assessment & Plan Note (Addendum)
Onset of acute symptoms 05/2015 - rx as HCAP initially - 05/31/2015 ESR = 36 on prednisone since 05/24/15   Can't r/o component of steroid resp ali or RT pneumonitis but clinically clearing improving. The goal with a chronic steroid dependent illness is always arriving at the lowest effective dose that controls the disease/symptoms and not accepting a set "formula" which is based on statistics or guidelines that don't always take into account patient  variability or the natural hx of the dz in every individual patient, which may well vary over time.  For now therefore I recommend the patient maintain on 10 mg per day (as just weaned from 20) but as improve then try 5 mg daily   F/u with CT chest due on 06/25/15 and go from there:  F/u Dr Lamonte Sakai

## 2015-06-16 NOTE — Assessment & Plan Note (Signed)
The proper method of use, as well as anticipated side effects, of a metered-dose inhaler are discussed and demonstrated to the patient. Improved effectiveness after extensive coaching during this visit to a level of approximately  90% from a basline of 75%  Clinically his copd is mild and does not appear to be limiting him as he is very sedentary and very slow paced  Strongly doubt accuracy of   previous spirometry  rx symbicort 160 2bid and prednisone low dose =>  no limiting sob so rec reduce the dose to 5 mg pred daily if tol and f/u with pfts next ? Add lama if worse sob  as taper off pred which is the goal

## 2015-06-25 ENCOUNTER — Ambulatory Visit (HOSPITAL_COMMUNITY)
Admission: RE | Admit: 2015-06-25 | Discharge: 2015-06-25 | Disposition: A | Discharge status: Home / Self Care | Payer: Medicare Other | Source: Ambulatory Visit | Attending: Internal Medicine | Admitting: Internal Medicine

## 2015-06-25 ENCOUNTER — Other Ambulatory Visit (HOSPITAL_BASED_OUTPATIENT_CLINIC_OR_DEPARTMENT_OTHER): Payer: Medicare Other

## 2015-06-25 ENCOUNTER — Encounter (HOSPITAL_COMMUNITY): Payer: Self-pay

## 2015-06-25 DIAGNOSIS — Z9221 Personal history of antineoplastic chemotherapy: Secondary | ICD-10-CM | POA: Diagnosis not present

## 2015-06-25 DIAGNOSIS — N281 Cyst of kidney, acquired: Secondary | ICD-10-CM | POA: Insufficient documentation

## 2015-06-25 DIAGNOSIS — I2510 Atherosclerotic heart disease of native coronary artery without angina pectoris: Secondary | ICD-10-CM | POA: Diagnosis not present

## 2015-06-25 DIAGNOSIS — C3491 Malignant neoplasm of unspecified part of right bronchus or lung: Secondary | ICD-10-CM

## 2015-06-25 DIAGNOSIS — R0602 Shortness of breath: Secondary | ICD-10-CM | POA: Insufficient documentation

## 2015-06-25 DIAGNOSIS — I700 Atherosclerosis of aorta: Secondary | ICD-10-CM | POA: Insufficient documentation

## 2015-06-25 DIAGNOSIS — Z923 Personal history of irradiation: Secondary | ICD-10-CM | POA: Diagnosis not present

## 2015-06-25 LAB — CBC WITH DIFFERENTIAL/PLATELET
BASO%: 0.2 % (ref 0.0–2.0)
BASOS ABS: 0 10*3/uL (ref 0.0–0.1)
EOS ABS: 0.1 10*3/uL (ref 0.0–0.5)
EOS%: 0.6 % (ref 0.0–7.0)
HEMATOCRIT: 36.1 % — AB (ref 38.4–49.9)
HEMOGLOBIN: 11.3 g/dL — AB (ref 13.0–17.1)
LYMPH%: 4.8 % — ABNORMAL LOW (ref 14.0–49.0)
MCH: 24.9 pg — AB (ref 27.2–33.4)
MCHC: 31.3 g/dL — ABNORMAL LOW (ref 32.0–36.0)
MCV: 79.7 fL (ref 79.3–98.0)
MONO#: 0.6 10*3/uL (ref 0.1–0.9)
MONO%: 5.7 % (ref 0.0–14.0)
NEUT#: 9.1 10*3/uL — ABNORMAL HIGH (ref 1.5–6.5)
NEUT%: 88.7 % — ABNORMAL HIGH (ref 39.0–75.0)
Platelets: 181 10*3/uL (ref 140–400)
RBC: 4.54 10*6/uL (ref 4.20–5.82)
RDW: 18.7 % — AB (ref 11.0–14.6)
WBC: 10.3 10*3/uL (ref 4.0–10.3)
lymph#: 0.5 10*3/uL — ABNORMAL LOW (ref 0.9–3.3)

## 2015-06-25 LAB — COMPREHENSIVE METABOLIC PANEL (CC13)
ALT: 17 U/L (ref 0–55)
ANION GAP: 7 meq/L (ref 3–11)
AST: 12 U/L (ref 5–34)
Albumin: 3.1 g/dL — ABNORMAL LOW (ref 3.5–5.0)
Alkaline Phosphatase: 71 U/L (ref 40–150)
BUN: 28.7 mg/dL — ABNORMAL HIGH (ref 7.0–26.0)
CHLORIDE: 103 meq/L (ref 98–109)
CO2: 29 meq/L (ref 22–29)
CREATININE: 1.2 mg/dL (ref 0.7–1.3)
Calcium: 9.4 mg/dL (ref 8.4–10.4)
EGFR: 60 mL/min/{1.73_m2} — ABNORMAL LOW (ref >90)
Glucose: 96 mg/dl (ref 70–140)
Potassium: 4 mEq/L (ref 3.5–5.1)
SODIUM: 139 meq/L (ref 136–145)
Total Bilirubin: 0.32 mg/dL (ref 0.20–1.20)
Total Protein: 6.4 g/dL (ref 6.4–8.3)

## 2015-06-25 MED ORDER — IOHEXOL 300 MG/ML  SOLN
75.0000 mL | Freq: Once | INTRAMUSCULAR | Status: AC | PRN
  Administered 2015-06-25: 75 mL via INTRAVENOUS

## 2015-07-02 ENCOUNTER — Ambulatory Visit (HOSPITAL_BASED_OUTPATIENT_CLINIC_OR_DEPARTMENT_OTHER): Payer: Medicare Other | Admitting: Internal Medicine

## 2015-07-02 ENCOUNTER — Telehealth: Payer: Self-pay | Admitting: Internal Medicine

## 2015-07-02 ENCOUNTER — Encounter: Payer: Self-pay | Admitting: Internal Medicine

## 2015-07-02 VITALS — BP 103/63 | HR 105 | Temp 98.1°F | Resp 17 | Ht 70.0 in | Wt 189.1 lb

## 2015-07-02 DIAGNOSIS — J449 Chronic obstructive pulmonary disease, unspecified: Secondary | ICD-10-CM | POA: Diagnosis not present

## 2015-07-02 DIAGNOSIS — C3491 Malignant neoplasm of unspecified part of right bronchus or lung: Secondary | ICD-10-CM

## 2015-07-02 NOTE — Telephone Encounter (Signed)
Gave and printed appt sched and avs fo rpt for OCT and DEc

## 2015-07-02 NOTE — Progress Notes (Signed)
[place] [phone]   [phone]  OFFICE PROGRESS NOTE  BURNETT,BRENT A, MD [address] [place] [address]  DIAGNOSIS: Stage IIIA (T3, N1, M0) non-small cell lung cancer, squamous cell carcinoma with positive PDL 1 expression, presented with large right upper lobe lung mass with hilar lymphadenopathy diagnosed in February 2016.  PRIOR THERAPY: Concurrent chemoradiation with weekly carboplatin for AUC of 2 and paclitaxel 45 MG/M2. Last dose 02/12/2015 with partial response.  CURRENT THERAPY: Observation.  INTERVAL HISTORY: [name] 81 y.o. male returns to the clinic today for follow-up visit accompanied by his daughter and granddaughter. The patient continues to have significant shortness of breath with minimal exertion. He was seen recently by Dr. Melvyn Novas and started on prednisone for radiation induced pneumonitis in addition to inhaler for his history of COPD. Has been observation for the last few months with no other significant complaints. He denied having any significant chest pain or hemoptysis. The patient denied having any significant weight loss or night sweats. He has no fever or chills, no nausea or vomiting. He had repeat CT scan of the chest performed recently and he is here for evaluation and discussion of his scan results and treatment options.  MEDICAL HISTORY: Past Medical History  Diagnosis Date  . Syncope   . COPD (chronic obstructive pulmonary disease)   . Chronic atrial fibrillation   . GERD (gastroesophageal reflux disease)   . Coronary artery disease   . Kidney stone   . CAD (coronary artery disease), native coronary artery 12/03/2014  . Thoracic ascending aortic aneurysm 12/03/2014    4.2 x 4.3 cm noted on CT scan February 2016   . SCC (squamous cell carcinoma of lung)   . Radiation 01/04/15-02/16/15    right upper chest/hilar region 60 Gy    ALLERGIES:  is allergic to codeine.  MEDICATIONS:    Current Outpatient Prescriptions  Medication Sig Dispense Refill  . albuterol (PROAIR HFA) 108 (90 BASE) MCG/ACT inhaler Inhale 2 puffs into the lungs every 6 (six) hours as needed for wheezing or shortness of breath. 1 Inhaler 0  . budesonide-formoterol (SYMBICORT) 160-4.5 MCG/ACT inhaler Inhale 2 puffs into the lungs 2 (two) times daily.    Marland Kitchen dexamethasone (DECADRON) 4 MG/ML injection Dexamethasone Sodium Phosphate 4 MG/ML Injection Solution 21m IM now  Refills: 0   Burnett M.D., [facility];  Start 24-May-2015 Admin Requested    . dextromethorphan-guaiFENesin (MUCINEX DM) 30-600 MG per 12 hr tablet Take 1 tablet by mouth daily.    . digoxin (LANOXIN) 0.125 MG tablet Take by mouth.    . diltiazem (CARDIZEM) 90 MG tablet 2 every am and 1 at bedtime    . furosemide (LASIX) 40 MG tablet Take 40 mg by mouth daily.    . hydrOXYzine (ATARAX/VISTARIL) 10 MG tablet Take by mouth.    .Marland Kitchenomeprazole (PRILOSEC OTC) 20 MG tablet Take by mouth.    . potassium chloride SA (KLOR-CON M20) 20 MEQ tablet Take by mouth.    . predniSONE (DELTASONE) 10 MG tablet 2 tablets daily until better then 1 tablet daily 60 tablet 1  . prochlorperazine (COMPAZINE) 10 MG tablet Take by mouth.    . rivaroxaban (XARELTO) 20 MG TABS tablet Take 20 mg by mouth daily with supper.    . triamcinolone cream (KENALOG) 0.1 % Triamcinolone Acetonide 0.1 % External Cream APPLY 2-3 TIMES DAILY TO AFFECTED AREA(S).  Quantity: 1;  Refills: 0  Tollie Pizza M.D., [place] ;  [place] 26-Feb-2015 Active 454 GM Jar    . zolpidem (AMBIEN) 10 MG tablet Take 10 mg by mouth at bedtime.      No current facility-administered medications for this visit.    SURGICAL HISTORY:  Past Surgical History  Procedure Laterality Date  . Appendectomy    . Cholecystectomy    . Esophageal dilation  2012  . Video bronchoscopy Bilateral 11/29/2014    Procedure: VIDEO BRONCHOSCOPY WITHOUT FLUORO;  Surgeon: Collene Gobble, MD;  Location: [facility];  Service:  Cardiopulmonary;  Laterality: Bilateral;  . Esophagogastroduodenoscopy N/A 11/30/2014    Procedure: ESOPHAGOGASTRODUODENOSCOPY (EGD);  Surgeon: Arta Silence, MD;  Location: [facility] ENDOSCOPY;  Service: Endoscopy;  Laterality: N/A;    REVIEW OF SYSTEMS:  Constitutional: positive for fatigue Eyes: negative Ears, nose, mouth, throat, and face: negative Respiratory: positive for dyspnea on exertion Cardiovascular: negative Gastrointestinal: negative Genitourinary:negative Integument/breast: negative Hematologic/lymphatic: negative Musculoskeletal:negative Neurological: negative Behavioral/Psych: negative Endocrine: negative Allergic/Immunologic: negative   PHYSICAL EXAMINATION: General appearance: alert, cooperative, fatigued and no distress Head: Normocephalic, without obvious abnormality, atraumatic Neck: no adenopathy, no JVD, supple, symmetrical, trachea midline and thyroid not enlarged, symmetric, no tenderness/mass/nodules Lymph nodes: Cervical, supraclavicular, and axillary nodes normal. Resp: clear to auscultation bilaterally Back: symmetric, no curvature. ROM normal. No CVA tenderness. Cardio: regular rate and rhythm, S1, S2 normal, no murmur, click, rub or gallop GI: soft, non-tender; bowel sounds normal; no masses,  no organomegaly Extremities: extremities normal, atraumatic, no cyanosis or edema Neurologic: Alert and oriented X 3, normal strength and tone. Normal symmetric reflexes. Normal coordination and gait  ECOG PERFORMANCE STATUS: 1 - Symptomatic but completely ambulatory  Blood pressure 103/63, pulse 105, temperature 98.1 F (36.7 C), temperature source Oral, resp. rate 17, height '5\' 10"'$  (1.778 m), weight 189 lb 1.6 oz (85.775 kg), SpO2 98 %.  LABORATORY DATA: Lab Results  Component Value Date   WBC 10.3 06/25/2015   HGB 11.3* 06/25/2015   HCT 36.1* 06/25/2015   MCV 79.7 06/25/2015   PLT 181 06/25/2015      Chemistry      Component Value Date/Time   NA 139  06/25/2015 0941   NA 139 05/31/2015 1705   K 4.0 06/25/2015 0941   K 4.4 05/31/2015 1705   CL 102 05/31/2015 1705   CO2 29 06/25/2015 0941   CO2 30 05/31/2015 1705   BUN 28.7* 06/25/2015 0941   BUN 41* 05/31/2015 1705   CREATININE 1.2 06/25/2015 0941   CREATININE 1.13 05/31/2015 1705      Component Value Date/Time   CALCIUM 9.4 06/25/2015 0941   CALCIUM 9.2 05/31/2015 1705   ALKPHOS 71 06/25/2015 0941   ALKPHOS 92 05/15/2015 1700   AST 12 06/25/2015 0941   AST 22 05/15/2015 1700   ALT 17 06/25/2015 0941   ALT 10* 05/15/2015 1700   BILITOT 0.32 06/25/2015 0941   BILITOT 0.5 05/15/2015 1700       RADIOGRAPHIC STUDIES: Ct Chest W Contrast  06/25/2015   CLINICAL DATA:  Restaging right lung cancer, completed chemotherapy. Recently finished radiation therapy. Shortness of breath.  EXAM: CT CHEST WITH CONTRAST  TECHNIQUE: Multidetector CT imaging of the chest was performed during intravenous contrast administration.  CONTRAST:  12m OMNIPAQUE IOHEXOL 300 MG/ML  SOLN  COMPARISON:  05/15/2015  FINDINGS: Mediastinum/Nodes: Right upper lobe suprahilar central mass measures 3.5 x 2.0 cm image 23, decreased from my repeat measurement of 3.2 x 2.3 cm. Stable degree of right lower lobe peribronchial thickening.  Sub carinal node is also smaller, now 0.5 cm compared to 0.7 cm previously.  Heart size is normal.  No pericardial effusion.  Severe atheromatous aortic calcification as well as coronary arterial calcification.  Calcified subcarinal node again noted.  Lungs/Pleura: Decrease in now small right partly loculated pleural effusion.  Diffuse emphysematous change noted. Subjective interval decrease in right perihilar linear pulmonary parenchymal opacity with internal bronchiectasis most compatible with radiation fibrosis, for example image 39.  Irregular right upper lobe pulmonary nodule measures 1.1 cm image 23, not significantly changed.  Upper abdomen: 2.5 cm left upper renal pole cortical cyst  image 64. Hepatic granulomas incidentally noted. Cholecystectomy clips are noted. Renal cortical thinning reidentified bilaterally. No adrenal mass.  Musculoskeletal: No lytic or sclerotic osseous lesion. No acute osseous abnormality.  IMPRESSION: Decreased size of right upper lobe suprahilar mass likely indicating response to treatment.  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  Stable findings otherwise without new acute finding or new evidence for intrathoracic metastatic disease.   Electronically Signed   By: Conchita Paris M.D.   On: 06/25/2015 12:20    ASSESSMENT AND PLAN: This is a very pleasant 81 years old white male recently diagnosed with a stage IIIa non-small cell lung cancer, squamous cell carcinoma. He is status post a course of concurrent chemoradiation with weekly carboplatin and paclitaxel and tolerated his treatment fairly well. The patient has been on observation for the last few months but continues to have significant shortness of breath secondary to COPD and radiation pneumonitis. He is currently on prednisone in addition to inhalers. He will be seen by Dr. Lamonte Sakai and few weeks for reevaluation and I wonder if the patient would benefit from pulmonary rehabilitation. His recent CT scan of the chest showed further improvement in his disease. I discussed the scan results with the patient and his family. I recommended for him to continue on observation with repeat CT scan of the chest in 3 months. The patient was advised to call immediately if he has any concerning symptoms in the interval. The patient voices understanding of current disease status and treatment options and is in agreement with the current care plan.  All questions were answered. The patient knows to call the clinic with any problems, questions or concerns. We can certainly see the patient much sooner if necessary.  Disclaimer: This note was dictated with voice recognition  software. Similar sounding words can inadvertently be transcribed and may not be corrected upon review.

## 2015-07-31 ENCOUNTER — Ambulatory Visit (INDEPENDENT_AMBULATORY_CARE_PROVIDER_SITE_OTHER): Payer: Medicare Other | Admitting: Emergency Medicine

## 2015-07-31 ENCOUNTER — Encounter: Payer: Self-pay | Admitting: Emergency Medicine

## 2015-07-31 VITALS — BP 118/80 | HR 65 | Ht 68.0 in | Wt 188.0 lb

## 2015-07-31 DIAGNOSIS — J9611 Chronic respiratory failure with hypoxia: Secondary | ICD-10-CM

## 2015-07-31 DIAGNOSIS — J449 Chronic obstructive pulmonary disease, unspecified: Secondary | ICD-10-CM

## 2015-07-31 DIAGNOSIS — C3491 Malignant neoplasm of unspecified part of right bronchus or lung: Secondary | ICD-10-CM

## 2015-07-31 LAB — PULMONARY FUNCTION TEST
DL/VA % pred: 55 %
DL/VA: 2.49 ml/min/mmHg/L
DLCO UNC % PRED: 35 %
DLCO unc: 11.05 ml/min/mmHg
FEF 25-75 POST: 0.42 L/s
FEF 25-75 PRE: 0.41 L/s
FEF2575-%Change-Post: 2 %
FEF2575-%PRED-POST: 22 %
FEF2575-%Pred-Pre: 22 %
FEV1-%CHANGE-POST: 0 %
FEV1-%PRED-POST: 36 %
FEV1-%PRED-PRE: 36 %
FEV1-POST: 1 L
FEV1-Pre: 0.99 L
FEV1FVC-%CHANGE-POST: -1 %
FEV1FVC-%PRED-PRE: 62 %
FEV6-%CHANGE-POST: 0 %
FEV6-%PRED-POST: 61 %
FEV6-%Pred-Pre: 61 %
FEV6-Post: 2.21 L
FEV6-Pre: 2.19 L
FEV6FVC-%CHANGE-POST: 0 %
FEV6FVC-%Pred-Post: 104 %
FEV6FVC-%Pred-Pre: 104 %
FVC-%Change-Post: 1 %
FVC-%Pred-Post: 59 %
FVC-%Pred-Pre: 58 %
FVC-Post: 2.29 L
FVC-Pre: 2.25 L
POST FEV1/FVC RATIO: 44 %
PRE FEV6/FVC RATIO: 97 %
Post FEV6/FVC ratio: 97 %
Pre FEV1/FVC ratio: 44 %
RV % pred: 156 %
RV: 4.08 L
TLC % PRED: 98 %
TLC: 6.75 L

## 2015-07-31 MED ORDER — TIOTROPIUM BROMIDE MONOHYDRATE 18 MCG IN CAPS: 18.0000 ug | ORAL_CAPSULE | Freq: Every day | RESPIRATORY_TRACT | Status: AC

## 2015-07-31 MED ORDER — PREDNISONE 10 MG PO TABS: ORAL_TABLET | ORAL | Status: DC

## 2015-07-31 NOTE — Progress Notes (Signed)
PFT done today. 

## 2015-07-31 NOTE — Assessment & Plan Note (Signed)
Multifactorial and primarily related to his COPD/emphysema. He also has right-sided interstitial changes consistent with scar and radiation change. I believe he needs to wear oxygen with all exertion based on his previous desaturations with ambulation. Instructed  him to do this today

## 2015-07-31 NOTE — Addendum Note (Signed)
Addended by: Jerrol Banana on: 07/31/2015 04:45 PM   Modules accepted: Orders

## 2015-07-31 NOTE — Assessment & Plan Note (Signed)
Following with Dr. Mohammed 

## 2015-07-31 NOTE — Patient Instructions (Signed)
Please continue your Symbicort 2 puffs twice a day. Remember to rinse and gargle after using Start using your oxygen at 2L/min with all exertion (using portable tanks) Start Spiriva one inhalation daily Decrease your prednisone to '10mg'$  daily. Continue this dose until your next visit.  Continue to use albuterol 2 puffs up to every 4 hours as needed for shortness of breath Follow with Dr Julien Nordmann as planned.  Continue your other medications as ordered Follow with Dr Lamonte Sakai in 1 month

## 2015-07-31 NOTE — Progress Notes (Signed)
Subjective:    Patient ID: [name], male    DOB: [birth year], 81 y.o.   MRN: [id number]  HPI 81 year old former smoker (130 pack years) with a history of stage IIIa squamous cell lung cancer status post conquer and chemoradiation last dose 02/2015. His most recent CT scan of the chest was performed 06/25/15 and I reviewed the results personally. This showed decrease in size of his right upper lobe suprahilar mass and some apparently resolving radiation pneumonitis. He was seen by Dr. Melvyn Novas in the last few months and has been on Symbicort plus prednisone. He has apparently been on prednisone off and on for years (for breathing). His prednisone dose is currently on '10mg'$  bid and has been on this for 2 months. He underwent pulmonary function testing today that I personally reviewed. This shows very severe obstruction with an FEV1 of 0.99 L or 36% of predicted, no bronchodilator response, hyperinflated lung volumes, and a decreased diffusion capacity. He did not desaturate after walking one lap around the office on 05/31/15 but he has qualified for O2 before while in hospital.  He has been using albuterol frequently with some relief. He is having progressive exertional SOB, worse over the last month. Coughing some,, more over the last week.     Review of Systems As per HPI  Past Medical History  Diagnosis Date  . Syncope   . COPD (chronic obstructive pulmonary disease) ([place])   . Chronic atrial fibrillation ([place])   . GERD (gastroesophageal reflux disease)   . Coronary artery disease   . Kidney stone   . CAD (coronary artery disease), native coronary artery 12/03/2014  . Thoracic ascending aortic aneurysm ([place]) 12/03/2014    4.2 x 4.3 cm noted on CT scan February 2016   . SCC (squamous cell carcinoma of lung) ([place])   . Radiation 01/04/15-02/16/15    right upper chest/hilar region 60 Gy     Family History  Problem Relation Age of Onset  . Stroke    . Cancer    . Heart attack Sister   . Lung  cancer Daughter     smokes     Social History   Social History  . Marital Status: Widowed    Spouse Name: N/A  . Number of Children: 3  . Years of Education: N/A   Occupational History  . Not on file.   Social History Main Topics  . Smoking status: Former Smoker -- 2.00 packs/day for 65 years    Types: Cigarettes    Quit date: 12/12/2009  . Smokeless tobacco: Never Used  . Alcohol Use: No     Comment: has not drank in 4-5 years.  Has a beer every once in awhile.  . Drug Use: No  . Sexual Activity: Not on file   Other Topics Concern  . Not on file   Social History Narrative     Allergies  Allergen Reactions  . Codeine Itching     Outpatient Prescriptions Prior to Visit  Medication Sig Dispense Refill  . albuterol (PROAIR HFA) 108 (90 BASE) MCG/ACT inhaler Inhale 2 puffs into the lungs every 6 (six) hours as needed for wheezing or shortness of breath. 1 Inhaler 0  . budesonide-formoterol (SYMBICORT) 160-4.5 MCG/ACT inhaler Inhale 2 puffs into the lungs 2 (two) times daily.    Marland Kitchen dextromethorphan-guaiFENesin (MUCINEX DM) 30-600 MG per 12 hr tablet Take 1 tablet by mouth daily.    Marland Kitchen diltiazem (CARDIZEM) 90 MG tablet 2 every am and  1 at bedtime    . furosemide (LASIX) 40 MG tablet Take 40 mg by mouth daily.    . potassium chloride SA (KLOR-CON M20) 20 MEQ tablet Take 20 mEq by mouth daily.     . predniSONE (DELTASONE) 10 MG tablet 2 tablets daily until better then 1 tablet daily 60 tablet 1  . rivaroxaban (XARELTO) 20 MG TABS tablet Take 20 mg by mouth daily with supper.    . triamcinolone cream (KENALOG) 0.1 % apply as needed    . zolpidem (AMBIEN) 10 MG tablet Take 10 mg by mouth at bedtime.     Marland Kitchen dexamethasone (DECADRON) 4 MG/ML injection Dexamethasone Sodium Phosphate 4 MG/ML Injection Solution 70m IM now  Refills: 0   Burnett M.D., [place];  Start 24-May-2015 Admin Requested    . digoxin (LANOXIN) 0.125 MG tablet Take by mouth.    . hydrOXYzine (ATARAX/VISTARIL)  10 MG tablet Take by mouth.    .Marland Kitchenomeprazole (PRILOSEC OTC) 20 MG tablet Take by mouth.    . prochlorperazine (COMPAZINE) 10 MG tablet Take by mouth.     No facility-administered medications prior to visit.          Objective:   Physical Exam Filed Vitals:   07/31/15 1602  BP: 118/80  Pulse: 65  Height: '5\' 8"'$  (1.727 m)  Weight: 188 lb (85.276 kg)  SpO2: 90%   Gen: Pleasant, ill appearing, in no distress,  normal affect  ENT: No lesions,  mouth clear,  oropharynx clear, no postnasal drip  Neck: No JVD, no stridor  Lungs: No use of accessory muscles, distant, clear without rales or rhonchi, no wheeze  Cardiovascular: RRR, heart sounds normal, no murmur or gallops, B LE 1-2+ peripheral edema  Musculoskeletal: No deformities, no cyanosis or clubbing  Neuro: alert, non focal  Skin: Warm, no lesions or rashes     Assessment & Plan:  COPD Severe COPD confirmed on spirometry and based on his emphysematous changes on CT scan of the chest. He continues to have exertional dyspnea that is limiting. We will add Spiriva to his Symbicort and start oxygen with ambulation and exertion at 2 L/m. I will decrease prednisone from 20 mg daily to 10 mg daily and continue this dose until her next visit.   Chronic respiratory failure Multifactorial and primarily related to his COPD/emphysema. He also has right-sided interstitial changes consistent with scar and radiation change. I believe he needs to wear oxygen with all exertion based on his previous desaturations with ambulation. Instructed  him to do this today  SCC (squamous cell carcinoma of lung) Following with Dr. MEarlie Server

## 2015-07-31 NOTE — Assessment & Plan Note (Signed)
Severe COPD confirmed on spirometry and based on his emphysematous changes on CT scan of the chest. He continues to have exertional dyspnea that is limiting. We will add Spiriva to his Symbicort and start oxygen with ambulation and exertion at 2 L/m. I will decrease prednisone from 20 mg daily to 10 mg daily and continue this dose until her next visit.

## 2015-08-04 ENCOUNTER — Emergency Department (HOSPITAL_COMMUNITY): Payer: Medicare Other

## 2015-08-04 ENCOUNTER — Encounter (HOSPITAL_COMMUNITY): Payer: Self-pay | Admitting: Family Medicine

## 2015-08-04 ENCOUNTER — Inpatient Hospital Stay (HOSPITAL_COMMUNITY): Payer: Medicare Other

## 2015-08-04 ENCOUNTER — Telehealth: Payer: Self-pay | Admitting: Internal Medicine

## 2015-08-04 ENCOUNTER — Inpatient Hospital Stay (HOSPITAL_COMMUNITY)
Admission: EM | Admit: 2015-08-04 | Discharge: 2015-08-11 | DRG: 199 | Disposition: A | Discharge status: Home Health | Payer: Medicare Other | Attending: Emergency Medicine | Admitting: Emergency Medicine

## 2015-08-04 DIAGNOSIS — Z923 Personal history of irradiation: Secondary | ICD-10-CM | POA: Diagnosis not present

## 2015-08-04 DIAGNOSIS — C3490 Malignant neoplasm of unspecified part of unspecified bronchus or lung: Secondary | ICD-10-CM | POA: Diagnosis present

## 2015-08-04 DIAGNOSIS — Z7901 Long term (current) use of anticoagulants: Secondary | ICD-10-CM

## 2015-08-04 DIAGNOSIS — I712 Thoracic aortic aneurysm, without rupture: Secondary | ICD-10-CM | POA: Diagnosis present

## 2015-08-04 DIAGNOSIS — Z9221 Personal history of antineoplastic chemotherapy: Secondary | ICD-10-CM

## 2015-08-04 DIAGNOSIS — Z87891 Personal history of nicotine dependence: Secondary | ICD-10-CM

## 2015-08-04 DIAGNOSIS — I959 Hypotension, unspecified: Secondary | ICD-10-CM | POA: Diagnosis present

## 2015-08-04 DIAGNOSIS — Z7952 Long term (current) use of systemic steroids: Secondary | ICD-10-CM

## 2015-08-04 DIAGNOSIS — J9611 Chronic respiratory failure with hypoxia: Secondary | ICD-10-CM | POA: Diagnosis present

## 2015-08-04 DIAGNOSIS — J9383 Other pneumothorax: Secondary | ICD-10-CM | POA: Diagnosis present

## 2015-08-04 DIAGNOSIS — J9610 Chronic respiratory failure, unspecified whether with hypoxia or hypercapnia: Secondary | ICD-10-CM | POA: Diagnosis present

## 2015-08-04 DIAGNOSIS — R609 Edema, unspecified: Secondary | ICD-10-CM | POA: Diagnosis not present

## 2015-08-04 DIAGNOSIS — J939 Pneumothorax, unspecified: Secondary | ICD-10-CM | POA: Diagnosis present

## 2015-08-04 DIAGNOSIS — J9381 Chronic pneumothorax: Secondary | ICD-10-CM | POA: Diagnosis not present

## 2015-08-04 DIAGNOSIS — K219 Gastro-esophageal reflux disease without esophagitis: Secondary | ICD-10-CM | POA: Diagnosis present

## 2015-08-04 DIAGNOSIS — Y842 Radiological procedure and radiotherapy as the cause of abnormal reaction of the patient, or of later complication, without mention of misadventure at the time of the procedure: Secondary | ICD-10-CM | POA: Diagnosis present

## 2015-08-04 DIAGNOSIS — J189 Pneumonia, unspecified organism: Secondary | ICD-10-CM | POA: Diagnosis not present

## 2015-08-04 DIAGNOSIS — C3491 Malignant neoplasm of unspecified part of right bronchus or lung: Secondary | ICD-10-CM

## 2015-08-04 DIAGNOSIS — Z8709 Personal history of other diseases of the respiratory system: Secondary | ICD-10-CM

## 2015-08-04 DIAGNOSIS — J9690 Respiratory failure, unspecified, unspecified whether with hypoxia or hypercapnia: Secondary | ICD-10-CM

## 2015-08-04 DIAGNOSIS — J440 Chronic obstructive pulmonary disease with acute lower respiratory infection: Secondary | ICD-10-CM | POA: Diagnosis present

## 2015-08-04 DIAGNOSIS — I10 Essential (primary) hypertension: Secondary | ICD-10-CM | POA: Diagnosis present

## 2015-08-04 DIAGNOSIS — J180 Bronchopneumonia, unspecified organism: Secondary | ICD-10-CM | POA: Diagnosis present

## 2015-08-04 DIAGNOSIS — R600 Localized edema: Secondary | ICD-10-CM | POA: Diagnosis present

## 2015-08-04 DIAGNOSIS — J449 Chronic obstructive pulmonary disease, unspecified: Secondary | ICD-10-CM | POA: Diagnosis not present

## 2015-08-04 DIAGNOSIS — I482 Chronic atrial fibrillation, unspecified: Secondary | ICD-10-CM | POA: Diagnosis present

## 2015-08-04 DIAGNOSIS — C3411 Malignant neoplasm of upper lobe, right bronchus or lung: Secondary | ICD-10-CM | POA: Diagnosis not present

## 2015-08-04 DIAGNOSIS — Z9689 Presence of other specified functional implants: Secondary | ICD-10-CM | POA: Insufficient documentation

## 2015-08-04 DIAGNOSIS — J700 Acute pulmonary manifestations due to radiation: Secondary | ICD-10-CM | POA: Diagnosis present

## 2015-08-04 DIAGNOSIS — R0600 Dyspnea, unspecified: Secondary | ICD-10-CM | POA: Diagnosis present

## 2015-08-04 DIAGNOSIS — J9311 Primary spontaneous pneumothorax: Secondary | ICD-10-CM

## 2015-08-04 DIAGNOSIS — I2510 Atherosclerotic heart disease of native coronary artery without angina pectoris: Secondary | ICD-10-CM | POA: Diagnosis present

## 2015-08-04 DIAGNOSIS — Z789 Other specified health status: Secondary | ICD-10-CM | POA: Diagnosis not present

## 2015-08-04 HISTORY — DX: Chronic obstructive pulmonary disease, unspecified: J44.9

## 2015-08-04 HISTORY — DX: Pneumothorax, unspecified: J93.9

## 2015-08-04 HISTORY — DX: Chronic respiratory failure, unspecified whether with hypoxia or hypercapnia: J96.10

## 2015-08-04 HISTORY — DX: Personal history of nicotine dependence: Z87.891

## 2015-08-04 HISTORY — DX: Thyrotoxicosis, unspecified without thyrotoxic crisis or storm: E05.90

## 2015-08-04 HISTORY — DX: Acute pulmonary manifestations due to radiation: J70.0

## 2015-08-04 HISTORY — DX: Long term (current) use of anticoagulants: Z79.01

## 2015-08-04 LAB — BASIC METABOLIC PANEL
Anion gap: 10 (ref 5–15)
BUN: 35 mg/dL — ABNORMAL HIGH (ref 6–20)
CALCIUM: 9.1 mg/dL (ref 8.9–10.3)
CO2: 28 mmol/L (ref 22–32)
CREATININE: 1.41 mg/dL — AB (ref 0.61–1.24)
Chloride: 98 mmol/L — ABNORMAL LOW (ref 101–111)
GFR, EST AFRICAN AMERICAN: 53 mL/min — AB (ref >60)
GFR, EST NON AFRICAN AMERICAN: 45 mL/min — AB (ref >60)
Glucose, Bld: 115 mg/dL — ABNORMAL HIGH (ref 65–99)
Potassium: 3.8 mmol/L (ref 3.5–5.1)
SODIUM: 136 mmol/L (ref 135–145)

## 2015-08-04 LAB — I-STAT TROPONIN, ED: TROPONIN I, POC: 0.01 ng/mL (ref 0.00–0.08)

## 2015-08-04 LAB — CBC
HCT: 39.5 % (ref 39.0–52.0)
Hemoglobin: 12 g/dL — ABNORMAL LOW (ref 13.0–17.0)
MCH: 25.3 pg — ABNORMAL LOW (ref 26.0–34.0)
MCHC: 30.4 g/dL (ref 30.0–36.0)
MCV: 83.3 fL (ref 78.0–100.0)
PLATELETS: 177 10*3/uL (ref 150–400)
RBC: 4.74 MIL/uL (ref 4.22–5.81)
RDW: 18.3 % — ABNORMAL HIGH (ref 11.5–15.5)
WBC: 14.3 10*3/uL — AB (ref 4.0–10.5)

## 2015-08-04 MED ORDER — MIDAZOLAM HCL 2 MG/2ML IJ SOLN
INTRAMUSCULAR | Status: AC
  Filled 2015-08-04: qty 2

## 2015-08-04 MED ORDER — SODIUM CHLORIDE 0.9 % IV SOLN: 250.0000 mL | INTRAVENOUS | Status: DC | PRN

## 2015-08-04 MED ORDER — PREDNISONE 20 MG PO TABS
20.0000 mg | ORAL_TABLET | Freq: Every day | ORAL | Status: DC
  Administered 2015-08-05 – 2015-08-11 (×7): 20 mg via ORAL
  Filled 2015-08-04 (×8): qty 1

## 2015-08-04 MED ORDER — TIOTROPIUM BROMIDE MONOHYDRATE 18 MCG IN CAPS
18.0000 ug | ORAL_CAPSULE | Freq: Every day | RESPIRATORY_TRACT | Status: DC
  Administered 2015-08-05 – 2015-08-10 (×6): 18 ug via RESPIRATORY_TRACT
  Filled 2015-08-04 (×3): qty 5

## 2015-08-04 MED ORDER — LIDOCAINE HCL (PF) 1 % IJ SOLN
30.0000 mL | Freq: Once | INTRAMUSCULAR | Status: AC
  Administered 2015-08-04: 30 mL via INTRADERMAL
  Filled 2015-08-04: qty 30

## 2015-08-04 MED ORDER — MORPHINE SULFATE (PF) 2 MG/ML IV SOLN
INTRAVENOUS | Status: AC
  Administered 2015-08-04: 2 mg via INTRAVENOUS
  Filled 2015-08-04: qty 1

## 2015-08-04 MED ORDER — ACETAMINOPHEN 325 MG PO TABS: 650.0000 mg | ORAL_TABLET | Freq: Four times a day (QID) | ORAL | Status: DC | PRN

## 2015-08-04 MED ORDER — SODIUM CHLORIDE 0.9 % IJ SOLN
3.0000 mL | Freq: Two times a day (BID) | INTRAMUSCULAR | Status: DC
Start: 2015-08-04 — End: 2015-08-11
  Administered 2015-08-04 – 2015-08-11 (×14): 3 mL via INTRAVENOUS

## 2015-08-04 MED ORDER — POTASSIUM CHLORIDE CRYS ER 20 MEQ PO TBCR
20.0000 meq | EXTENDED_RELEASE_TABLET | Freq: Every day | ORAL | Status: DC
  Administered 2015-08-04 – 2015-08-11 (×8): 20 meq via ORAL
  Filled 2015-08-04 (×8): qty 1

## 2015-08-04 MED ORDER — FUROSEMIDE 40 MG PO TABS
40.0000 mg | ORAL_TABLET | Freq: Every day | ORAL | Status: DC
  Administered 2015-08-05 – 2015-08-11 (×7): 40 mg via ORAL
  Filled 2015-08-04 (×7): qty 1

## 2015-08-04 MED ORDER — LIDOCAINE HCL (PF) 1 % IJ SOLN
INTRAMUSCULAR | Status: AC
  Filled 2015-08-04: qty 5

## 2015-08-04 MED ORDER — BUDESONIDE-FORMOTEROL FUMARATE 160-4.5 MCG/ACT IN AERO
2.0000 | INHALATION_SPRAY | Freq: Two times a day (BID) | RESPIRATORY_TRACT | Status: DC
  Administered 2015-08-05 – 2015-08-11 (×12): 2 via RESPIRATORY_TRACT
  Filled 2015-08-04: qty 6

## 2015-08-04 MED ORDER — ALBUTEROL SULFATE (2.5 MG/3ML) 0.083% IN NEBU: 2.5000 mg | INHALATION_SOLUTION | RESPIRATORY_TRACT | Status: DC | PRN

## 2015-08-04 MED ORDER — DM-GUAIFENESIN ER 30-600 MG PO TB12
1.0000 | ORAL_TABLET | Freq: Every day | ORAL | Status: DC
  Administered 2015-08-05 – 2015-08-11 (×7): 1 via ORAL
  Filled 2015-08-04 (×7): qty 1

## 2015-08-04 MED ORDER — KETOROLAC TROMETHAMINE 15 MG/ML IJ SOLN
15.0000 mg | Freq: Four times a day (QID) | INTRAMUSCULAR | Status: AC | PRN
  Administered 2015-08-04 – 2015-08-06 (×7): 15 mg via INTRAVENOUS
  Filled 2015-08-04 (×7): qty 1

## 2015-08-04 MED ORDER — SODIUM CHLORIDE 0.9 % IJ SOLN
3.0000 mL | INTRAMUSCULAR | Status: DC | PRN
  Administered 2015-08-08: 3 mL via INTRAVENOUS
  Filled 2015-08-04: qty 3

## 2015-08-04 MED ORDER — MORPHINE SULFATE (PF) 2 MG/ML IV SOLN
INTRAVENOUS | Status: AC
  Filled 2015-08-04: qty 1

## 2015-08-04 MED ORDER — ACETAMINOPHEN 650 MG RE SUPP: 650.0000 mg | Freq: Four times a day (QID) | RECTAL | Status: DC | PRN

## 2015-08-04 MED ORDER — ZOLPIDEM TARTRATE 5 MG PO TABS
5.0000 mg | ORAL_TABLET | Freq: Every day | ORAL | Status: DC
  Administered 2015-08-04 – 2015-08-10 (×7): 5 mg via ORAL
  Filled 2015-08-04 (×7): qty 1

## 2015-08-04 MED ORDER — MIDAZOLAM HCL 5 MG/5ML IJ SOLN
INTRAMUSCULAR | Status: DC | PRN
  Administered 2015-08-04: 1 mg via INTRAVENOUS

## 2015-08-04 MED ORDER — DILTIAZEM HCL 90 MG PO TABS
90.0000 mg | ORAL_TABLET | Freq: Two times a day (BID) | ORAL | Status: DC
  Administered 2015-08-04 – 2015-08-05 (×2): 90 mg via ORAL
  Filled 2015-08-04 (×3): qty 1

## 2015-08-04 MED ORDER — MORPHINE SULFATE 10 MG/ML IJ SOLN
INTRAMUSCULAR | Status: DC | PRN
  Administered 2015-08-04: 2 mg via INTRAVENOUS

## 2015-08-04 NOTE — Consult Note (Addendum)
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  The patient originally presented with a large partially obstructing right upper lobe lung mass in February 2016. He was diagnosed with stage IIIA  (T3N1M0) squamous cell carcinoma of the lung following diagnostic bronchoscopy by Dr. Lamonte Sakai.  He was treated with concurrent chemotherapy (carboplatin and paclitaxel) and radiation therapy (60 Gy to right upper chest and hilum) with a partial response. He developed post radiation pneumonitis and for the last several months has been followed in the pulmonary medicine clinic for worsening chronic respiratory failure.  He was seen in the office of Dr. Lamonte Sakai on 07/31/2015 at which time he reported worsening DOE and cough for the last several weeks.  Spiriva was added and the patient's dose of prednisone was decreased from 20 mg to 10 mg daily.  A chest x-ray was not performed. The patient telephoned Dr. Annamaria Boots this morning complaining of further increase in shortness of breath and cough. He was instructed to increase his dose of prednisone to 40 mg daily and come to the emergency room if his breathing didn't improve.  He presented to the emergency room early this afternoon where a chest x-ray was performed demonstrating a small right pneumothorax. Cardiothoracic surgical consultation was requested.  The patient states that his breathing has gradually  deteriorated over the last 4-6 months since he underwent radiation therapy.  He now gets short of breath with minimal activity and wears oxygen continuously. He has a chronic productive cough that has increased recently. He denies any history of hemoptysis. He has had some low-grade fevers without chills. He denies any pain in his chest whatsoever and he specifically denies any history of pleuritic chest wall discomfort. He denies any recent history of trauma. He states that he has problems with bleeding diathesis and easy bruising because of long-term anticoagulation therapy for atrial fibrillation. Appetite is good. He has not lost any weight. The remainder of his review of systems is noncontributory.  Past Medical History  Diagnosis Date  . Syncope   . COPD (chronic obstructive pulmonary disease) ([place])   . Chronic atrial fibrillation ([place])   . GERD (gastroesophageal reflux disease)   . Coronary artery disease   . Kidney stone   . CAD (coronary artery disease), native coronary artery 12/03/2014  . Thoracic ascending aortic aneurysm ([place]) 12/03/2014    4.2 x 4.3 cm noted on CT scan February 2016   . Radiation 01/04/15-02/16/15    right upper chest/hilar region 60 Gy  . Chronic respiratory failure ([place]) 06/01/2015    - 05/31/2015   Walked 3lpm x one lap @ 185 stopped due to sob with sats still 94% slow pace    rx as of 05/31/2015 = 3lpm 24/7   . COPD 11/25/2014    - Spirometry 09/12/2005  FEV11.05 with ratio 27  - 06/15/2015 p extensive coaching HFA  effectiveness =    90%      . Current use of long term anticoagulation   . History of tobacco abuse 11/25/2014  . Hyperthyroidism 06/01/2015  . SCC (squamous cell carcinoma of lung) ([place]) 11/29/2014    Stage IIIA (T3N1M0) - treated with concurrent chemoradiation with weekly carboplatin for AUC of 2 and paclitaxel 45 MG/M2. Last dose 02/12/2015 with partial response  . Radiation pneumonitis [facility])     Past Surgical History  Procedure Laterality Date  .  Appendectomy    . Cholecystectomy    . Esophageal dilation  2012  . Video bronchoscopy Bilateral 11/29/2014    Procedure: VIDEO BRONCHOSCOPY WITHOUT FLUORO;  Surgeon: Collene Gobble, MD;  Location: [facility];  Service: Cardiopulmonary;  Laterality: Bilateral;  . Esophagogastroduodenoscopy N/A 11/30/2014    Procedure: ESOPHAGOGASTRODUODENOSCOPY (EGD);  Surgeon: Arta Silence, MD;  Location[facility] ENDOSCOPY;  Service: Endoscopy;  Laterality: N/A;    Family History  Problem Relation Age of Onset  . Stroke    . Cancer    . Heart attack Sister   . Lung cancer Daughter     smokes    Social History   Social History  . Marital Status: Widowed    Spouse Name: N/A  . Number of Children: 3  . Years of Education: N/A   Occupational History  . Not on file.   Social History Main Topics  . Smoking status: Former Smoker -- 2.00 packs/day for 65 years    Types: Cigarettes    Quit date: 12/12/2009  . Smokeless tobacco: Never Used  . Alcohol Use: No     Comment: has not drank in 4-5 years.  Has a beer every once in awhile.  . Drug Use: No  . Sexual Activity: Not on file   Other Topics Concern  . Not on file   Social History Narrative    Prior to Admission medications   Medication Sig Start Date End Date Taking? Authorizing Provider  albuterol (PROAIR HFA) 108 (90 BASE) MCG/ACT inhaler Inhale 2 puffs into the lungs every 6 (six) hours as needed for wheezing or shortness of breath. 05/18/15  Yes Domenic Polite, MD  budesonide-formoterol [facility]) 160-4.5 MCG/ACT inhaler Inhale 2 puffs into the lungs 2 (two) times daily.   Yes Historical Provider, MD  dextromethorphan-guaiFENesin (MUCINEX DM) 30-600 MG per 12 hr tablet Take 1 tablet by mouth daily.   Yes Historical Provider, MD  diltiazem (CARDIZEM) 90 MG tablet 2 every am and 1 at bedtime   Yes Historical Provider, MD  furosemide (LASIX) 40 MG tablet Take 40 mg by mouth daily.   Yes Historical Provider, MD  potassium chloride SA  (KLOR-CON M20) 20 MEQ tablet Take 20 mEq by mouth daily.    Yes Historical Provider, MD  predniSONE (DELTASONE) 10 MG tablet 1 tablet daily 07/31/15  Yes Collene Gobble, MD  rivaroxaban (XARELTO) 20 MG TABS tablet Take 20 mg by mouth daily with supper.   Yes Historical Provider, MD  tiotropium (SPIRIVA) 18 MCG inhalation capsule Place 1 capsule (18 mcg total) into inhaler and inhale daily. 07/31/15  Yes Collene Gobble, MD  triamcinolone cream (KENALOG) 0.1 % apply as needed 02/26/15  Yes Historical Provider, MD  zolpidem (AMBIEN) 10 MG tablet Take 10 mg by mouth at bedtime.    Yes Historical Provider, MD    No current facility-administered medications for this encounter.   Current Outpatient Prescriptions  Medication Sig Dispense Refill  . albuterol (PROAIR HFA) 108 (90  BASE) MCG/ACT inhaler Inhale 2 puffs into the lungs every 6 (six) hours as needed for wheezing or shortness of breath. 1 Inhaler 0  . budesonide-formoterol (SYMBICORT) 160-4.5 MCG/ACT inhaler Inhale 2 puffs into the lungs 2 (two) times daily.    Marland Kitchen dextromethorphan-guaiFENesin (MUCINEX DM) 30-600 MG per 12 hr tablet Take 1 tablet by mouth daily.    Marland Kitchen diltiazem (CARDIZEM) 90 MG tablet 2 every am and 1 at bedtime    . furosemide (LASIX) 40 MG tablet Take 40 mg by mouth daily.    . potassium chloride SA (KLOR-CON M20) 20 MEQ tablet Take 20 mEq by mouth daily.     . predniSONE (DELTASONE) 10 MG tablet 1 tablet daily 30 tablet 0  . rivaroxaban (XARELTO) 20 MG TABS tablet Take 20 mg by mouth daily with supper.    . tiotropium (SPIRIVA) 18 MCG inhalation capsule Place 1 capsule (18 mcg total) into inhaler and inhale daily. 30 capsule 6  . triamcinolone cream (KENALOG) 0.1 % apply as needed    . zolpidem (AMBIEN) 10 MG tablet Take 10 mg by mouth at bedtime.       Allergies  Allergen Reactions  . Codeine Itching      Review of Systems:   General:  normal appetite, decreased energy, no weight gain, no weight loss, +  fever  Cardiac:  no chest pain with exertion, no chest pain at rest, + SOB with minimal exertion, no resting SOB, no PND, no orthopnea, no palpitations, + arrhythmia, + atrial fibrillation, + LE edema, no dizzy spells, no syncope  Respiratory:  + shortness of breath, + home oxygen, + productive cough, + dry cough, + bronchitis, + wheezing, no hemoptysis, + asthma, no pain with inspiration or cough, no sleep apnea, no CPAP at night  GI:   no difficulty swallowing, no reflux, no frequent heartburn, no hiatal hernia, no abdominal pain, no constipation, no diarrhea, no hematochezia, no hematemesis, no melena  GU:   no dysuria,  no frequency, no urinary tract infection, no hematuria, no enlarged prostate, no kidney stones, no kidney disease  Vascular:  no pain suggestive of claudication, no pain in feet, no leg cramps, no varicose veins, no DVT, no non-healing foot ulcer  Neuro:   no stroke, no TIA's, no seizures, no headaches, no temporary blindness one eye,  no slurred speech, no peripheral neuropathy, no chronic pain, no instability of gait, no memory/cognitive dysfunction  Musculoskeletal: mild arthritis, no joint swelling, no myalgias, no difficulty walking, normal mobility other than severe dyspnea  Skin:   + rash, no itching, no skin infections, no pressure sores or ulcerations  Psych:   no anxiety, no depression, no nervousness, no unusual recent stress  Eyes:   no blurry vision, no floaters, no recent vision changes, + wears glasses or contacts  ENT:   no hearing loss, no loose or painful teeth  Hematologic:  + easy bruising, + abnormal bleeding, no clotting disorder, no frequent epistaxis  Endocrine:  no diabetes, does not check CBG's at home     Physical Exam:   BP 117/74 mmHg  Pulse 109  Temp(Src) 99.2 F (37.3 C) (Oral)  Resp 21  Ht '5\' 9"'$  (1.753 m)  Wt 83.207 kg (183 lb 7 oz)  BMI 27.08 kg/m2  SpO2 97%  General:  Elderly, chronically ill-appearing but breathing comfortably  NAD  HEENT:  Unremarkable  Neck:   no JVD, no bruits, no adenopathy, no subQ air  Chest:   Scattered rhonchi,  hyperresonant BS's right side, no wheezes  CV:   Irregular rate and rhythm, no murmur   Abdomen:  soft, non-tender, no masses   Extremities:  warm, well-perfused, pulses not palpable, trace lower extremity edema  Rectal/GU  Deferred  Neuro:   Grossly non-focal and symmetrical throughout  Skin:   Clean and dry, no rashes, no breakdown  Diagnostic Tests:  CT CHEST WITH CONTRAST  TECHNIQUE: Multidetector CT imaging of the chest was performed during intravenous contrast administration.  CONTRAST: 33m OMNIPAQUE IOHEXOL 300 MG/ML SOLN  COMPARISON: 05/15/2015  FINDINGS: Mediastinum/Nodes: Right upper lobe suprahilar central mass measures 3.5 x 2.0 cm image 23, decreased from my repeat measurement of 3.2 x 2.3 cm. Stable degree of right lower lobe peribronchial thickening.  Sub carinal node is also smaller, now 0.5 cm compared to 0.7 cm previously.  Heart size is normal. No pericardial effusion.  Severe atheromatous aortic calcification as well as coronary arterial calcification.  Calcified subcarinal node again noted.  Lungs/Pleura: Decrease in now small right partly loculated pleural effusion.  Diffuse emphysematous change noted. Subjective interval decrease in right perihilar linear pulmonary parenchymal opacity with internal bronchiectasis most compatible with radiation fibrosis, for example image 39.  Irregular right upper lobe pulmonary nodule measures 1.1 cm image 23, not significantly changed.  Upper abdomen: 2.5 cm left upper renal pole cortical cyst image 64. Hepatic granulomas incidentally noted. Cholecystectomy clips are noted. Renal cortical thinning reidentified bilaterally. No adrenal mass.  Musculoskeletal: No lytic or sclerotic osseous lesion. No acute osseous abnormality.  IMPRESSION: Decreased size of right upper lobe  suprahilar mass likely indicating response to treatment.  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  Stable findings otherwise without new acute finding or new evidence for intrathoracic metastatic disease.   Electronically Signed  By: GConchita ParisM.D.  On: 06/25/2015 12:20    CHEST - 2 VIEW  COMPARISON: CT 06/25/2015 and previous  FINDINGS: There is a new moderate right pneumothorax seen best laterally and posteriorly, estimated less than 25%. Coarse right infrahilar opacities are stable. Scarring or subsegmental atelectasis in the lung bases right greater than left as before. Attenuated bronchovascular markings in the left upper lobe. No definite effusion. Heart size remains normal. Atheromatous aorta. Bridging spurs across multiple levels in the mid thoracic spine as before.  IMPRESSION: 1. Right lateral pneumothorax estimated less than 25%. Critical Value/emergent results were called by telephone at the time of interpretation on 08/04/2015 at 1:28 pm to Dr. EGareth Morgan, who verbally acknowledged these results.   Electronically Signed  By: DLucrezia EuropeM.D.  On: 08/04/2015 13:28   Impression:  Patient has chronic respiratory failure that has been slowly progressing for several months and is related to underlying severe COPD and post radiation pneumonitis status post concurrent chemotherapy radiation therapy for squamous cell carcinoma of the lung. I have personally reviewed the patient's chest x-ray and most recent chest CT scan performed last month.  CT scan performed last month demonstrated the presence of severe underlying parenchymal lung disease with severe COPD, suprahilar right upper lobe lung mass with partial response to treatment, post-radiation pneumonitis, and numerous subpleural blebs.  The patient now has a small (<25%) right pneumothorax that appears at least partially loculated on plain  film chest radiograph.  At present the patient is breathing comfortably using oxygen via nasal cannula. He is chronically anticoagulated using Xarelto for atrial fibrillation.    Plan:  I have discussed the patient's clinical condition with Dr. BLamonte Sakai  We both agree that the patient would best be treated with hospital admission to either a stepdown unit or an intensive care unit for close observation. He may need antibiotics and further adjustment to his bronchodilator therapy.  We will hold all forms of pharmacologic anticoagulation therapy with anticipation that he may require chest tube placement in the near future. If the patient remains clinically stable we will delay chest tube placement to allow for the effects of Xarelto to dissipate. If the patient's respiratory status condition deteriorates to any significant degree we will proceed with urgent chest tube placement. In the meanwhile, we will obtain a CT scan of the chest without intravenous contrast to better characterize the size and location of the pneumothorax and whether or not portions of the lung appear adherent to the chest wall.  Will follow closely.   I spent in excess of 120 minutes during the conduct of this hospital consultation and >50% of this time involved direct face-to-face encounter for counseling and/or coordination of the patient's care.    Valentina Gu. Roxy Manns, MD 08/04/2015 4:08 PM

## 2015-08-04 NOTE — ED Provider Notes (Signed)
CSN: [id number]     Arrival date & time 08/04/15  1239 History   First MD Initiated Contact with Patient 08/04/15 1302     Chief Complaint  Patient presents with  . Shortness of Breath     (Consider location/radiation/quality/duration/timing/severity/associated sxs/prior Treatment) Patient is a 81 y.o. male presenting with shortness of breath.  Shortness of Breath Severity:  Severe Onset quality:  Gradual Duration: present for 3 mos, acute worsening over last 4 days. Timing:  Constant Progression:  Worsening Chronicity:  New Context: not URI   Relieved by:  Nothing Worsened by:  Nothing tried Ineffective treatments:  None tried Associated symptoms: cough (chronic, worse over last few days) and wheezing   Associated symptoms: no abdominal pain, no chest pain, no diaphoresis, no fever, no headaches, no hemoptysis, no rash, no sore throat, no syncope and no vomiting   Risk factors: no hx of PE/DVT and no prolonged immobilization     Past Medical History  Diagnosis Date  . Syncope   . COPD (chronic obstructive pulmonary disease) ([place])   . Chronic atrial fibrillation ([place])   . GERD (gastroesophageal reflux disease)   . Coronary artery disease   . Kidney stone   . CAD (coronary artery disease), native coronary artery 12/03/2014  . Thoracic ascending aortic aneurysm ([place]) 12/03/2014    4.2 x 4.3 cm noted on CT scan February 2016   . Radiation 01/04/15-02/16/15    right upper chest/hilar region 60 Gy  . Chronic respiratory failure ([place]) 06/01/2015    - 05/31/2015   Walked 3lpm x one lap @ 185 stopped due to sob with sats still 94% slow pace    rx as of 05/31/2015 = 3lpm 24/7   . COPD 11/25/2014    - Spirometry 09/12/2005  FEV11.05 with ratio 27  - 06/15/2015 p extensive coaching HFA effectiveness =    90%      . Current use of long term anticoagulation   . History of tobacco abuse 11/25/2014  . Hyperthyroidism 06/01/2015  . SCC (squamous cell carcinoma of lung) ([place]) 11/29/2014    Stage  IIIA (T3N1M0) - treated with concurrent chemoradiation with weekly carboplatin for AUC of 2 and paclitaxel 45 MG/M2. Last dose 02/12/2015 with partial response  . Radiation pneumonitis ([place])   . Pneumothorax, right 08/04/2015   Past Surgical History  Procedure Laterality Date  . Appendectomy    . Cholecystectomy    . Esophageal dilation  2012  . Video bronchoscopy Bilateral 11/29/2014    Procedure: VIDEO BRONCHOSCOPY WITHOUT FLUORO;  Surgeon: Collene Gobble, MD;  Location: [facility];  Service: Cardiopulmonary;  Laterality: Bilateral;  . Esophagogastroduodenoscopy N/A 11/30/2014    Procedure: ESOPHAGOGASTRODUODENOSCOPY (EGD);  Surgeon: Arta Silence, MD;  Location[facility] ENDOSCOPY;  Service: Endoscopy;  Laterality: N/A;   Family History  Problem Relation Age of Onset  . Stroke    . Cancer    . Heart attack Sister   . Lung cancer Daughter     smokes   Social History  Substance Use Topics  . Smoking status: Former Smoker -- 2.00 packs/day for 65 years    Types: Cigarettes    Quit date: 12/12/2009  . Smokeless tobacco: Never Used  . Alcohol Use: No     Comment: has not drank in 4-5 years.  Has a beer every once in awhile.    Review of Systems  Constitutional: Negative for fever and diaphoresis.  HENT: Negative for sore throat.   Eyes: Negative for visual disturbance.  Respiratory: Positive for cough (chronic, worse over last few days), shortness of breath and wheezing. Negative for hemoptysis.   Cardiovascular: Negative for chest pain and syncope.  Gastrointestinal: Negative for nausea, vomiting and abdominal pain.  Genitourinary: Negative for difficulty urinating.  Musculoskeletal: Negative for back pain and neck stiffness.  Skin: Negative for rash.  Neurological: Negative for syncope and headaches.      Allergies  Codeine  Home Medications   Prior to Admission medications   Medication Sig Start Date End Date Taking? Authorizing Provider  albuterol (PROAIR HFA) 108  (90 BASE) MCG/ACT inhaler Inhale 2 puffs into the lungs every 6 (six) hours as needed for wheezing or shortness of breath. 05/18/15  Yes Domenic Polite, MD  budesonide-formoterol [facility]) 160-4.5 MCG/ACT inhaler Inhale 2 puffs into the lungs 2 (two) times daily.   Yes Historical Provider, MD  dextromethorphan-guaiFENesin (MUCINEX DM) 30-600 MG per 12 hr tablet Take 1 tablet by mouth daily.   Yes Historical Provider, MD  diltiazem (CARDIZEM) 90 MG tablet 2 every am and 1 at bedtime   Yes Historical Provider, MD  furosemide (LASIX) 40 MG tablet Take 40 mg by mouth daily.   Yes Historical Provider, MD  potassium chloride SA (KLOR-CON M20) 20 MEQ tablet Take 20 mEq by mouth daily.    Yes Historical Provider, MD  predniSONE (DELTASONE) 10 MG tablet 1 tablet daily 07/31/15  Yes Collene Gobble, MD  rivaroxaban (XARELTO) 20 MG TABS tablet Take 20 mg by mouth daily with supper.   Yes Historical Provider, MD  tiotropium (SPIRIVA) 18 MCG inhalation capsule Place 1 capsule (18 mcg total) into inhaler and inhale daily. 07/31/15  Yes Collene Gobble, MD  triamcinolone cream (KENALOG) 0.1 % apply as needed 02/26/15  Yes Historical Provider, MD  zolpidem (AMBIEN) 10 MG tablet Take 10 mg by mouth at bedtime.    Yes Historical Provider, MD   BP 114/94 mmHg  Pulse 100  Temp(Src) 99.2 F (37.3 C) (Oral)  Resp 24  Ht '5\' 9"'$  (3.976 m)  Wt 183 lb 7 oz (83.207 kg)  BMI 27.08 kg/m2  SpO2 98% Physical Exam  Constitutional: He is oriented to person, place, and time. He appears well-developed and well-nourished. No distress.  HENT:  Head: Normocephalic and atraumatic.  Eyes: Conjunctivae and EOM are normal.  Neck: Normal range of motion.  Cardiovascular: Normal heart sounds and intact distal pulses.  An irregularly irregular rhythm present. Tachycardia present.  Exam reveals no gallop and no friction rub.   No murmur heard. Pulmonary/Chest: Effort normal. No respiratory distress. He has decreased breath sounds  (bilateral). He has wheezes (occasional). He has no rales.  Abdominal: Soft. He exhibits no distension. There is no tenderness. There is no guarding.  Musculoskeletal: He exhibits no edema.  Neurological: He is alert and oriented to person, place, and time.  Skin: Skin is warm and dry. He is not diaphoretic.  Nursing note and vitals reviewed.   ED Course  Procedures (including critical care time) Labs Review Labs Reviewed  BASIC METABOLIC PANEL - Abnormal; Notable for the following:    Chloride 98 (*)    Glucose, Bld 115 (*)    BUN 35 (*)    Creatinine, Ser 1.41 (*)    GFR calc non Af Amer 45 (*)    GFR calc Af Amer 53 (*)    All other components within normal limits  CBC - Abnormal; Notable for the following:    WBC 14.3 (*)  Hemoglobin 12.0 (*)    MCH 25.3 (*)    RDW 18.3 (*)    All other components within normal limits  I-STAT TROPOININ, ED    Imaging Review Dg Chest 2 View  08/04/2015  CLINICAL DATA:  Pt has lung cancer and is increasing SOB; pt is struggling to breathe and inhaler isnt helping. Pt had radiation from march to may this year and he states that ruined his breathing. Hx COPD EXAM: CHEST - 2 VIEW COMPARISON:  CT 06/25/2015 and previous FINDINGS: There is a new moderate right pneumothorax seen best laterally and posteriorly, estimated less than 25%. Coarse right infrahilar opacities are stable. Scarring or subsegmental atelectasis in the lung bases right greater than left as before. Attenuated bronchovascular markings in the left upper lobe. No definite effusion. Heart size remains normal. Atheromatous aorta. Bridging spurs across multiple levels in the mid thoracic spine as before. IMPRESSION: 1. Right lateral pneumothorax estimated less than 25%. Critical Value/emergent results were called by telephone at the time of interpretation on 08/04/2015 at 1:28 pm to Dr. Gareth Morgan , who verbally acknowledged these results. Electronically Signed   By: Lucrezia Europe M.D.    On: 08/04/2015 13:28   Ct Chest Wo Contrast  08/04/2015  ADDENDUM REPORT: 08/04/2015 17:09 ADDENDUM: Critical Value/emergent results were called by telephone at the time of interpretation on 08/04/2015 at 5:09 pm to Dr. Ellin Saba, who verbally acknowledged these results. Electronically Signed   By: Franki Cabot M.D.   On: 08/04/2015 17:09  08/04/2015  CLINICAL DATA:  Worsening shortness of breath for 1 week, denies chest pain. Pneumothorax seen on chest x-ray. Per previous CT report, patient has history of lung cancer status post radiation therapy. EXAM: CT CHEST WITHOUT CONTRAST TECHNIQUE: Multidetector CT imaging of the chest was performed following the standard protocol without IV contrast. COMPARISON:  Chest x-ray from earlier same day. Also comparison to chest CT dated 06/25/2015. FINDINGS: There is a moderate to large pneumothorax on the right, increased in size since the earlier chest x-ray. Extensive emphysematous changes noted bilaterally. Irregular mass within the right hilum measures 3.4 x 2.4 cm, similar to the size on most recent chest CT of 06/25/2015, significantly decreased in size compared to earlier chest CTs dating back to 11/26/2014. 8 mm nodular density within the right upper lobe is stable compared to CTs dating back to 11/26/2014 (image 25, series 2). 6 mm nodule within the right lower lobe is stable (image 42). 7 mm pulmonary nodule within the left lower lobe is stable (image 42). 6 mm pulmonary nodule within the left lower lobe posteriorly is stable (image 40). No new pulmonary nodules or masses seen. Atherosclerotic changes again noted along the walls of the normal-caliber thoracic aorta. Heart size is normal. Diffuse coronary artery calcifications again noted. Scattered small lymph nodes noted within the mediastinum, unchanged. Heart size is normal. No pericardial effusion. Degenerative changes noted throughout the thoracolumbar spine but no acute osseous abnormality. 7 mm stone  nonobstructing left renal stone. Several right renal stones noted, largest measuring 5 mm. No associated hydronephrosis bilaterally. Patient is status postcholecystectomy. Limited images of the upper abdomen are otherwise unremarkable. IMPRESSION: 1. Right pneumothorax, moderate to large in size, increased in size compared to the chest x-ray from earlier same day. No significant mediastinal shift seen. 2. Extensive emphysematous changes bilaterally. 3. Small right pleural effusion. 4. Irregular mass within the right perihilar/suprahilar lung, compatible with patient's known history of lung cancer, stable compared to the most recent chest  CT of 06/25/2015 but significantly decreased in size compared to the earlier CT of 11/26/2014. 5. Bilateral pulmonary nodules, suspicious for metastatic disease, but stable compared to multiple prior chest CTs dating back to 11/26/2014. 6. Diffuse coronary artery calcifications.  Heart size is normal. 7. Bilateral nephrolithiasis. Electronically Signed: By: Franki Cabot M.D. On: 08/04/2015 17:05   I have personally reviewed and evaluated these images and lab results as part of my medical decision-making.   EKG Interpretation   Date/Time:  Saturday August 04 2015 12:45:06 EDT Ventricular Rate:  112 PR Interval:    QRS Duration: 88 QT Interval:  290 QTC Calculation: 395 R Axis:   -28 Text Interpretation:  Atrial fibrillation with rapid ventricular response  Abnormal ECG No significant change since last tracing Confirmed by  [facility] MD, Andreus Cure ([id number]) on 08/04/2015 1:04:44 PM      MDM   Final diagnoses:  Pneumothorax   81 year old male with a history of COPD, prescription for carcinoma of the lung on chemotherapy and radiation, thoracic setting aortic aneurysm, coronary artery disease, radiation pneumonitis, atrial fibrillation on xarelto presents with worsening shortness of breath over the last 3 days.  EKG shows atrial fibrillation with a rate of 112  without acute changes, similar to prior. Chest x-ray was done which showed a right pneumothorax estimated 25%.  Cardiothoracic surgery, Dr. Roxy Manns, was consulted and came to bedside to evaluate the patient. He ordered an chest CT which was completed and showed an enlargement of his pneumothorax. Dr. Zenia Resides will come to bedside to place chest tube. Plan after his initial evaluation for was for admission to pulmonology for further care.  Patient with chronic atrial fibrillation, mild tachycardia, otherwise hemodynamically stable.  Normal O2 saturation on room air, however placed on O2 for pneumothorax.      Gareth Morgan, MD 08/04/15 1730

## 2015-08-04 NOTE — H&P (Signed)
Name: [name] MRN: [id number] DOB: 09-18-1933    ADMISSION DATE:  08/04/2015  REFERRING MD :  [facility] ED  CHIEF COMPLAINT:  Progressive dyspnea  BRIEF PATIENT DESCRIPTION: 81 yo man, hx severe COPD, squamous cell lung CA s/p chemoradiation, hypoxemia and chronic pred use. Seen in office 10/25 with slowly progressive dyspnea. Worsened over the next 3 days prompting ED eval 10/29. CXR showed new 25% R PTX. He is on Eliquis for A fib.   SIGNIFICANT EVENTS  Spiriva added to Symbicort 10/25 Prednisone decrease from '10mg'$  bid to '10mg'$  qd 10/25  STUDIES:  CT chest 10/29 >> moderate / large R PTX without shift, significant emphysema, stable R perihilar mass, B pulmonary nodules   HISTORY OF PRESENT ILLNESS:  81 year old former smoker (130 pack years) with a history of stage IIIa squamous cell lung cancer status post concurrent chemoradiation, last dose 02/2015. He has been on chronic prednisone for many years. PFT from 10/25 show very severe obstruction, FEV1 0.99L (36% predicted). He has been experiencing progressive dyspnea for several months, more severe over the last 4 weeks.  On 10/25 he was started on spiriva, decreased his chronic pred to '10mg'$  daily. He has worsened in the interim. CXR on ED eval 10/29 showed 25% R pneumothorax. He is on xarelto for Atrial fibrillation .   PAST MEDICAL HISTORY :   has a past medical history of Syncope; COPD (chronic obstructive pulmonary disease) ([place]); Chronic atrial fibrillation ([facility]); GERD (gastroesophageal reflux disease); Coronary artery disease; Kidney stone; CAD (coronary artery disease), native coronary artery (12/03/2014); Thoracic ascending aortic aneurysm ([place]) (12/03/2014); Radiation (01/04/15-02/16/15); Chronic respiratory failure ([place]) (06/01/2015); COPD (11/25/2014); Current use of long term anticoagulation; History of tobacco abuse (11/25/2014); Hyperthyroidism (06/01/2015); SCC (squamous cell carcinoma of lung) ([place]) (11/29/2014); Radiation  pneumonitis ([place]); and Pneumothorax, right (08/04/2015).  has past surgical history that includes Appendectomy; Cholecystectomy; Esophageal dilation (2012); Video bronchoscopy (Bilateral, 11/29/2014); and Esophagogastroduodenoscopy (N/A, 11/30/2014). Prior to Admission medications   Medication Sig Start Date End Date Taking? Authorizing Provider  albuterol (PROAIR HFA) 108 (90 BASE) MCG/ACT inhaler Inhale 2 puffs into the lungs every 6 (six) hours as needed for wheezing or shortness of breath. 05/18/15  Yes Domenic Polite, MD  budesonide-formoterol [facility]) 160-4.5 MCG/ACT inhaler Inhale 2 puffs into the lungs 2 (two) times daily.   Yes Historical Provider, MD  dextromethorphan-guaiFENesin (MUCINEX DM) 30-600 MG per 12 hr tablet Take 1 tablet by mouth daily.   Yes Historical Provider, MD  diltiazem (CARDIZEM) 90 MG tablet 2 every am and 1 at bedtime   Yes Historical Provider, MD  furosemide (LASIX) 40 MG tablet Take 40 mg by mouth daily.   Yes Historical Provider, MD  potassium chloride SA (KLOR-CON M20) 20 MEQ tablet Take 20 mEq by mouth daily.    Yes Historical Provider, MD  predniSONE (DELTASONE) 10 MG tablet 1 tablet daily 07/31/15  Yes Collene Gobble, MD  rivaroxaban (XARELTO) 20 MG TABS tablet Take 20 mg by mouth daily with supper.   Yes Historical Provider, MD  tiotropium (SPIRIVA) 18 MCG inhalation capsule Place 1 capsule (18 mcg total) into inhaler and inhale daily. 07/31/15  Yes Collene Gobble, MD  triamcinolone cream (KENALOG) 0.1 % apply as needed 02/26/15  Yes Historical Provider, MD  zolpidem (AMBIEN) 10 MG tablet Take 10 mg by mouth at bedtime.    Yes Historical Provider, MD   Allergies  Allergen Reactions  . Codeine Itching    FAMILY HISTORY:  family history includes  Cancer in an other family member; Heart attack in his sister; Lung cancer in his daughter; Stroke in an other family member. SOCIAL HISTORY:  reports that he quit smoking about 5 years ago. His smoking use included  Cigarettes. He has a 130 pack-year smoking history. He has never used smokeless tobacco. He reports that he does not drink alcohol or use illicit drugs.  REVIEW OF SYSTEMS:   As per HPI  SUBJECTIVE:  Progressive SOB and R sided pain  VITAL SIGNS: Temp:  [99.2 F (37.3 C)] 99.2 F (37.3 C) (10/29 1255) Pulse Rate:  [96-109] 109 (10/29 1530) Resp:  [21-24] 21 (10/29 1530) BP: (109-117)/(62-80) 117/74 mmHg (10/29 1530) SpO2:  [96 %-100 %] 97 % (10/29 1530) Weight:  [83.207 kg (183 lb 7 oz)] 83.207 kg (183 lb 7 oz) (10/29 1255)  PHYSICAL EXAMINATION: General:  Elderly man, in mild resp distress Neuro:  A&O x 3, non focal HEENT:  Op clear, no lesions Cardiovascular:  Tachy, no M Lungs:  Diminished on R, L distant but clear Abdomen: benign Musculoskeletal:  No deformities Skin:  Multiple ecchymoses.    Recent Labs Lab 08/04/15 1309  NA 136  K 3.8  CL 98*  CO2 28  BUN 35*  CREATININE 1.41*  GLUCOSE 115*    Recent Labs Lab 08/04/15 1309  HGB 12.0*  HCT 39.5  WBC 14.3*  PLT 177   Dg Chest 2 View  08/04/2015  CLINICAL DATA:  Pt has lung cancer and is increasing SOB; pt is struggling to breathe and inhaler isnt helping. Pt had radiation from march to may this year and he states that ruined his breathing. Hx COPD EXAM: CHEST - 2 VIEW COMPARISON:  CT 06/25/2015 and previous FINDINGS: There is a new moderate right pneumothorax seen best laterally and posteriorly, estimated less than 25%. Coarse right infrahilar opacities are stable. Scarring or subsegmental atelectasis in the lung bases right greater than left as before. Attenuated bronchovascular markings in the left upper lobe. No definite effusion. Heart size remains normal. Atheromatous aorta. Bridging spurs across multiple levels in the mid thoracic spine as before. IMPRESSION: 1. Right lateral pneumothorax estimated less than 25%. Critical Value/emergent results were called by telephone at the time of interpretation on  08/04/2015 at 1:28 pm to Dr. Gareth Morgan , who verbally acknowledged these results. Electronically Signed   By: Lucrezia Europe M.D.   On: 08/04/2015 13:28   Ct Chest Wo Contrast  08/04/2015  ADDENDUM REPORT: 08/04/2015 17:09 ADDENDUM: Critical Value/emergent results were called by telephone at the time of interpretation on 08/04/2015 at 5:09 pm to Dr. Ellin Saba, who verbally acknowledged these results. Electronically Signed   By: Franki Cabot M.D.   On: 08/04/2015 17:09  08/04/2015  CLINICAL DATA:  Worsening shortness of breath for 1 week, denies chest pain. Pneumothorax seen on chest x-ray. Per previous CT report, patient has history of lung cancer status post radiation therapy. EXAM: CT CHEST WITHOUT CONTRAST TECHNIQUE: Multidetector CT imaging of the chest was performed following the standard protocol without IV contrast. COMPARISON:  Chest x-ray from earlier same day. Also comparison to chest CT dated 06/25/2015. FINDINGS: There is a moderate to large pneumothorax on the right, increased in size since the earlier chest x-ray. Extensive emphysematous changes noted bilaterally. Irregular mass within the right hilum measures 3.4 x 2.4 cm, similar to the size on most recent chest CT of 06/25/2015, significantly decreased in size compared to earlier chest CTs dating back to 11/26/2014. 8 mm nodular  density within the right upper lobe is stable compared to CTs dating back to 11/26/2014 (image 25, series 2). 6 mm nodule within the right lower lobe is stable (image 42). 7 mm pulmonary nodule within the left lower lobe is stable (image 42). 6 mm pulmonary nodule within the left lower lobe posteriorly is stable (image 40). No new pulmonary nodules or masses seen. Atherosclerotic changes again noted along the walls of the normal-caliber thoracic aorta. Heart size is normal. Diffuse coronary artery calcifications again noted. Scattered small lymph nodes noted within the mediastinum, unchanged. Heart size is normal.  No pericardial effusion. Degenerative changes noted throughout the thoracolumbar spine but no acute osseous abnormality. 7 mm stone nonobstructing left renal stone. Several right renal stones noted, largest measuring 5 mm. No associated hydronephrosis bilaterally. Patient is status postcholecystectomy. Limited images of the upper abdomen are otherwise unremarkable. IMPRESSION: 1. Right pneumothorax, moderate to large in size, increased in size compared to the chest x-ray from earlier same day. No significant mediastinal shift seen. 2. Extensive emphysematous changes bilaterally. 3. Small right pleural effusion. 4. Irregular mass within the right perihilar/suprahilar lung, compatible with patient's known history of lung cancer, stable compared to the most recent chest CT of 06/25/2015 but significantly decreased in size compared to the earlier CT of 11/26/2014. 5. Bilateral pulmonary nodules, suspicious for metastatic disease, but stable compared to multiple prior chest CTs dating back to 11/26/2014. 6. Diffuse coronary artery calcifications.  Heart size is normal. 7. Bilateral nephrolithiasis. Electronically Signed: By: Franki Cabot M.D. On: 08/04/2015 17:05    ASSESSMENT / PLAN:  1- GOLD D COPD 2- Hx Squamous cell lung cancer s/p chemoradiation and decrease in size R hilar mass 3- enlarging R pneumothorax 4- probable R radiation pneumonitis with residual scar.  5- atrial fibrillation  6- chronic prednisone use.   Plans:  - appreciate Dr Guy Sandifer help with targeted chest tube placement > planning to place tonight given interval increase in size of the PTX - hold Xarelto now - continue bronchodilators > symbicort and spiriva. If unable to take / tolerate the can change to nebulized regimen.  - continue prednisone '20mg'$  daily, his prior chronic dose.  - continue daily lasix - continue mucinex-D   Baltazar Apo, MD, PhD 08/04/2015, 5:33 PM [facility] [phone] or if no  answer [phone]

## 2015-08-04 NOTE — Telephone Encounter (Signed)
Increased DOE and cough for past week or more. Saw Dr Lamonte Sakai 10/28> added Spiriva, reduced prednisone from 20 to 10 mg. No pain or obvious acute infection per family. Plan- increase prednisone to 40 mg daily for 10/29, 10/ 30. Call office 10/31 to report status. If worse go to ER.

## 2015-08-04 NOTE — ED Notes (Signed)
Pt here for worsening SOB over the past week. Denies pain. sts recently started on new inhaler. sts worse with exertion.

## 2015-08-04 NOTE — ED Notes (Signed)
Chemical engineer at bedside.

## 2015-08-04 NOTE — Op Note (Signed)
CARDIOTHORACIC SURGERY OPERATIVE NOTE  Date of Procedure:  08/04/2015  Preoperative Diagnosis: Right Pneumothorax  Postoperative Diagnosis: Same  Procedure:   Right chest tube placement  Surgeon:   Valentina Gu. Roxy Manns, MD  Anesthesia: 1% lidocaine local with intravenous sedation    DETAILS OF THE OPERATIVE PROCEDURE  Following full informed consent the patient was given midazolam 2 mg intravenously and continuously monitored for rhythm, BP and oxygen saturation. The right chest was prepared and draped in a sterile manner. 1% lidocaine was utilized to anesthetize the skin and subcutaneous tissues. A small incision was made and a 28 French straight chest tube was placed through the incision into the pleural space. The tube was secured to the skin and connected to a closed suction collection device. The patient tolerated the procedure well. A portable CXR was ordered. There were no complications.    Valentina Gu. Roxy Manns, MD 08/04/2015  6:18 PM

## 2015-08-05 ENCOUNTER — Inpatient Hospital Stay (HOSPITAL_COMMUNITY): Payer: Medicare Other

## 2015-08-05 DIAGNOSIS — J700 Acute pulmonary manifestations due to radiation: Secondary | ICD-10-CM

## 2015-08-05 DIAGNOSIS — C3411 Malignant neoplasm of upper lobe, right bronchus or lung: Secondary | ICD-10-CM

## 2015-08-05 DIAGNOSIS — J189 Pneumonia, unspecified organism: Secondary | ICD-10-CM

## 2015-08-05 LAB — BASIC METABOLIC PANEL
Anion gap: 9 (ref 5–15)
BUN: 38 mg/dL — AB (ref 6–20)
CHLORIDE: 97 mmol/L — AB (ref 101–111)
CO2: 32 mmol/L (ref 22–32)
CREATININE: 1.23 mg/dL (ref 0.61–1.24)
Calcium: 9.2 mg/dL (ref 8.9–10.3)
GFR calc Af Amer: >60 mL/min (ref >60)
GFR calc non Af Amer: 54 mL/min — ABNORMAL LOW (ref >60)
GLUCOSE: 104 mg/dL — AB (ref 65–99)
Potassium: 3.8 mmol/L (ref 3.5–5.1)
Sodium: 138 mmol/L (ref 135–145)

## 2015-08-05 LAB — CBC
HCT: 38.9 % — ABNORMAL LOW (ref 39.0–52.0)
Hemoglobin: 11.7 g/dL — ABNORMAL LOW (ref 13.0–17.0)
MCH: 25.5 pg — AB (ref 26.0–34.0)
MCHC: 30.1 g/dL (ref 30.0–36.0)
MCV: 84.7 fL (ref 78.0–100.0)
PLATELETS: 161 10*3/uL (ref 150–400)
RBC: 4.59 MIL/uL (ref 4.22–5.81)
RDW: 18.4 % — AB (ref 11.5–15.5)
WBC: 10.4 10*3/uL (ref 4.0–10.5)

## 2015-08-05 LAB — MRSA PCR SCREENING: MRSA by PCR: NEGATIVE

## 2015-08-05 MED ORDER — POTASSIUM CHLORIDE CRYS ER 20 MEQ PO TBCR
20.0000 meq | EXTENDED_RELEASE_TABLET | Freq: Once | ORAL | Status: AC
Start: 2015-08-05 — End: 2015-08-05
  Administered 2015-08-05: 20 meq via ORAL
  Filled 2015-08-05: qty 1

## 2015-08-05 MED ORDER — TRAMADOL HCL 50 MG PO TABS
50.0000 mg | ORAL_TABLET | Freq: Four times a day (QID) | ORAL | Status: DC | PRN
  Administered 2015-08-05 – 2015-08-09 (×11): 50 mg via ORAL
  Filled 2015-08-05 (×11): qty 1

## 2015-08-05 MED ORDER — DILTIAZEM HCL 100 MG IV SOLR
5.0000 mg/h | INTRAVENOUS | Status: DC
  Administered 2015-08-05 – 2015-08-06 (×2): 5 mg/h via INTRAVENOUS
  Filled 2015-08-05 (×3): qty 100

## 2015-08-05 MED ORDER — AZITHROMYCIN 500 MG PO TABS
500.0000 mg | ORAL_TABLET | Freq: Every day | ORAL | Status: AC
  Administered 2015-08-05: 500 mg via ORAL
  Filled 2015-08-05: qty 1

## 2015-08-05 MED ORDER — DILTIAZEM LOAD VIA INFUSION
20.0000 mg | Freq: Once | INTRAVENOUS | Status: AC
  Administered 2015-08-05: 20 mg via INTRAVENOUS
  Filled 2015-08-05: qty 20

## 2015-08-05 MED ORDER — DIGOXIN 0.25 MG/ML IJ SOLN
0.2500 mg | Freq: Four times a day (QID) | INTRAMUSCULAR | Status: AC
  Administered 2015-08-05 (×2): 0.25 mg via INTRAVENOUS
  Filled 2015-08-05 (×2): qty 1

## 2015-08-05 MED ORDER — DEXTROSE 5 % IV SOLN
1.0000 g | INTRAVENOUS | Status: DC
  Administered 2015-08-05 – 2015-08-10 (×6): 1 g via INTRAVENOUS
  Filled 2015-08-05 (×6): qty 10

## 2015-08-05 MED ORDER — MORPHINE SULFATE (PF) 2 MG/ML IV SOLN
INTRAVENOUS | Status: AC
  Administered 2015-08-05: 2 mg via INTRAVENOUS
  Filled 2015-08-05: qty 1

## 2015-08-05 MED ORDER — AZITHROMYCIN 250 MG PO TABS
250.0000 mg | ORAL_TABLET | Freq: Every day | ORAL | Status: AC
  Administered 2015-08-06 – 2015-08-09 (×4): 250 mg via ORAL
  Filled 2015-08-05 (×4): qty 1

## 2015-08-05 NOTE — Progress Notes (Signed)
Name: [name] MRN: [id number] DOB: 1933/05/13    ADMISSION DATE:  08/04/2015  REFERRING MD :  [facility] ED  CHIEF COMPLAINT:  Progressive dyspnea  BRIEF PATIENT DESCRIPTION: 81 yo man, hx severe COPD, squamous cell lung CA s/p chemoradiation, hypoxemia and chronic pred use. Seen in office 10/25 with slowly progressive dyspnea. Worsened over the next 3 days prompting ED eval 10/29. CXR showed new 25% R PTX. He is on Eliquis for A fib.   SIGNIFICANT EVENTS  Spiriva added to Symbicort 10/25 Prednisone decrease from '10mg'$  bid to '10mg'$  qd 10/25  STUDIES:  CT chest 10/29 >> moderate / large R PTX without shift, significant emphysema, stable R perihilar mass, B pulmonary nodules   HISTORY OF PRESENT ILLNESS:  81 year old former smoker (130 pack years) with a history of stage IIIa squamous cell lung cancer status post concurrent chemoradiation, last dose 02/2015. He has been on chronic prednisone for many years. PFT from 10/25 show very severe obstruction, FEV1 0.99L (36% predicted). He has been experiencing progressive dyspnea for several months, more severe over the last 4 weeks.  On 10/25 he was started on spiriva, decreased his chronic pred to '10mg'$  daily. He has worsened in the interim. CXR on ED eval 10/29 showed 25% R pneumothorax. He is on xarelto for Atrial fibrillation .     SUBJECTIVE:  Coughing purulent mucus, no blood. Now denies pain. Discussed w nurse. Heart rate ranges up to 170  VITAL SIGNS: Temp:  [97.6 F (36.4 C)-99.2 F (37.3 C)] 98.2 F (36.8 C) (10/30 0700) Pulse Rate:  [91-115] 106 (10/30 0835) Resp:  [18-29] 29 (10/30 0835) BP: (109-126)/(62-94) 119/67 mmHg (10/30 0835) SpO2:  [94 %-100 %] 94 % (10/30 0904) Weight:  [83.207 kg (183 lb 7 oz)-84.5 kg (186 lb 4.6 oz)] 84.5 kg (186 lb 4.6 oz) (10/29 1930)  PHYSICAL EXAMINATION: General:  Elderly man, in mild resp distress, sitting edge of bed Neuro:  A&O x 3, non focal HEENT:  Op clear, no  lesions Cardiovascular:  Tachy- AFib, some sinus tach and occ PVC by monitor, no M Lungs:  Diminished on R, L distant but clear, R chest tube serosanguinous fluid fluctuates w resp. Abdomen: benign Musculoskeletal:  No deformities Skin:  Multiple ecchymoses.    Recent Labs Lab 08/04/15 1309 08/05/15 0450  NA 136 138  K 3.8 3.8  CL 98* 97*  CO2 28 32  BUN 35* 38*  CREATININE 1.41* 1.23  GLUCOSE 115* 104*    Recent Labs Lab 08/04/15 1309 08/05/15 0450  HGB 12.0* 11.7*  HCT 39.5 38.9*  WBC 14.3* 10.4  PLT 177 161   Dg Chest 2 View  08/04/2015  CLINICAL DATA:  Pt has lung cancer and is increasing SOB; pt is struggling to breathe and inhaler isnt helping. Pt had radiation from march to may this year and he states that ruined his breathing. Hx COPD EXAM: CHEST - 2 VIEW COMPARISON:  CT 06/25/2015 and previous FINDINGS: There is a new moderate right pneumothorax seen best laterally and posteriorly, estimated less than 25%. Coarse right infrahilar opacities are stable. Scarring or subsegmental atelectasis in the lung bases right greater than left as before. Attenuated bronchovascular markings in the left upper lobe. No definite effusion. Heart size remains normal. Atheromatous aorta. Bridging spurs across multiple levels in the mid thoracic spine as before. IMPRESSION: 1. Right lateral pneumothorax estimated less than 25%. Critical Value/emergent results were called by telephone at the time of interpretation on 08/04/2015 at 1:28  pm to Dr. Gareth Morgan , who verbally acknowledged these results. Electronically Signed   By: Lucrezia Europe M.D.   On: 08/04/2015 13:28   Ct Chest Wo Contrast  08/04/2015  ADDENDUM REPORT: 08/04/2015 17:09 ADDENDUM: Critical Value/emergent results were called by telephone at the time of interpretation on 08/04/2015 at 5:09 pm to Dr. Ellin Saba, who verbally acknowledged these results. Electronically Signed   By: Franki Cabot M.D.   On: 08/04/2015  17:09  08/04/2015  CLINICAL DATA:  Worsening shortness of breath for 1 week, denies chest pain. Pneumothorax seen on chest x-ray. Per previous CT report, patient has history of lung cancer status post radiation therapy. EXAM: CT CHEST WITHOUT CONTRAST TECHNIQUE: Multidetector CT imaging of the chest was performed following the standard protocol without IV contrast. COMPARISON:  Chest x-ray from earlier same day. Also comparison to chest CT dated 06/25/2015. FINDINGS: There is a moderate to large pneumothorax on the right, increased in size since the earlier chest x-ray. Extensive emphysematous changes noted bilaterally. Irregular mass within the right hilum measures 3.4 x 2.4 cm, similar to the size on most recent chest CT of 06/25/2015, significantly decreased in size compared to earlier chest CTs dating back to 11/26/2014. 8 mm nodular density within the right upper lobe is stable compared to CTs dating back to 11/26/2014 (image 25, series 2). 6 mm nodule within the right lower lobe is stable (image 42). 7 mm pulmonary nodule within the left lower lobe is stable (image 42). 6 mm pulmonary nodule within the left lower lobe posteriorly is stable (image 40). No new pulmonary nodules or masses seen. Atherosclerotic changes again noted along the walls of the normal-caliber thoracic aorta. Heart size is normal. Diffuse coronary artery calcifications again noted. Scattered small lymph nodes noted within the mediastinum, unchanged. Heart size is normal. No pericardial effusion. Degenerative changes noted throughout the thoracolumbar spine but no acute osseous abnormality. 7 mm stone nonobstructing left renal stone. Several right renal stones noted, largest measuring 5 mm. No associated hydronephrosis bilaterally. Patient is status postcholecystectomy. Limited images of the upper abdomen are otherwise unremarkable. IMPRESSION: 1. Right pneumothorax, moderate to large in size, increased in size compared to the chest x-ray  from earlier same day. No significant mediastinal shift seen. 2. Extensive emphysematous changes bilaterally. 3. Small right pleural effusion. 4. Irregular mass within the right perihilar/suprahilar lung, compatible with patient's known history of lung cancer, stable compared to the most recent chest CT of 06/25/2015 but significantly decreased in size compared to the earlier CT of 11/26/2014. 5. Bilateral pulmonary nodules, suspicious for metastatic disease, but stable compared to multiple prior chest CTs dating back to 11/26/2014. 6. Diffuse coronary artery calcifications.  Heart size is normal. 7. Bilateral nephrolithiasis. Electronically Signed: By: Franki Cabot M.D. On: 08/04/2015 17:05   Portable Chest 1 View  08/05/2015  CLINICAL DATA:  Pneumothorax EXAM: PORTABLE CHEST 1 VIEW COMPARISON:  Radiograph 08/04/2015 CT, 08/04/2015 FINDINGS: Normal cardiac silhouette. RIGHT chest tube in place. There is RIGHT perihilar consolidation not changed. Small RIGHT apical pneumothorax measures 10 mm from the chest wall compared to 10 mm on prior. LEFT lung is clear. IMPRESSION: 1. Small RIGHT apical pneumothorax with chest tube in place. No change. 2. RIGHT perihilar consolidation again demonstrated. Electronically Signed   By: Suzy Bouchard M.D.   On: 08/05/2015 08:21   Dg Chest Port 1 View  08/04/2015  CLINICAL DATA:  81 year old male with a history of chest tube placement. History of lung carcinoma, COPD, atrial  fibrillation. EXAM: PORTABLE CHEST 1 VIEW COMPARISON:  CT 08/04/2015 FINDINGS: Interval placement of right-sided thoracostomy tube, terminating near the hilum. Resolution of right-sided pneumothorax. Mixed interstitial and airspace disease on the right is similar to prior. Chronic interstitial opacities of the left are unchanged. No left-sided pneumothorax. Cardiomediastinal silhouette unchanged. IMPRESSION: Resolution of right-sided pneumothorax status post large bore thoracostomy tube placement.  Similar appearance of mixed interstitial and airspace disease. Signed, Dulcy Fanny. Earleen Newport, DO [facility]iology Electronically Signed   By: Corrie Mckusick D.O.   On: 08/04/2015 18:41   CT and CXR images reviewed by me 10/30  ASSESSMENT / PLAN:  1- GOLD D COPD 2- Hx Squamous cell lung cancer s/p chemoradiation and decrease in size R hilar mass 3-  R pneumothorax> chest tube per TSGY 4- probable R radiation pneumonitis with residual scar.  5- atrial fibrillation with uncontrolled VR 6- chronic prednisone use.  7- Grossly purulent sputum c/w bacterial bronchitis/ pneumonia. Address this as a community bronchopneumonia, absent fever, pending culture.  Plans:  - appreciate Dr Guy Sandifer help with targeted chest tube placement  - hold Xarelto now - continue bronchodilators > symbicort and spiriva. If unable to take / tolerate the can change to nebulized regimen.  - continue prednisone '20mg'$  daily, his prior chronic dose.  - continue daily lasix - continue mucinex-D -Adding tramadol for mild pain and cough - Consulted Cardiology Dr Wynonia Lawman for heart rate - Sputum Cx then start Rocephin/zith CD Young, MD  08/05/2015, 9:24 AM [facility] [phone]   If after 3:00 PM [phone]

## 2015-08-05 NOTE — Progress Notes (Addendum)
      [address]       [place] [address]             [phone]            Subjective: Patient sitting at edge of bed.  Objective: Vital signs in last 24 hours: Temp:  [97.6 F (36.4 C)-99.2 F (37.3 C)] 98.2 F (36.8 C) (10/30 0700) Pulse Rate:  [91-115] 106 (10/30 0835) Cardiac Rhythm:  [-] Atrial fibrillation (10/30 0745) Resp:  [18-29] 29 (10/30 0835) BP: (109-126)/(62-94) 119/67 mmHg (10/30 0835) SpO2:  [94 %-100 %] 94 % (10/30 0904) Weight:  [183 lb 7 oz (83.207 kg)-186 lb 4.6 oz (84.5 kg)] 186 lb 4.6 oz (84.5 kg) (10/29 1930)     Intake/Output from previous day: 10/29 0701 - 10/30 0700 In: 360 [P.O.:360] Out: 415 [Urine:200; Chest Tube:215]   Physical Exam:  Cardiovascular: Slightly tachy Pulmonary: Coarse breath sounds on the right and mostly clear on left Chest Tube: to suction, tidling but no true air leak appreciated  Lab Results: CBC: Recent Labs  08/04/15 1309 08/05/15 0450  WBC 14.3* 10.4  HGB 12.0* 11.7*  HCT 39.5 38.9*  PLT 177 161   BMET:  Recent Labs  08/04/15 1309 08/05/15 0450  NA 136 138  K 3.8 3.8  CL 98* 97*  CO2 28 32  GLUCOSE 115* 104*  BUN 35* 38*  CREATININE 1.41* 1.23  CALCIUM 9.1 9.2    PT/INR: No results for input(s): LABPROT, INR in the last 72 hours. ABG:  INR: Will add last result for INR, ABG once components are confirmed Will add last 4 CBG results once components are confirmed  Assessment/Plan:  1. CV - ST in the low 100's. 2.  Pulmonary - History of COPD, stage IIIa SCC lung. Chest tube with 215 cc since placement. Chest tube is to suction and there is tidling with cough but no air leak.On 2-3 liters of oxygen via [place]. CXR this am shows small right apical pneumothorax and right perihilar consolidation. Hope to place chest tube to water seal soon. Check CXR in am. 3. Supplement potassium  ZIMMERMAN,DONIELLE MPA-C 08/05/2015,9:34 AM  I have seen and examined the patient and agree  with the assessment and plan as outlined.  Keep tube on suction today.  I spent in excess of 15 minutes during the conduct of this hospital encounter and >50% of this time involved direct face-to-face encounter with the patient for counseling and/or coordination of their care.   Rexene Alberts, MD 08/05/2015 1:37 PM

## 2015-08-05 NOTE — Consult Note (Signed)
Cardiology Consult Note  Admit date: 08/04/2015 Name: [name] 81 y.o.  male DOB:  Aug 26, 1933 MRN:  [id number]  Today's date:  08/05/2015  Referring Physician:    Dr. Baird Lyons  Reason for Consultation:    Rapid atrial fibrillation   IMPRESSIONS: 1.  Rapid Atrial fibrillation likely due to stress of recent chest tube insertion and radiation 2.  Ascending aortic aneurysm-stable 3.  Severe COPD 4.  Hypertension  RECOMMENDATION: 1.  Initiate treatment with intravenous diltiazem and may add digoxin to help with rate control.  2.  Continue treatment for pneumothorax and lung cancer 3.  Continue anticoagulation   HISTORY: This 81 year old male has a history of chronic atrial fibrillation and he also has underlying COPD.  He has a history of a thoracic aneurysm that has been followed on CT scan as well as some coronary calcification previously with a negative stress test.  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  He developed squamous cell lung cancer treated with radiation and is quite limited with dyspnea.  He has been anticoagulated for his atrial fibrillation in the past.  He was admitted with severe shortness of breath and found to have a new pneumothorax.  A chest tube was placed and his XARELTO was held.  He is not currently complaining of shortness of breath.  His atrial fibrillation rate has been rapid since the chest tube was inserted and on admission.  Denies any chest pain suggestive of angina, has moderate shortness of breath but has severe COPD.  Past Medical History  Diagnosis Date  . Syncope   . COPD (chronic obstructive pulmonary disease) ([place])   . Chronic atrial fibrillation ([place])   . GERD (gastroesophageal reflux disease)   . Coronary artery disease   . Kidney stone   . CAD (coronary artery disease), native coronary artery 12/03/2014  . Thoracic ascending aortic aneurysm ([place]) 12/03/2014    4.2 x 4.3 cm noted on CT scan February 2016   .  Radiation 01/04/15-02/16/15    right upper chest/hilar region 60 Gy  . Chronic respiratory failure ([place]) 06/01/2015    - 05/31/2015   Walked 3lpm x one lap @ 185 stopped due to sob with sats still 94% slow pace    rx as of 05/31/2015 = 3lpm 24/7   . COPD 11/25/2014    - Spirometry 09/12/2005  FEV11.05 with ratio 27  - 06/15/2015 p extensive coaching HFA effectiveness =    90%      . Current use of long term anticoagulation   . History of tobacco abuse 11/25/2014  . Hyperthyroidism 06/01/2015  . SCC (squamous cell carcinoma of lung) ([place]) 11/29/2014    Stage IIIA (T3N1M0) - treated with concurrent chemoradiation with weekly carboplatin for AUC of 2 and paclitaxel 45 MG/M2. Last dose 02/12/2015 with partial response  . Radiation pneumonitis ([place])   . Pneumothorax, right 08/04/2015     Past Surgical History  Procedure Laterality Date  . Appendectomy    . Cholecystectomy    . Esophageal dilation  2012  . Video bronchoscopy Bilateral 11/29/2014    Procedure: VIDEO BRONCHOSCOPY WITHOUT FLUORO;  Surgeon: Collene Gobble, MD;  Location: [place];  Service: Cardiopulmonary;  Laterality: Bilateral;  . Esophagogastroduodenoscopy N/A 11/30/2014    Procedure: ESOPHAGOGASTRODUODENOSCOPY (EGD);  Surgeon: Arta Silence, MD;  Location: [facility] ENDOSCOPY;  Service: Endoscopy;  Laterality: N/A;    Allergies:  is allergic to codeine.   Medications: Prior to Admission medications  Medication Sig Start Date End Date Taking? Authorizing Provider  albuterol (PROAIR HFA) 108 (90 BASE) MCG/ACT inhaler Inhale 2 puffs into the lungs every 6 (six) hours as needed for wheezing or shortness of breath. 05/18/15  Yes Domenic Polite, MD  budesonide-formoterol [facility]) 160-4.5 MCG/ACT inhaler Inhale 2 puffs into the lungs 2 (two) times daily.   Yes Historical Provider, MD  dextromethorphan-guaiFENesin (MUCINEX DM) 30-600 MG per 12 hr tablet Take 1 tablet by mouth daily.   Yes Historical Provider, MD  diltiazem (CARDIZEM) 90 MG  tablet 2 every am and 1 at bedtime   Yes Historical Provider, MD  furosemide (LASIX) 40 MG tablet Take 40 mg by mouth daily.   Yes Historical Provider, MD  potassium chloride SA (KLOR-CON M20) 20 MEQ tablet Take 20 mEq by mouth daily.    Yes Historical Provider, MD  predniSONE (DELTASONE) 10 MG tablet 1 tablet daily 07/31/15  Yes Collene Gobble, MD  rivaroxaban (XARELTO) 20 MG TABS tablet Take 20 mg by mouth daily with supper.   Yes Historical Provider, MD  tiotropium (SPIRIVA) 18 MCG inhalation capsule Place 1 capsule (18 mcg total) into inhaler and inhale daily. 07/31/15  Yes Collene Gobble, MD  triamcinolone cream (KENALOG) 0.1 % apply as needed 02/26/15  Yes Historical Provider, MD  zolpidem (AMBIEN) 10 MG tablet Take 10 mg by mouth at bedtime.    Yes Historical Provider, MD   Family History: Father died of a stroke, mother died of cancer, sister has had coronary artery disease  Social History:   reports that he quit smoking about 5 years ago. His smoking use included Cigarettes. He has a 130 pack-year smoking history. He has never used smokeless tobacco. He reports that he does not drink alcohol or use illicit drugs.   Of note his daughter died a couple of weeks ago of small cell lung cancer.   Review of Systems: Other than as noted above the remainder of the review of systems is unremarkable.  Physical Exam: BP 119/67 mmHg  Pulse 106  Temp(Src) 98.2 F (36.8 C) (Oral)  Resp 29  Ht '5\' 9"'$  (1.753 m)  Wt 84.5 kg (186 lb 4.6 oz)  BMI 27.50 kg/m2  SpO2 94% General appearance: Pleasant male sitting at bedside, mildly obese with chest tube in place, in no acute distress Head: Normocephalic, without obvious abnormality, atraumatic Neck: no adenopathy, no carotid bruit, no JVD and supple, symmetrical, trachea midline Lungs: Reduced breath sounds at the right base, some ecchymoses seen posteriorly on the back Heart: Rapid irregular rhythm, normal S1-S2 no S3 Abdomen: soft, non-tender;  bowel sounds normal; no masses,  no organomegaly Rectal: deferred Extremities: Trace edema noted Pulses: 2+ and symmetric Neurologic: Grossly normal  Labs: CBC  Recent Labs  08/05/15 0450  WBC 10.4  RBC 4.59  HGB 11.7*  HCT 38.9*  PLT 161  MCV 84.7  MCH 25.5*  MCHC 30.1  RDW 18.4*   CMP   Recent Labs  08/05/15 0450  NA 138  K 3.8  CL 97*  CO2 32  GLUCOSE 104*  BUN 38*  CREATININE 1.23  CALCIUM 9.2  GFRNONAA 54*  GFRAA >60   BNP (last 3 results)    Component Value Date/Time   BNP 268.1* 05/16/2015 0458   Radiology: Small residual pneumothorax with right hilar consolidation, chest tube in place  EKG: Atrial fibrillation with rapid ventricular response  Signed:  Kerry Hough MD [facility]   Cardiology Consultant  08/05/2015, 12:09 PM

## 2015-08-06 ENCOUNTER — Inpatient Hospital Stay (HOSPITAL_COMMUNITY): Payer: Medicare Other

## 2015-08-06 LAB — BASIC METABOLIC PANEL
Anion gap: 7 (ref 5–15)
BUN: 44 mg/dL — ABNORMAL HIGH (ref 6–20)
CALCIUM: 9 mg/dL (ref 8.9–10.3)
CO2: 29 mmol/L (ref 22–32)
CREATININE: 1.27 mg/dL — AB (ref 0.61–1.24)
Chloride: 101 mmol/L (ref 101–111)
GFR calc non Af Amer: 52 mL/min — ABNORMAL LOW (ref >60)
GFR, EST AFRICAN AMERICAN: 60 mL/min — AB (ref >60)
Glucose, Bld: 110 mg/dL — ABNORMAL HIGH (ref 65–99)
Potassium: 4.7 mmol/L (ref 3.5–5.1)
SODIUM: 137 mmol/L (ref 135–145)

## 2015-08-06 LAB — CBC WITH DIFFERENTIAL/PLATELET
Basophils Absolute: 0 10*3/uL (ref 0.0–0.1)
Basophils Relative: 0 %
EOS ABS: 0 10*3/uL (ref 0.0–0.7)
Eosinophils Relative: 0 %
HCT: 34.5 % — ABNORMAL LOW (ref 39.0–52.0)
Hemoglobin: 10.5 g/dL — ABNORMAL LOW (ref 13.0–17.0)
Lymphocytes Relative: 3 %
Lymphs Abs: 0.4 10*3/uL — ABNORMAL LOW (ref 0.7–4.0)
MCH: 25.7 pg — ABNORMAL LOW (ref 26.0–34.0)
MCHC: 30.4 g/dL (ref 30.0–36.0)
MCV: 84.4 fL (ref 78.0–100.0)
MONO ABS: 0.5 10*3/uL (ref 0.1–1.0)
Monocytes Relative: 4 %
NEUTROS PCT: 93 %
Neutro Abs: 12 10*3/uL — ABNORMAL HIGH (ref 1.7–7.7)
PLATELETS: 133 10*3/uL — AB (ref 150–400)
RBC: 4.09 MIL/uL — AB (ref 4.22–5.81)
RDW: 18.2 % — AB (ref 11.5–15.5)
WBC: 12.9 10*3/uL — AB (ref 4.0–10.5)

## 2015-08-06 LAB — MAGNESIUM: Magnesium: 2.2 mg/dL (ref 1.7–2.4)

## 2015-08-06 MED ORDER — ALUM & MAG HYDROXIDE-SIMETH 200-200-20 MG/5ML PO SUSP
15.0000 mL | Freq: Four times a day (QID) | ORAL | Status: DC | PRN
  Administered 2015-08-06 – 2015-08-07 (×2): 15 mL via ORAL
  Filled 2015-08-06 (×3): qty 30

## 2015-08-06 MED ORDER — SODIUM CHLORIDE 0.9 % IV BOLUS (SEPSIS)
500.0000 mL | Freq: Once | INTRAVENOUS | Status: AC
  Administered 2015-08-06: 500 mL via INTRAVENOUS

## 2015-08-06 MED ORDER — DIGOXIN 0.25 MG/ML IJ SOLN
0.2500 mg | Freq: Four times a day (QID) | INTRAMUSCULAR | Status: AC
  Administered 2015-08-06 (×2): 0.25 mg via INTRAVENOUS
  Filled 2015-08-06 (×3): qty 1

## 2015-08-06 MED ORDER — KETOROLAC TROMETHAMINE 15 MG/ML IJ SOLN
15.0000 mg | Freq: Four times a day (QID) | INTRAMUSCULAR | Status: DC | PRN
  Administered 2015-08-07 – 2015-08-08 (×4): 15 mg via INTRAVENOUS
  Filled 2015-08-06 (×4): qty 1

## 2015-08-06 NOTE — Progress Notes (Signed)
eLink Physician-Brief Progress Note Patient Name: ARIEN BENINCASA DOB: December 12, 1932 MRN: [id number]   Date of Service  08/06/2015  HPI/Events of Note  Multiple issues: 1. Pain - already on Ultram and Toradol PRN. He is now due for Ultram. 2. AFIB with RVR. Ventricular rate = 138.  eICU Interventions  Will order: 1. Give Ultram dose that is due now.  2. Restart Cardizem IV infusion without bolus.  3. BMP and Mg++ level now.      Intervention Category Major Interventions: Arrhythmia - evaluation and management Intermediate Interventions: Pain - evaluation and management  Mellie Buccellato [place] 08/06/2015, 2:07 AM

## 2015-08-06 NOTE — Progress Notes (Addendum)
      [address]       [place] [address]             [phone]            Subjective: Patient resting in bed. He states he is breathing is "ok"  Objective: Vital signs in last 24 hours: Temp:  [97.4 F (36.3 C)-99.1 F (37.3 C)] 97.4 F (36.3 C) (10/31 1100) Pulse Rate:  [34-173] 75 (10/31 1230) Cardiac Rhythm:  [-] Atrial fibrillation (10/31 0745) Resp:  [16-30] 25 (10/31 1230) BP: (87-128)/(59-107) 100/68 mmHg (10/31 1230) SpO2:  [94 %-100 %] 98 % (10/31 1230)     Intake/Output from previous day: 10/30 0701 - 10/31 0700 In: 593.9 [I.V.:43.9; IV Piggyback:50] Out: 1400 [Urine:800; Chest Tube:600]   Physical Exam:  Cardiovascular: IRRR IRRR Pulmonary: Coarse breath sounds bilaterally Chest Tube: to water seal tidling but no true air leak appreciated. There are no side holes visible and chest tube is sutured tightly.  Lab Results: CBC:  Recent Labs  08/05/15 0450 08/06/15 0400  WBC 10.4 12.9*  HGB 11.7* 10.5*  HCT 38.9* 34.5*  PLT 161 133*   BMET:   Recent Labs  08/05/15 0450 08/06/15 0400  NA 138 137  K 3.8 4.7  CL 97* 101  CO2 32 29  GLUCOSE 104* 110*  BUN 38* 44*  CREATININE 1.23 1.27*  CALCIUM 9.2 9.0    PT/INR: No results for input(s): LABPROT, INR in the last 72 hours. ABG:  INR: Will add last result for INR, ABG once components are confirmed Will add last 4 CBG results once components are confirmed  Assessment/Plan:  1. CV - A fib with HR up to the 110's. On Cardizem drip and Digoxin IV. Per cardiology. 2.  Pulmonary - History of COPD (GOLD D), stage IIIa SCC lung. Chest tube with 600cc last 24 hours. Has had 125 cc since 7 am. Chest tube is to water seal and there is tidling with cough but no air leak.On 2 liters of oxygen via [place]. CXR this am shows small right apical pneumothorax and right perihilar consolidation. Hope to place chest tube to water seal soon. Check CXR in am. 3.ID-On Azithromycin and  Ceftriaxone  ZIMMERMAN,DONIELLE MPA-C 08/06/2015,12:50 PM   I have seen and examined the patient and agree with the assessment and plan as outlined.  Rexene Alberts, MD

## 2015-08-06 NOTE — Progress Notes (Signed)
Name: DEMETRIC DUNNAWAY MRN: [id number] DOB: 06-08-1933    ADMISSION DATE:  08/04/2015  REFERRING MD :  [facility] ED  CHIEF COMPLAINT:  Progressive dyspnea  BRIEF PATIENT DESCRIPTION:    81 year old former smoker (130 pack years) with a history of stage IIIa squamous cell lung cancer status post concurrent chemoradiation, last dose 02/2015. He has been on chronic prednisone for many years. PFT from 10/25 show very severe obstruction, FEV1 0.99L (36% predicted). He has been experiencing progressive dyspnea for several months, more severe over the last 4 weeks.  On 10/25 he was started on spiriva, decreased his chronic pred to '10mg'$  daily. He has worsened in the interim. CXR on ED eval 10/29 showed 25% R pneumothorax. He is on xarelto for Atrial fibrillation .    SIGNIFICANT EVENTS  Spiriva added to Symbicort 10/25 Prednisone decrease from '10mg'$  bid to '10mg'$  qd 10/25  CT chest 10/29 >> moderate / large R PTX without shift, significant emphysema, stable R perihilar mass, B pulmonary nodules  08/05/15 Spiriva added to Symbicort 10/25 Prednisone decrease from '10mg'$  bid to '10mg'$  qd 10/25  Coughing purulent mucus, no blood. Now denies pain. Discussed w nurse. Heart rate ranges up to 170  SUBJECTIVE/OVERNIGHT/INTERVAL HX 08/06/15 - feeling better. Thre is concern on CXR that chest tube port might be out. Grandughter repors he is doing better. TAchycardia better. Dr Wynonia Lawman rounded from cards. Anticoag on hold given chest tube 2d ago  VITAL SIGNS: Temp:  [97.4 F (36.3 C)-99.1 F (37.3 C)] 97.4 F (36.3 C) (10/31 0730) Pulse Rate:  [34-173] 69 (10/31 1000) Resp:  [16-30] 23 (10/31 1030) BP: (87-128)/(59-107) 111/64 mmHg (10/31 1030) SpO2:  [94 %-100 %] 94 % (10/31 1000)  PHYSICAL EXAMINATION: General:  Elderly man, in mild resp distress. SITS ON SIDE OF BEF Neuro:  A&O x 3, non focal HEENT:  Op clear, no lesions Cardiovascular:  Tachy, no M Lungs:   Rt crackles. Purse lip breathing on o2.  No  subcut air. CHEST TUBE TIDALNG +. NO BUBBLES Abdomen: benign Musculoskeletal:  No deformities Skin:  Multiple ecchymoses.   PULMONARY No results for input(s): PHART, PCO2ART, PO2ART, HCO3, TCO2, O2SAT in the last 168 hours.  Invalid input(s): PCO2, PO2  CBC  Recent Labs Lab 08/04/15 1309 08/05/15 0450 08/06/15 0400  HGB 12.0* 11.7* 10.5*  HCT 39.5 38.9* 34.5*  WBC 14.3* 10.4 12.9*  PLT 177 161 133*    COAGULATION No results for input(s): INR in the last 168 hours.  CARDIAC  No results for input(s): TROPONINI in the last 168 hours. No results for input(s): PROBNP in the last 168 hours.   CHEMISTRY  Recent Labs Lab 08/04/15 1309 08/05/15 0450 08/06/15 0400  NA 136 138 137  K 3.8 3.8 4.7  CL 98* 97* 101  CO2 28 32 29  GLUCOSE 115* 104* 110*  BUN 35* 38* 44*  CREATININE 1.41* 1.23 1.27*  CALCIUM 9.1 9.2 9.0  MG  --   --  2.2   Estimated Creatinine Clearance: 46.4 mL/min (by C-G formula based on Cr of 1.27).   LIVER No results for input(s): AST, ALT, ALKPHOS, BILITOT, PROT, ALBUMIN, INR in the last 168 hours.   INFECTIOUS No results for input(s): LATICACIDVEN, PROCALCITON in the last 168 hours.   ENDOCRINE CBG (last 3)  No results for input(s): GLUCAP in the last 72 hours.       IMAGING x48h  - image(s) personally visualized  -   highlighted in bold Dg Chest  2 View  08/04/2015  CLINICAL DATA:  Pt has lung cancer and is increasing SOB; pt is struggling to breathe and inhaler isnt helping. Pt had radiation from march to may this year and he states that ruined his breathing. Hx COPD EXAM: CHEST - 2 VIEW COMPARISON:  CT 06/25/2015 and previous FINDINGS: There is a new moderate right pneumothorax seen best laterally and posteriorly, estimated less than 25%. Coarse right infrahilar opacities are stable. Scarring or subsegmental atelectasis in the lung bases right greater than left as before. Attenuated bronchovascular markings in the left upper lobe. No  definite effusion. Heart size remains normal. Atheromatous aorta. Bridging spurs across multiple levels in the mid thoracic spine as before. IMPRESSION: 1. Right lateral pneumothorax estimated less than 25%. Critical Value/emergent results were called by telephone at the time of interpretation on 08/04/2015 at 1:28 pm to Dr. Gareth Morgan , who verbally acknowledged these results. Electronically Signed   By: Lucrezia Europe M.D.   On: 08/04/2015 13:28   Ct Chest Wo Contrast  08/04/2015  ADDENDUM REPORT: 08/04/2015 17:09 ADDENDUM: Critical Value/emergent results were called by telephone at the time of interpretation on 08/04/2015 at 5:09 pm to Dr. Ellin Saba, who verbally acknowledged these results. Electronically Signed   By: Franki Cabot M.D.   On: 08/04/2015 17:09  08/04/2015  CLINICAL DATA:  Worsening shortness of breath for 1 week, denies chest pain. Pneumothorax seen on chest x-ray. Per previous CT report, patient has history of lung cancer status post radiation therapy. EXAM: CT CHEST WITHOUT CONTRAST TECHNIQUE: Multidetector CT imaging of the chest was performed following the standard protocol without IV contrast. COMPARISON:  Chest x-ray from earlier same day. Also comparison to chest CT dated 06/25/2015. FINDINGS: There is a moderate to large pneumothorax on the right, increased in size since the earlier chest x-ray. Extensive emphysematous changes noted bilaterally. Irregular mass within the right hilum measures 3.4 x 2.4 cm, similar to the size on most recent chest CT of 06/25/2015, significantly decreased in size compared to earlier chest CTs dating back to 11/26/2014. 8 mm nodular density within the right upper lobe is stable compared to CTs dating back to 11/26/2014 (image 25, series 2). 6 mm nodule within the right lower lobe is stable (image 42). 7 mm pulmonary nodule within the left lower lobe is stable (image 42). 6 mm pulmonary nodule within the left lower lobe posteriorly is stable (image  40). No new pulmonary nodules or masses seen. Atherosclerotic changes again noted along the walls of the normal-caliber thoracic aorta. Heart size is normal. Diffuse coronary artery calcifications again noted. Scattered small lymph nodes noted within the mediastinum, unchanged. Heart size is normal. No pericardial effusion. Degenerative changes noted throughout the thoracolumbar spine but no acute osseous abnormality. 7 mm stone nonobstructing left renal stone. Several right renal stones noted, largest measuring 5 mm. No associated hydronephrosis bilaterally. Patient is status postcholecystectomy. Limited images of the upper abdomen are otherwise unremarkable. IMPRESSION: 1. Right pneumothorax, moderate to large in size, increased in size compared to the chest x-ray from earlier same day. No significant mediastinal shift seen. 2. Extensive emphysematous changes bilaterally. 3. Small right pleural effusion. 4. Irregular mass within the right perihilar/suprahilar lung, compatible with patient's known history of lung cancer, stable compared to the most recent chest CT of 06/25/2015 but significantly decreased in size compared to the earlier CT of 11/26/2014. 5. Bilateral pulmonary nodules, suspicious for metastatic disease, but stable compared to multiple prior chest CTs dating back to 11/26/2014.  6. Diffuse coronary artery calcifications.  Heart size is normal. 7. Bilateral nephrolithiasis. Electronically Signed: By: Franki Cabot M.D. On: 08/04/2015 17:05   Dg Chest Port 1 View  08/06/2015  CLINICAL DATA:  81 year old male with lung cancer status post radiation and right pneumothorax. Subsequent encounter. EXAM: PORTABLE CHEST 1 VIEW COMPARISON:  08/05/2015.  Chest CT 08/04/2015, and earlier FINDINGS: Portable AP semi upright view at 0629 hours. Stable right chest tube. The side hole projects over the right lateral ribs. Small volume of right chest wall subcutaneous gas is stable. Right pneumothorax appears  regressed with small right apical residual. Right hilar and perihilar mass like opacity is stable. Stable cardiac size and mediastinal contours. Left lung is stable with no acute left Side opacity. IMPRESSION: 1. Stable right chest tube, side hole projects over the lateral ribs and might be extrapleural. 2. Small residual right pneumothorax. 3. Otherwise stable chest. Electronically Signed   By: Genevie Ann M.D.   On: 08/06/2015 07:45   Portable Chest 1 View  08/05/2015  CLINICAL DATA:  Pneumothorax EXAM: PORTABLE CHEST 1 VIEW COMPARISON:  Radiograph 08/04/2015 CT, 08/04/2015 FINDINGS: Normal cardiac silhouette. RIGHT chest tube in place. There is RIGHT perihilar consolidation not changed. Small RIGHT apical pneumothorax measures 10 mm from the chest wall compared to 10 mm on prior. LEFT lung is clear. IMPRESSION: 1. Small RIGHT apical pneumothorax with chest tube in place. No change. 2. RIGHT perihilar consolidation again demonstrated. Electronically Signed   By: Suzy Bouchard M.D.   On: 08/05/2015 08:21   Dg Chest Port 1 View  08/04/2015  CLINICAL DATA:  81 year old male with a history of chest tube placement. History of lung carcinoma, COPD, atrial fibrillation. EXAM: PORTABLE CHEST 1 VIEW COMPARISON:  CT 08/04/2015 FINDINGS: Interval placement of right-sided thoracostomy tube, terminating near the hilum. Resolution of right-sided pneumothorax. Mixed interstitial and airspace disease on the right is similar to prior. Chronic interstitial opacities of the left are unchanged. No left-sided pneumothorax. Cardiomediastinal silhouette unchanged. IMPRESSION: Resolution of right-sided pneumothorax status post large bore thoracostomy tube placement. Similar appearance of mixed interstitial and airspace disease. Signed, Dulcy Fanny. Earleen Newport, DO [facility] Electronically Signed   By: Corrie Mckusick D.O.   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  5- atrial fibrillation  6- chronic prednisone use.    CURRENT  - subjectively better. Objectively CXR improved ptx but concern that chest tube air port might be out of chest  Plans:  - repeat portable cxr stat/urgen - if out, call CVTS  hold Xarelto now - continue bronchodilators > symbicort and spiriva. If unable to take / tolerate the can change to nebulized regimen.  - continue prednisone '20mg'$  daily, his prior chronic dose.  - continue daily lasix - continue mucinex-D   Up[dated patient and family   Dr. Brand Males, M.D., [facility].C.P Pulmonary and Critical Care Medicine Staff Physician [place] [facility] Pager: [phone], If no answer or between  15:00h - 7:00h: call [phone]  08/06/2015 12:26 PM

## 2015-08-06 NOTE — Progress Notes (Signed)
Subjective:  Says he feels better today.  Developed some hypotension last night and diltiazem transiently stopped.  Restarted at low dose because rate went up this morning.  Was given 2 doses of Lanoxin earlier.  Objective:  Vital Signs in the last 24 hours: BP 112/67 mmHg  Pulse 103  Temp(Src) 97.4 F (36.3 C) (Axillary)  Resp 25  Ht '5\' 9"'$  (1.753 m)  Wt 84.5 kg (186 lb 4.6 oz)  BMI 27.50 kg/m2  SpO2 100%  Physical Exam: Pleasant male sitting up at bedside in no acute distress  Lungs: Reduced breath sounds at the right base Cardiac:  Irregular rhythm, normal S1 and S2, no S3, no murmur Abdomen:  Soft, nontender, no masses Extremities:  No edema present  Intake/Output from previous day: 10/30 0701 - 10/31 0700 In: 593.9 [I.V.:43.9; IV Piggyback:50] Out: 1400 [Urine:800; Chest Tube:600] Weight Filed Weights   08/04/15 1255 08/04/15 1930  Weight: 83.207 kg (183 lb 7 oz) 84.5 kg (186 lb 4.6 oz)    Lab Results: Basic Metabolic Panel:  Recent Labs  08/05/15 0450 08/06/15 0400  NA 138 137  K 3.8 4.7  CL 97* 101  CO2 32 29  GLUCOSE 104* 110*  BUN 38* 44*  CREATININE 1.23 1.27*    CBC:  Recent Labs  08/05/15 0450 08/06/15 0400  WBC 10.4 12.9*  NEUTROABS  --  12.0*  HGB 11.7* 10.5*  HCT 38.9* 34.5*  MCV 84.7 84.4  PLT 161 133*    BNP    Component Value Date/Time   BNP 268.1* 05/16/2015 0458   Telemetry: Atrial fibrillation rate around 115  Assessment/Plan:  1.  Chronic atrial fibrillation with some increase in rate 2.  Recent chest tube insertion for pneumothorax 3.  Ascending aortic aneurysm 4.  Severe COPD  RECOMMENDATION: Additional digoxin because of the borderline blood pressure.  Continue low-dose Cardizem.  Convert to oral therapy when stable.      Kerry Hough  MD [facility] Cardiology  08/06/2015, 9:15 AM

## 2015-08-06 NOTE — Progress Notes (Signed)
[facility] Progress Note Patient Name: [name] DOB: 12-14-1932 MRN: [id number]   Date of Service  08/06/2015  HPI/Events of Note  Toradol fell off MAR d/t duration of 5 days. Creatinine = 1.27 which is stable.   eICU Interventions  Will renew Toradol 25 mg IV Q 6 hours PRN order. Follow Creatinine closely.      Intervention Category Intermediate Interventions: Pain - evaluation and management  Lugene Hitt Eugene 08/06/2015, 11:46 PM

## 2015-08-07 ENCOUNTER — Inpatient Hospital Stay (HOSPITAL_COMMUNITY): Payer: Medicare Other

## 2015-08-07 DIAGNOSIS — J9381 Chronic pneumothorax: Secondary | ICD-10-CM

## 2015-08-07 LAB — PROCALCITONIN: PROCALCITONIN: 0.55 ng/mL

## 2015-08-07 MED ORDER — DIGOXIN 250 MCG PO TABS
0.2500 mg | ORAL_TABLET | Freq: Every day | ORAL | Status: DC
  Administered 2015-08-07 – 2015-08-11 (×5): 0.25 mg via ORAL
  Filled 2015-08-07 (×6): qty 1

## 2015-08-07 MED ORDER — DILTIAZEM HCL 60 MG PO TABS
60.0000 mg | ORAL_TABLET | Freq: Four times a day (QID) | ORAL | Status: DC
  Administered 2015-08-07 – 2015-08-08 (×4): 60 mg via ORAL
  Filled 2015-08-07 (×8): qty 1

## 2015-08-07 NOTE — Progress Notes (Addendum)
[address]       [place] [address]             [phone]               Subjective: Sore at CT site. Coughing up a lot of yellowish sputum.    Objective: Vital signs in last 24 hours: Patient Vitals for the past 24 hrs:  BP Temp Temp src Pulse Resp SpO2 Weight  08/07/15 0620 - - - - - - 186 lb 4.6 oz (84.5 kg)  08/07/15 0412 - 98.3 F (36.8 C) Oral - - - -  08/06/15 2300 (!) 103/52 mmHg - - (!) 45 17 100 % -  08/06/15 2255 - 98.1 F (36.7 C) Oral - - - -  08/06/15 2156 - - - - - 100 % -  08/06/15 2000 103/69 mmHg - - (!) 104 (!) 23 100 % -  08/06/15 1952 - 97.9 F (36.6 C) Oral - - - -  08/06/15 1930 117/67 mmHg - - 92 15 100 % -  08/06/15 1900 124/77 mmHg - - (!) 52 (!) 24 100 % -  08/06/15 1600 (!) 113/53 mmHg - - - (!) 26 - -  08/06/15 1546 - 97.8 F (36.6 C) Oral - - - -  08/06/15 1530 127/77 mmHg - - - (!) 24 - -  08/06/15 1500 95/61 mmHg - - (!) 28 (!) 24 100 % -  08/06/15 1430 102/70 mmHg - - (!) 49 (!) 23 100 % -  08/06/15 1400 102/63 mmHg - - (!) 103 (!) 30 100 % -  08/06/15 1330 (!) 136/58 mmHg - - (!) 106 (!) 28 98 % -  08/06/15 1300 112/71 mmHg - - 80 20 100 % -  08/06/15 1230 100/68 mmHg 97.4 F (36.3 C) Axillary 75 (!) 25 98 % -  08/06/15 1200 125/85 mmHg - - (!) 109 (!) 25 100 % -  08/06/15 1130 125/84 mmHg - - (!) 102 (!) 22 100 % -  08/06/15 1101 105/70 mmHg - - (!) 109 (!) 23 96 % -  08/06/15 1100 - 97.4 F (36.3 C) Axillary - - - -  08/06/15 1030 111/64 mmHg - - - (!) 23 - -  08/06/15 1000 (!) 128/107 mmHg - - 69 (!) 24 94 % -  08/06/15 0930 120/66 mmHg - - 89 (!) 25 96 % -  08/06/15 0918 - - - - - 100 % -  08/06/15 0900 98/63 mmHg - - (!) 120 17 98 % -  08/06/15 0830 112/67 mmHg - - (!) 103 (!) 25 100 % -  08/06/15 0800 (!) 94/59 mmHg - - 97 (!) 27 100 % -   Current Weight  08/07/15 186 lb 4.6 oz (84.5 kg)     Intake/Output from previous day: 10/31 0701 - 11/01 0700 In: 160 [P.O.:120; I.V.:40] Out: 645  [Urine:625; Chest Tube:20]    PHYSICAL EXAM:  Heart: Irr irr, rates 90-110s Lungs: Coarse rhonchi bilaterally Chest tube: No air leak    Lab Results: CBC: Recent Labs  08/05/15 0450 08/06/15 0400  WBC 10.4 12.9*  HGB 11.7* 10.5*  HCT 38.9* 34.5*  PLT 161 133*   BMET:  Recent Labs  08/05/15 0450 08/06/15 0400  NA 138 137  K 3.8 4.7  CL 97* 101  CO2 32 29  GLUCOSE 104* 110*  BUN 38* 44*  CREATININE 1.23 1.27*  CALCIUM 9.2 9.0  PT/INR: No results for input(s): LABPROT, INR in the last 72 hours.    Assessment/Plan: S/P   R CT placement- CT with no air leak with cough, CXR with stable tiny apical ptx. Hopefully can d/c CT soon. Medical issues per pulm/CCM, cardiology.   LOS: 3 days    COLLINS,GINA H 08/07/2015  I have seen and examined the patient and agree with the assessment and plan as outlined.  Leave tube in place at least one more day.  Will tentatively plan to remove it tomorrow if no air leak and CXR stable.  Will need overnight observation and repeat CXR the next day prior to d/c due to risks of recurrent PTX.  I spent in excess of 15 minutes during the conduct of this hospital encounter and >50% of this time involved direct face-to-face encounter with the patient for counseling and/or coordination of their care.   Rexene Alberts, MD 08/07/2015 11:39 AM

## 2015-08-07 NOTE — Progress Notes (Signed)
Subjective:  Says breathing is better.  Not SOB at present. A fib rate still up but better.    Objective:  Vital Signs in the last 24 hours: BP 114/81 mmHg  Pulse 45  Temp(Src) 97.4 F (36.3 C) (Oral)  Resp 30  Ht '5\' 9"'$  (1.753 m)  Wt 84.5 kg (186 lb 4.6 oz)  BMI 27.50 kg/m2  SpO2 100%  Physical Exam: Pleasant male sitting up at bedside in no acute distress  Lungs: Reduced breath sounds at the right base Cardiac:  Irregular rhythm, normal S1 and S2, no S3, no murmur Abdomen:  Soft, nontender, no masses Extremities:  No edema present  Intake/Output from previous day: 10/31 0701 - 11/01 0700 In: 160 [P.O.:120; I.V.:40] Out: 645 [Urine:625; Chest Tube:20] Weight Filed Weights   08/04/15 1255 08/04/15 1930 08/07/15 0620  Weight: 83.207 kg (183 lb 7 oz) 84.5 kg (186 lb 4.6 oz) 84.5 kg (186 lb 4.6 oz)    Lab Results: Basic Metabolic Panel:  Recent Labs  08/05/15 0450 08/06/15 0400  NA 138 137  K 3.8 4.7  CL 97* 101  CO2 32 29  GLUCOSE 104* 110*  BUN 38* 44*  CREATININE 1.23 1.27*    CBC:  Recent Labs  08/05/15 0450 08/06/15 0400  WBC 10.4 12.9*  NEUTROABS  --  12.0*  HGB 11.7* 10.5*  HCT 38.9* 34.5*  MCV 84.7 84.4  PLT 161 133*    BNP    Component Value Date/Time   BNP 268.1* 05/16/2015 0458   Telemetry: Atrial fibrillation rate around 115  Assessment/Plan:  1.  Chronic atrial fibrillation with some increase in rate 2.  Recent chest tube insertion for pneumothorax residual leak 3.  Ascending aortic aneurysm 4.  Severe COPD  RECOMMENDATION: Convert to oral therapy add dig to help with rate control.   Kerry Hough  MD [facility] Cardiology  08/07/2015, 8:55 AM

## 2015-08-07 NOTE — Care Management Important Message (Signed)
Important Message  Patient Details  Name: [name] MRN: [id number] Date of Birth: 1932/10/24   Medicare Important Message Given:  Yes-second notification given    [name] May 08/07/2015, 10:13 AM

## 2015-08-07 NOTE — Progress Notes (Addendum)
Name: [name] MRN: [id number] DOB: September 01, 1933    ADMISSION DATE:  08/04/2015  REFERRING MD :  [facility] ED  CHIEF COMPLAINT:  Progressive dyspnea  BRIEF PATIENT DESCRIPTION:    81 year old former smoker (130 pack years) with a history of stage IIIa squamous cell lung cancer status post concurrent chemoradiation, last dose 02/2015. He has been on chronic prednisone for many years. PFT from 10/25 show very severe obstruction, FEV1 0.99L (36% predicted). He has been experiencing progressive dyspnea for several months, more severe over the last 4 weeks.  On 10/25 he was started on spiriva, decreased his chronic pred to '10mg'$  daily. He has worsened in the interim. CXR on ED eval 10/29 showed 25% R pneumothorax. He is on xarelto for Atrial fibrillation .    SIGNIFICANT EVENTS  Spiriva added to Symbicort 10/25 Prednisone decrease from '10mg'$  bid to '10mg'$  qd 10/25  CT chest 10/29 >> moderate / large R PTX without shift, significant emphysema, stable R perihilar mass, B pulmonary nodules  08/05/15 Spiriva added to Symbicort 10/25 Prednisone decrease from '10mg'$  bid to '10mg'$  qd 10/25  Coughing purulent mucus, no blood. Now denies pain. Discussed w nurse. Heart rate ranges up to 170   08/06/15 - feeling better. Thre is concern on CXR that chest tube port might be out. Grandughter repors he is doing better. TAchycardia better. Dr Wynonia Lawman rounded from cards. Anticoag on hold given chest tube 2d ago   SUBJECTIVE/OVERNIGHT/INTERVAL HX 08/07/15 = Sore at chest tube site. Toradol course completed. Yellow sputum. Concern for chest tube port out in extrathoracic area on cxr but d;/w APP of CVTS - bedside exam NOT c/w this. Possible chest tube removeal today per CVTS notes/patient  VITAL SIGNS: Temp:  [97.4 F (36.3 C)-98.3 F (36.8 C)] 97.4 F (36.3 C) (11/01 0700) Pulse Rate:  [28-109] 45 (10/31 2300) Resp:  [15-30] 30 (11/01 0730) BP: (95-136)/(52-107) 114/81 mmHg (11/01 0730) SpO2:  [94 %-100 %]  100 % (11/01 0742) Weight:  [84.5 kg (186 lb 4.6 oz)] 84.5 kg (186 lb 4.6 oz) (11/01 0620)  PHYSICAL EXAMINATION: General:  Elderly man, in bed. Watching TV Neuro:  A&O x 3, non focal HEENT:  Op clear, no lesions Cardiovascular:  Tachy, no M Lungs:   Rt crackles. Purse lip breathing on o2.  No subcut air. CHEST TUBE TIDALNG +. NO BUBBLES Abdomen: benign Musculoskeletal:  No deformities Skin:  Multiple ecchymoses.   PULMONARY No results for input(s): PHART, PCO2ART, PO2ART, HCO3, TCO2, O2SAT in the last 168 hours.  Invalid input(s): PCO2, PO2  CBC  Recent Labs Lab 08/04/15 1309 08/05/15 0450 08/06/15 0400  HGB 12.0* 11.7* 10.5*  HCT 39.5 38.9* 34.5*  WBC 14.3* 10.4 12.9*  PLT 177 161 133*    COAGULATION No results for input(s): INR in the last 168 hours.  CARDIAC  No results for input(s): TROPONINI in the last 168 hours. No results for input(s): PROBNP in the last 168 hours.   CHEMISTRY  Recent Labs Lab 08/04/15 1309 08/05/15 0450 08/06/15 0400  NA 136 138 137  K 3.8 3.8 4.7  CL 98* 97* 101  CO2 28 32 29  GLUCOSE 115* 104* 110*  BUN 35* 38* 44*  CREATININE 1.41* 1.23 1.27*  CALCIUM 9.1 9.2 9.0  MG  --   --  2.2   Estimated Creatinine Clearance: 46.4 mL/min (by C-G formula based on Cr of 1.27).   LIVER No results for input(s): AST, ALT, ALKPHOS, BILITOT, PROT, ALBUMIN, INR in the  last 168 hours.   INFECTIOUS No results for input(s): LATICACIDVEN, PROCALCITON in the last 168 hours.   ENDOCRINE CBG (last 3)  No results for input(s): GLUCAP in the last 72 hours.       IMAGING x48h  - image(s) personally visualized  -   highlighted in bold Dg Chest Port 1 View  08/07/2015  CLINICAL DATA:  Spontaneous pneumothorax, chest tube EXAM: PORTABLE CHEST 1 VIEW COMPARISON:  08/06/2015 FINDINGS: Right chest tube remains in place. The side port appears to be in the chest wall, similar to prior study. Small residual right apical pneumothorax is stable. Mild  cardiomegaly. Patchy opacities throughout the right lung. No confluent opacity on the left. IMPRESSION: Stable small right apical pneumothorax with right chest tube remain in stable position. The side port appears to be in the chest wall soft tissues. Stable patchy airspace disease in the right lung. Electronically Signed   By: Rolm Baptise M.D.   On: 08/07/2015 07:39   Dg Chest Port 1 View  08/06/2015  CLINICAL DATA:  History of pneumothorax.  COPD. EXAM: PORTABLE CHEST 1 VIEW COMPARISON:  08/06/2015 FINDINGS: Right chest tube is again noted. Unchanged in position. The side port of the chest tube appears unchanged from the previous exam and may be external to the pleura. Stable right apical pneumothorax. New basilar component to the right pneumothorax is identified on the current exam Advanced similar appearance of right perihilar lung mass and diffuse interstitial and airspace markings throughout the right lung. Left lung is clear. IMPRESSION: 1. No change in position of right chest tube compared with previous exam. 2. Stable small right apical pneumothorax. 3. No change in aeration to the right lung compared with previous exam. Electronically Signed   By: Kerby Moors M.D.   On: 08/06/2015 13:20   Dg Chest Port 1 View  08/06/2015  CLINICAL DATA:  81 year old male with lung cancer status post radiation and right pneumothorax. Subsequent encounter. EXAM: PORTABLE CHEST 1 VIEW COMPARISON:  08/05/2015.  Chest CT 08/04/2015, and earlier FINDINGS: Portable AP semi upright view at 0629 hours. Stable right chest tube. The side hole projects over the right lateral ribs. Small volume of right chest wall subcutaneous gas is stable. Right pneumothorax appears regressed with small right apical residual. Right hilar and perihilar mass like opacity is stable. Stable cardiac size and mediastinal contours. Left lung is stable with no acute left Side opacity. IMPRESSION: 1. Stable right chest tube, side hole projects  over the lateral ribs and might be extrapleural. 2. Small residual right pneumothorax. 3. Otherwise stable chest. Electronically Signed   By: Genevie Ann M.D.   On: 08/06/2015 07:45         ASSESSMENT / PLAN: AT ADMIT 1- GOLD D COPD 2- Hx Squamous cell lung cancer s/p chemoradiation and decrease in size R hilar mass 3- enlarging R pneumothorax 4- probable R radiation pneumonitis with residual scar.  5- atrial fibrillation - dig added by cards 08/07/2015 6- chronic prednisone use.    CURRENT  - subjectively better in terms of dyspnea. Some soreness at site of right chest tube. Objectively CXR improved ptx but concern that chest tube air port might be out of chest but physical exam not c/w this  Plans:  - check sputum,. And PCT cntinue ceftriazone and aziothro -  hold Xarelto now - continue bronchodilators > symbicort and spiriva. If unable to take / tolerate the can change to nebulized regimen.  - continue prednisone '20mg'$  daily, his  prior chronic dose.  - continue daily lasix - continue mucinex-D - chest tube removal per CVTS  Up[dated patient    Dr. Brand Males, M.D., [facility].C.P Pulmonary and Critical Care Medicine Staff Physician [facility] Pager: [phone], If no answer or between  15:00h - 7:00h: call [phone]  08/07/2015 9:39 AM

## 2015-08-08 ENCOUNTER — Inpatient Hospital Stay (HOSPITAL_COMMUNITY): Payer: Medicare Other

## 2015-08-08 DIAGNOSIS — Z9689 Presence of other specified functional implants: Secondary | ICD-10-CM | POA: Insufficient documentation

## 2015-08-08 DIAGNOSIS — J440 Chronic obstructive pulmonary disease with acute lower respiratory infection: Secondary | ICD-10-CM

## 2015-08-08 DIAGNOSIS — I482 Chronic atrial fibrillation: Secondary | ICD-10-CM

## 2015-08-08 DIAGNOSIS — J189 Pneumonia, unspecified organism: Secondary | ICD-10-CM

## 2015-08-08 DIAGNOSIS — Z8709 Personal history of other diseases of the respiratory system: Secondary | ICD-10-CM

## 2015-08-08 LAB — PROCALCITONIN: PROCALCITONIN: 0.45 ng/mL

## 2015-08-08 MED ORDER — DILTIAZEM HCL 60 MG PO TABS
90.0000 mg | ORAL_TABLET | Freq: Four times a day (QID) | ORAL | Status: DC
  Administered 2015-08-08 – 2015-08-11 (×13): 90 mg via ORAL
  Filled 2015-08-08 (×4): qty 1
  Filled 2015-08-08: qty 2
  Filled 2015-08-08 (×8): qty 1

## 2015-08-08 NOTE — Progress Notes (Signed)
Pt sitting on side of bed. Pt asked if he would like help to get in bed to prop feet up to reduce swelling. Pt offered medication again. Grimacing noted and congested cough. He states he is fine and does not want medication. Will continue to monitor.

## 2015-08-08 NOTE — Progress Notes (Signed)
Pt sitting on side of bed. Offered a bath. Pt states he wants to wait until later. Pt offered pain medication. States he is hurting but not enough for medicine. Will continue to monitor.

## 2015-08-08 NOTE — Progress Notes (Signed)
PT Cancellation Note  Patient Details Name: CHRISTIA COAXUM MRN: [id number] DOB: 14-Jan-1933   Cancelled Treatment:    Reason Eval/Treat Not Completed: Medical issues which prohibited therapy (Pt is on strict bed rest).  PT will continue to follow acutely but will need updated activity orders before completing evaluation. Thank you for this order.  Joslyn Hy PT, DPT [phone] Pager: [phone] 08/08/2015, 8:43 AM

## 2015-08-08 NOTE — Progress Notes (Signed)
Pt still sitting on side of bed. Pt allows me to help him to get in bed. He states he will take medication at this time, but still will not agree to bath. Pt told his family would like for him to have a bath, but he still refuses. Will continue to monitor.

## 2015-08-08 NOTE — Progress Notes (Signed)
Subjective:  He is off of IV Cardizem and on oral medicines now.  Atrial fibrillation still slightly above 100.  Objective:  Vital Signs in the last 24 hours: BP 132/77 mmHg  Pulse 72  Temp(Src) 98.2 F (36.8 C) (Oral)  Resp 20  Ht '5\' 9"'$  (1.753 m)  Wt 84.499 kg (186 lb 4.6 oz)  BMI 27.50 kg/m2  SpO2 99%  Physical Exam: Pleasant male sitting up at bedside in no acute distress  Lungs: Reduced breath sounds at the right base Cardiac:  Irregular rhythm, normal S1 and S2, no S3, no murmur Abdomen:  Soft, nontender, no masses Extremities:  No edema present  Intake/Output from previous day: 11/01 0701 - 11/02 0700 In: -  Out: 540 [Urine:500; Chest Tube:40] Weight Filed Weights   08/04/15 1930 08/07/15 0620 08/08/15 0400  Weight: 84.5 kg (186 lb 4.6 oz) 84.5 kg (186 lb 4.6 oz) 84.499 kg (186 lb 4.6 oz)    Lab Results: Basic Metabolic Panel:  Recent Labs  08/06/15 0400  NA 137  K 4.7  CL 101  CO2 29  GLUCOSE 110*  BUN 44*  CREATININE 1.27*    CBC:  Recent Labs  08/06/15 0400  WBC 12.9*  NEUTROABS 12.0*  HGB 10.5*  HCT 34.5*  MCV 84.4  PLT 133*    BNP    Component Value Date/Time   BNP 268.1* 05/16/2015 0458   Telemetry: Atrial fibrillation rate around 115  Assessment/Plan:  1.  Chronic atrial fibrillation with some increase in rate still despite the addition of dig. 2.  Recent chest tube insertion for pneumothorax residual leak 3.  Ascending aortic aneurysm 4.  Severe COPD  RECOMMENDATION:  Continue digoxin.  He will need to be discharged on 0.125 mg daily as of age and renal function.  Increase diltiazem to 90 mg every 6 hours.  Kerry Hough  MD [facility]  08/08/2015, 9:17 AM

## 2015-08-08 NOTE — Progress Notes (Addendum)
       [address]       [place] [address]             [phone]               Subjective: Feels well, no complaints. Not as much coughing overnight.   Objective: Vital signs in last 24 hours: Patient Vitals for the past 24 hrs:  BP Temp Temp src Pulse Resp SpO2 Weight  08/08/15 0400 132/77 mmHg 98.2 F (36.8 C) Oral 72 20 99 % 186 lb 4.6 oz (84.499 kg)  08/07/15 2326 123/73 mmHg - - - - - -  08/07/15 2306 - 97.6 F (36.4 C) Oral - - - -  08/07/15 2041 - - - - - 97 % -  08/07/15 2008 - 98.1 F (36.7 C) Oral - - - -  08/07/15 1700 - 97.3 F (36.3 C) Oral - - - -  08/07/15 1540 103/67 mmHg - - - 20 - -  08/07/15 1145 - 97.8 F (36.6 C) Oral - - - -  08/07/15 1112 102/73 mmHg - - - (!) 27 - -   Current Weight  08/08/15 186 lb 4.6 oz (84.499 kg)     Intake/Output from previous day: 11/01 0701 - 11/02 0700 In: -  Out: 540 [Urine:500; Chest Tube:40]    PHYSICAL EXAM:  Heart: Irr irr Lungs: Coarse rhonchi bilaterally Chest tube: No air leak    Lab Results: CBC: Recent Labs  08/06/15 0400  WBC 12.9*  HGB 10.5*  HCT 34.5*  PLT 133*   BMET:  Recent Labs  08/06/15 0400  NA 137  K 4.7  CL 101  CO2 29  GLUCOSE 110*  BUN 44*  CREATININE 1.27*  CALCIUM 9.0    PT/INR: No results for input(s): LABPROT, INR in the last 72 hours.    Assessment/Plan: S/P  R CT for spontaneous ptx- CXR not done yet this am. CT with no air leak, scant output.  Will follow up CXR and hopefully can d/c CT today. Continue medical care per CCM, cardiology.    LOS: 4 days    COLLINS,GINA H 08/08/2015  I have seen and examined the patient and agree with the assessment and plan as outlined.  Unfortunately patient did not get CXR done as ordered.  I spent in excess of 15 minutes during the conduct of this hospital encounter and >50% of this time involved direct face-to-face encounter with the patient for counseling and/or coordination of their  care.   Rexene Alberts, MD 08/08/2015 8:59 AM

## 2015-08-08 NOTE — Progress Notes (Signed)
Name: [name] MRN: [id number] DOB: Sep 10, 1933    ADMISSION DATE:  08/04/2015  REFERRING MD :  [facility] ED  CHIEF COMPLAINT:  Progressive dyspnea  BRIEF PATIENT DESCRIPTION:    81 year old former smoker (130 pack years) with a history of stage IIIa squamous cell lung cancer status post concurrent chemoradiation, last dose 02/2015. He has been on chronic prednisone for many years. PFT from 10/25 show very severe obstruction, FEV1 0.99L (36% predicted). He has been experiencing progressive dyspnea for several months, more severe over the last 4 weeks.  On 10/25 he was started on spiriva, decreased his chronic pred to '10mg'$  daily. He has worsened in the interim. CXR on ED eval 10/29 showed 25% R pneumothorax. He is on xarelto for Atrial fibrillation .    SIGNIFICANT EVENTS  Spiriva added to Symbicort 10/25 Prednisone decrease from '10mg'$  bid to '10mg'$  qd 10/25  CT chest 10/29 >> moderate / large R PTX without shift, significant emphysema, stable R perihilar mass, B pulmonary nodules   SUBJECTIVE/OVERNIGHT/INTERVAL HX Awaiting CXR from this am.  NO air leak on CT.  HR stable on PO cardizem.   VITAL SIGNS: Temp:  [97.3 F (36.3 C)-98.2 F (36.8 C)] 98.2 F (36.8 C) (11/02 0400) Pulse Rate:  [72] 72 (11/02 0400) Resp:  [20-27] 20 (11/02 0400) BP: (102-132)/(67-77) 132/77 mmHg (11/02 0400) SpO2:  [97 %-99 %] 99 % (11/02 0400) Weight:  [186 lb 4.6 oz (84.499 kg)] 186 lb 4.6 oz (84.499 kg) (11/02 0400)  PHYSICAL EXAMINATION:  General:  Elderly man, in bed. NAD Neuro:  A&O x 3, non focal HEENT:  Op clear, no lesions Cardiovascular:  Tachy, no M Lungs:  Resps even on labored on RA, diminished bases, few exp wheeze, rhonchus. R CT POS tidaling, no air leak.  Abdomen: benign Musculoskeletal:  No deformities Skin:  Multiple ecchymoses.   PULMONARY No results for input(s): PHART, PCO2ART, PO2ART, HCO3, TCO2, O2SAT in the last 168 hours.  Invalid input(s): PCO2, PO2  CBC  Recent  Labs Lab 08/04/15 1309 08/05/15 0450 08/06/15 0400  HGB 12.0* 11.7* 10.5*  HCT 39.5 38.9* 34.5*  WBC 14.3* 10.4 12.9*  PLT 177 161 133*    COAGULATION No results for input(s): INR in the last 168 hours.  CARDIAC  No results for input(s): TROPONINI in the last 168 hours. No results for input(s): PROBNP in the last 168 hours.   CHEMISTRY  Recent Labs Lab 08/04/15 1309 08/05/15 0450 08/06/15 0400  NA 136 138 137  K 3.8 3.8 4.7  CL 98* 97* 101  CO2 28 32 29  GLUCOSE 115* 104* 110*  BUN 35* 38* 44*  CREATININE 1.41* 1.23 1.27*  CALCIUM 9.1 9.2 9.0  MG  --   --  2.2   Estimated Creatinine Clearance: 46.4 mL/min (by C-G formula based on Cr of 1.27).   LIVER No results for input(s): AST, ALT, ALKPHOS, BILITOT, PROT, ALBUMIN, INR in the last 168 hours.   INFECTIOUS  Recent Labs Lab 08/07/15 1107 08/08/15 0319  PROCALCITON 0.55 0.45     ENDOCRINE CBG (last 3)  No results for input(s): GLUCAP in the last 72 hours.     IMAGING x48h   Dg Chest Port 1 View  08/07/2015  CLINICAL DATA:  Spontaneous pneumothorax, chest tube EXAM: PORTABLE CHEST 1 VIEW COMPARISON:  08/06/2015 FINDINGS: Right chest tube remains in place. The side port appears to be in the chest wall, similar to prior study. Small residual right apical pneumothorax is  stable. Mild cardiomegaly. Patchy opacities throughout the right lung. No confluent opacity on the left. IMPRESSION: Stable small right apical pneumothorax with right chest tube remain in stable position. The side port appears to be in the chest wall soft tissues. Stable patchy airspace disease in the right lung. Electronically Signed   By: Rolm Baptise M.D.   On: 08/07/2015 07:39   Dg Chest Port 1 View  08/06/2015  CLINICAL DATA:  History of pneumothorax.  COPD. EXAM: PORTABLE CHEST 1 VIEW COMPARISON:  08/06/2015 FINDINGS: Right chest tube is again noted. Unchanged in position. The side port of the chest tube appears unchanged from the  previous exam and may be external to the pleura. Stable right apical pneumothorax. New basilar component to the right pneumothorax is identified on the current exam Advanced similar appearance of right perihilar lung mass and diffuse interstitial and airspace markings throughout the right lung. Left lung is clear. IMPRESSION: 1. No change in position of right chest tube compared with previous exam. 2. Stable small right apical pneumothorax. 3. No change in aeration to the right lung compared with previous exam. Electronically Signed   By: Kerby Moors M.D.   On: 08/06/2015 13:20       ASSESSMENT / PLAN:  Gold D COPD  Hx squamous cell lung ca - s/p chemo, xrt  R ptx  Probable R radiation pneumonitis  Afib  Chronic steroids    Plans:  - continue rocephin/azithro for now  - sputum culture pending  - cont hold xarelto  - continue bronchodilators > symbicort and spiriva - continue prednisone '20mg'$  daily, his prior chronic dose.  - continue daily lasix - HR control per cards - on PO cardizem, increased dig dose 11/2 - continue mucinex-D - chest tube removal per CVTS - suspect 11/2 if CXR ok     Nickolas Madrid, NP 08/08/2015  9:06 AM Pager: [phone] or [phone]

## 2015-08-09 ENCOUNTER — Inpatient Hospital Stay (HOSPITAL_COMMUNITY): Payer: Medicare Other

## 2015-08-09 DIAGNOSIS — Z789 Other specified health status: Secondary | ICD-10-CM

## 2015-08-09 LAB — PROCALCITONIN: PROCALCITONIN: 0.24 ng/mL

## 2015-08-09 LAB — EXPECTORATED SPUTUM ASSESSMENT W GRAM STAIN, RFLX TO RESP C

## 2015-08-09 LAB — EXPECTORATED SPUTUM ASSESSMENT W REFEX TO RESP CULTURE

## 2015-08-09 NOTE — Progress Notes (Signed)
PT Cancellation Note  Patient Details Name: [name] MRN: [id number] DOB: Mar 25, 1933   Cancelled Treatment:    Reason Eval/Treat Not Completed: Other (comment); spoke with pt and RN this AM.  Reports for CT removal and would be best to walk once tube out.  Will return later to complete PT eval.   Erleen Egner,CYNDI 08/09/2015, 10:12 AM  Magda Kiel, PT [phone] 08/09/2015

## 2015-08-09 NOTE — Progress Notes (Signed)
Name: [name] MRN: [id number] DOB: 11-06-1932    ADMISSION DATE:  08/04/2015  REFERRING MD :  [facility] ED  CHIEF COMPLAINT:  Progressive dyspnea  BRIEF PATIENT DESCRIPTION:    81 year old former smoker (130 pack years) with a history of stage IIIa squamous cell lung cancer status post concurrent chemoradiation, last dose 02/2015. He has been on chronic prednisone for many years. PFT from 10/25 show very severe obstruction, FEV1 0.99L (36% predicted). He has been experiencing progressive dyspnea for several months, more severe over the last 4 weeks.  On 10/25 he was started on spiriva, decreased his chronic pred to '10mg'$  daily. He has worsened in the interim. CXR on ED eval 10/29 showed 25% R pneumothorax. He is on xarelto for Atrial fibrillation .    SIGNIFICANT EVENTS  Spiriva added to Symbicort 10/25 Prednisone decrease from '10mg'$  bid to '10mg'$  qd 10/25  CT chest 10/29 >> moderate / large R PTX without shift, significant emphysema, stable R perihilar mass, B pulmonary nodules  112/16 Awaiting CXR from this am.  NO air leak on CT.  HR stable on PO cardizem.    SUBJECTIVE/OVERNIGHT/INTERVAL HX 08/09/15 - HR 80s. Patient says plan for chest tube removal later today by CVTS. Anxious to go home. Improved dyspnea. No ch est pain  Cards reporting eedema on their note  VITAL SIGNS: Temp:  [97.9 F (36.6 C)-98.7 F (37.1 C)] 97.9 F (36.6 C) (11/03 0740) Pulse Rate:  [56-113] 89 (11/03 0740) Resp:  [19-28] 25 (11/03 0740) BP: (100-124)/(53-88) 120/70 mmHg (11/03 0740) SpO2:  [91 %-100 %] 99 % (11/03 0915) Weight:  [85.5 kg (188 lb 7.9 oz)] 85.5 kg (188 lb 7.9 oz) (11/03 0354)  PHYSICAL EXAMINATION:  General:  Elderly man, in bed. NAD. Looks beter Neuro:  A&O x 3, non focal HEENT:  Op clear, no lesions Cardiovascular:  Tachy, no M Lungs:  Resps even on labored on RA, diminished bases, few exp wheeze, rhonchus. R CT POS tidaling, no air leak.  Abdomen: benign Musculoskeletal:  No  deformities Skin:  Multiple ecchymoses.   PULMONARY No results for input(s): PHART, PCO2ART, PO2ART, HCO3, TCO2, O2SAT in the last 168 hours.  Invalid input(s): PCO2, PO2  CBC  Recent Labs Lab 08/04/15 1309 08/05/15 0450 08/06/15 0400  HGB 12.0* 11.7* 10.5*  HCT 39.5 38.9* 34.5*  WBC 14.3* 10.4 12.9*  PLT 177 161 133*    COAGULATION No results for input(s): INR in the last 168 hours.  CARDIAC  No results for input(s): TROPONINI in the last 168 hours. No results for input(s): PROBNP in the last 168 hours.   CHEMISTRY  Recent Labs Lab 08/04/15 1309 08/05/15 0450 08/06/15 0400  NA 136 138 137  K 3.8 3.8 4.7  CL 98* 97* 101  CO2 28 32 29  GLUCOSE 115* 104* 110*  BUN 35* 38* 44*  CREATININE 1.41* 1.23 1.27*  CALCIUM 9.1 9.2 9.0  MG  --   --  2.2   Estimated Creatinine Clearance: 50.3 mL/min (by C-G formula based on Cr of 1.27).   LIVER No results for input(s): AST, ALT, ALKPHOS, BILITOT, PROT, ALBUMIN, INR in the last 168 hours.   INFECTIOUS  Recent Labs Lab 08/07/15 1107 08/08/15 0319 08/09/15 0444  PROCALCITON 0.55 0.45 0.24     ENDOCRINE CBG (last 3)  No results for input(s): GLUCAP in the last 72 hours.     IMAGING x48h   Dg Chest 2 View  08/09/2015  CLINICAL DATA:  Chest  tube EXAM: CHEST  2 VIEW COMPARISON:  Yesterday FINDINGS: Patchy airspace disease throughout the right lung is stable. Right chest tube remains in place. The side hole is just outside of the thorax. Tiny right basilar lateral pneumothorax is present. There is an air-fluid level in the right upper posterior thorax region which may represent a small loculated apical pneumothorax. Irregular opacities at the left base likely chronic. No left pneumothorax. Upper normal heart size. IMPRESSION: Stable patchy airspace disease in the right lung. Stable right chest tube with a tiny right basilar pneumothorax There may be a loculated medial right apical pneumothorax with an air-fluid  level. Electronically Signed   By: Marybelle Killings M.D.   On: 08/09/2015 07:40   Dg Chest 2 View  08/08/2015  CLINICAL DATA:  Right chest pain.  Pneumothorax. EXAM: CHEST  2 VIEW COMPARISON:  08/07/2015 FINDINGS: Right chest tube remains in stable position. No apical component of the pneumothorax visualize currently, but there is a tiny lateral basilar pneumothorax. Stable patchy right lung airspace disease. No confluent opacity on the left. IMPRESSION: Stable position of the right chest tube. Tiny right basilar lateral pneumothorax. Stable patchy right lung airspace disease. Electronically Signed   By: Rolm Baptise M.D.   On: 08/08/2015 09:53   Recent Results (from the past 240 hour(s))  MRSA PCR Screening     Status: None   Collection Time: 08/04/15  9:53 PM  Result Value Ref Range Status   MRSA by PCR NEGATIVE NEGATIVE Final    Comment:        The GeneXpert MRSA Assay (FDA approved for NASAL specimens only), is one component of a comprehensive MRSA colonization surveillance program. It is not intended to diagnose MRSA infection nor to guide or monitor treatment for MRSA infections.        ASSESSMENT / PLAN:  Gold D COPD  Hx squamous cell lung ca - s/p chemo, xrt  R ptx  Probable R radiation pneumonitis  Afib  Chronic steroids  Pain right chest due to chest tube   Plans:  - continue rocephin for now. DC azithro  - sputum culture pending  - cont hold xarelto  - continue bronchodilators > symbicort and spiriva - continue prednisone '20mg'$  daily, his prior chronic dose.  - continue daily lasix - on hold per cards  - get Duplex LE rule out DVT - HR control per cards - on PO cardizem  + dig per cards - continue mucinex-D - chest tube removal per CVTS - on  - pain control with tylenol prn and ultram prn   Dr. Brand Males, M.D., [facility].C.P Pulmonary and Critical Care Medicine Staff Physician [place] [facility] Pager: [phone], If  no answer or between  15:00h - 7:00h: call [phone]  08/09/2015 10:45 AM

## 2015-08-09 NOTE — Progress Notes (Signed)
UR COMPLETED  

## 2015-08-09 NOTE — Progress Notes (Addendum)
Subjective:  Says breathing is better.  Minimal cough.  No chest pain.  Is now complaining of some edema particularly of his right leg.  Diuretics have been stopped.  Objective:  Vital Signs in the last 24 hours: BP 120/70 mmHg  Pulse 89  Temp(Src) 97.9 F (36.6 C) (Oral)  Resp 25  Ht '5\' 9"'$  (1.753 m)  Wt 85.5 kg (188 lb 7.9 oz)  BMI 27.82 kg/m2  SpO2 99%  Physical Exam: Pleasant male sitting up at bedside in no acute distress  Lungs: Reduced breath sounds at the right base Cardiac:  Irregular rhythm, normal S1 and S2, no S3, no murmur Abdomen:  Soft, nontender, no masses Extremities:  2+ edema edema present of the right leg, trace of the left leg  Intake/Output from previous day: 11/02 0701 - 11/03 0700 In: 50 [IV Piggyback:50] Out: 250 [Urine:250] Weight Filed Weights   08/07/15 0620 08/08/15 0400 08/09/15 0354  Weight: 84.5 kg (186 lb 4.6 oz) 84.499 kg (186 lb 4.6 oz) 85.5 kg (188 lb 7.9 oz)   BNP    Component Value Date/Time   BNP 268.1* 05/16/2015 0458   Telemetry: Atrial fibrillation rate around 115  Assessment/Plan:  1.  Chronic atrial fibrillation currently controlled rate 2.  Recent chest tube insertion for pneumothorax  3.  Ascending aortic aneurysm 4.  Severe COPD 5.  Worsening edema today  may be venous disease or could be diastolic heart failure due to atrial fibrillation  RECOMMENDATION:  Increase diuretics.  [place] lab.  Continue diltiazem at a higher dose as well as lower dose Lanoxin.  Hopefully chest tube out soon.  Kerry Hough  MD [facility]  08/09/2015, 9:24 AM

## 2015-08-09 NOTE — Evaluation (Signed)
Physical Therapy Evaluation Patient Details Name: [name] MRN: [id number] DOB: 12-03-1932 Today's Date: 08/09/2015   History of Present Illness  81 yo man, hx severe COPD, squamous cell lung CA s/p chemoradiation, hypoxemia and chronic pred use. Seen in office 10/25 with slowly progressive dyspnea. Worsened over the next 3 days prompting ED eval 10/29. CXR showed new 25% R PTX. He is on Eliquis for A fib  Clinical Impression  Patient presents with decreased independence with mobility due to deficits listed in PT problem list.  He will benefit from skilled PT in the acute setting to allow return home with family assist and follow up HHPT.    Follow Up Recommendations Home health PT;Supervision for mobility/OOB    Equipment Recommendations  None recommended by PT    Recommendations for Other Services       Precautions / Restrictions Precautions Precautions: Fall      Mobility  Bed Mobility Overal bed mobility: Modified Independent                Transfers Overall transfer level: Needs assistance Equipment used: None Transfers: Sit to/from Stand Sit to Stand: Supervision         General transfer comment: cues for safety, slightly impulsive with multiple lines  Ambulation/Gait Ambulation/Gait assistance: Min guard Ambulation Distance (Feet): 50 Feet (and 30') Assistive device: None;1 person hand held assist Gait Pattern/deviations: Step-through pattern;Decreased stride length;Shuffle     General Gait Details: Patient with limited stride length and halting steps, held onto items in hallway with improved stability with hand hold assist; after first 71' pt dyspneic and rushing to get to chair  Stairs            Wheelchair Mobility    Modified Rankin (Stroke Patients Only)       Balance Overall balance assessment: Needs assistance         Standing balance support: No upper extremity supported Standing balance-Leahy Scale: Fair Standing  balance comment: supervision static balance, able to ambulate, but better with UE support                             Pertinent Vitals/Pain Pain Assessment: Faces Faces Pain Scale: Hurts little more Pain Location: chest tube sit with supine to sit Pain Descriptors / Indicators: Sore Pain Intervention(s): Monitored during session;Repositioned    Home Living Family/patient expects to be discharged to:: Private residence Living Arrangements: Other relatives (neice lives with pt at night) Available Help at Discharge: Family;Available PRN/intermittently Type of Home: House Home Access: Stairs to enter Entrance Stairs-Rails: Right;Left;Can reach both Entrance Stairs-Number of Steps: 3 Home Layout: One level Home Equipment: Walker - 2 wheels;Cane - single point;Shower seat      Prior Function Level of Independence: Independent         Comments: daughter cooks     Hand Dominance   Dominant Hand: Right    Extremity/Trunk Assessment               Lower Extremity Assessment: RLE deficits/detail;LLE deficits/detail RLE Deficits / Details: AROM WFL strength hip flexion 3+/5, knee extension 4/5 LLE Deficits / Details: AROM WFL strength hip flexion 3+/5, knee extension 4/5     Communication   Communication: No difficulties  Cognition Arousal/Alertness: Awake/alert Behavior During Therapy: WFL for tasks assessed/performed Overall Cognitive Status: Within Functional Limits for tasks assessed  General Comments      Exercises        Assessment/Plan    PT Assessment Patient needs continued PT services  PT Diagnosis Abnormality of gait;Generalized weakness   PT Problem List Decreased strength;Decreased activity tolerance;Decreased balance;Decreased mobility;Decreased knowledge of use of DME;Cardiopulmonary status limiting activity  PT Treatment Interventions DME instruction;Balance training;Gait training;Stair  training;Functional mobility training;Patient/family education;Therapeutic activities;Therapeutic exercise   PT Goals (Current goals can be found in the Care Plan section) Acute Rehab PT Goals Patient Stated Goal: To go home, get stronger PT Goal Formulation: With patient Time For Goal Achievement: 08/23/15 Potential to Achieve Goals: Good    Frequency Min 3X/week   Barriers to discharge        Co-evaluation               End of Session Equipment Utilized During Treatment: Gait belt;Oxygen Activity Tolerance: Patient limited by fatigue Patient left: in bed;with call bell/phone within reach           Time: 1450-1513 PT Time Calculation (min) (ACUTE ONLY): 23 min   Charges:   PT Evaluation $Initial PT Evaluation Tier I: 1 Procedure PT Treatments $Gait Training: 8-22 mins   PT G Codes:        Malayzia Laforte,CYNDI 2015/08/25, 3:50 PM  Magda Kiel, [place] 08/25/15

## 2015-08-09 NOTE — Progress Notes (Addendum)
       [address]       [place] [address]             [phone]               Subjective: Breathing a little better today, less cough and sputum.   Objective: Vital signs in last 24 hours: Patient Vitals for the past 24 hrs:  BP Temp Temp src Pulse Resp SpO2 Height Weight  08/09/15 0354 124/64 mmHg 98.2 F (36.8 C) Oral 97 (!) 23 91 % '5\' 9"'$  (1.753 m) 188 lb 7.9 oz (85.5 kg)  08/08/15 2222 115/87 mmHg 98.7 F (37.1 C) Oral (!) 106 19 98 % - -  08/08/15 2106 - - - (!) 113 (!) 27 97 % - -  08/08/15 1947 121/61 mmHg 98.2 F (36.8 C) Oral 88 (!) 21 96 % - -  08/08/15 1542 (!) 100/53 mmHg - - (!) 56 (!) 26 100 % - -  08/08/15 1146 123/88 mmHg - - 93 (!) 28 100 % - -  08/08/15 1100 - 98 F (36.7 C) Oral - - - - -  08/08/15 0900 - - - - - 100 % - -  08/08/15 0806 113/66 mmHg - - 78 17 100 % - -   Current Weight  08/09/15 188 lb 7.9 oz (85.5 kg)     Intake/Output from previous day: 11/02 0701 - 11/03 0700 In: 4 [IV Piggyback:50] Out: 250 [Urine:250]    PHYSICAL EXAM:  Heart: Irr irr Lungs: Few coarse BS, much clearer overall Wound: Clean and dry Chest tube: No air leak    Lab Results: CBC:No results for input(s): WBC, HGB, HCT, PLT in the last 72 hours. BMET: No results for input(s): NA, K, CL, CO2, GLUCOSE, BUN, CREATININE, CALCIUM in the last 72 hours.  PT/INR: No results for input(s): LABPROT, INR in the last 72 hours.  CXR: FINDINGS: Patchy airspace disease throughout the right lung is stable. Right chest tube remains in place. The side hole is just outside of the thorax. Tiny right basilar lateral pneumothorax is present. There is an air-fluid level in the right upper posterior thorax region which may represent a small loculated apical pneumothorax. Irregular opacities at the left base likely chronic. No left pneumothorax. Upper normal heart size.  IMPRESSION: Stable patchy airspace disease in the right lung.  Stable right  chest tube with a tiny right basilar pneumothorax  There may be a loculated medial right apical pneumothorax with an air-fluid level.  Assessment/Plan: S/P  R CT for spontaneous ptx- CXR stable, CT with no air leak. Plan d/c CT today. Continue medical care per primary, cardiology.    LOS: 5 days    COLLINS,GINA H 08/09/2015   I have seen and examined the patient and agree with the assessment and plan as outlined.  I spent in excess of 15 minutes during the conduct of this hospital encounter and >50% of this time involved direct face-to-face encounter with the patient for counseling and/or coordination of their care.    Rexene Alberts, MD 08/09/2015

## 2015-08-10 ENCOUNTER — Inpatient Hospital Stay (HOSPITAL_COMMUNITY): Payer: Medicare Other

## 2015-08-10 DIAGNOSIS — R609 Edema, unspecified: Secondary | ICD-10-CM

## 2015-08-10 DIAGNOSIS — J449 Chronic obstructive pulmonary disease, unspecified: Secondary | ICD-10-CM | POA: Insufficient documentation

## 2015-08-10 MED ORDER — HEPARIN SODIUM (PORCINE) 5000 UNIT/ML IJ SOLN
5000.0000 [IU] | Freq: Two times a day (BID) | INTRAMUSCULAR | Status: DC
  Administered 2015-08-10 – 2015-08-11 (×3): 5000 [IU] via SUBCUTANEOUS
  Filled 2015-08-10 (×3): qty 1

## 2015-08-10 MED ORDER — CEPHALEXIN 500 MG PO CAPS
500.0000 mg | ORAL_CAPSULE | Freq: Three times a day (TID) | ORAL | Status: DC
  Administered 2015-08-10 – 2015-08-11 (×4): 500 mg via ORAL
  Filled 2015-08-10 (×4): qty 1

## 2015-08-10 NOTE — Progress Notes (Signed)
PT Cancellation Note  Patient Details Name: [name] MRN: [id number] DOB: 1933/07/10   Cancelled Treatment:    Reason Eval/Treat Not Completed: Other (comment); patient refusing to walk at this time stating has already walked the halls today and that hopes to d/c home later if MD will come to d/c.  Will attempt another day if not d/c.   Arbie Reisz,CYNDI 08/10/2015, 3:02 PM  Magda Kiel, [place] 08/10/2015

## 2015-08-10 NOTE — Progress Notes (Addendum)
       [address]       [place] [address]             [phone]               Subjective: Pt going for U/s to R/o DVT.  Feels better since CT d/c'ed, but still sore. Breathing stable.   Objective: Vital signs in last 24 hours: Patient Vitals for the past 24 hrs:  BP Temp Temp src Pulse Resp SpO2 Height Weight  08/10/15 0833 - - - - - 96 % - -  08/10/15 0736 104/66 mmHg - - 83 (!) 22 95 % - -  08/10/15 0700 - 98.3 F (36.8 C) Oral - - - - -  08/10/15 0636 - - - - - - '5\' 9"'$  (1.753 m) 187 lb 6.3 oz (85 kg)  08/10/15 0403 (!) 108/58 mmHg 98.1 F (36.7 C) Oral 85 (!) 26 96 % - -  08/09/15 2311 115/72 mmHg 98.1 F (36.7 C) Oral 100 (!) 21 99 % - -  08/09/15 2028 - - - 77 20 99 % - -  08/09/15 2020 114/68 mmHg 98.8 F (37.1 C) Oral 91 (!) 22 99 % - -  08/09/15 1707 111/62 mmHg - - - - - - -  08/09/15 1705 111/62 mmHg - - 86 (!) 22 97 % - -  08/09/15 1600 - 98.3 F (36.8 C) Oral - - - - -  08/09/15 1539 - - - 91 - (!) 86 % - -  08/09/15 1530 97/64 mmHg - - 77 (!) 25 99 % - -  08/09/15 1200 - 98.2 F (36.8 C) Oral - - - - -  08/09/15 1105 (!) 109/57 mmHg - - - (!) 26 92 % - -   Current Weight  08/10/15 187 lb 6.3 oz (85 kg)     Intake/Output from previous day: 11/03 0701 - 11/04 0700 In: 840 [P.O.:840] Out: 480 [Urine:450; Chest Tube:30]    PHYSICAL EXAM:  Heart: Irr irr Lungs: Scattered rhonchi Wound: Clean and dry    Lab Results: CBC:No results for input(s): WBC, HGB, HCT, PLT in the last 72 hours. BMET: No results for input(s): NA, K, CL, CO2, GLUCOSE, BUN, CREATININE, CALCIUM in the last 72 hours.  PT/INR: No results for input(s): LABPROT, INR in the last 72 hours.  CXR: FINDINGS: Cardiomediastinal silhouette is stable. Persistent small right upper posterior small loculated right hydro pneumothorax. Subcutaneous emphysema right chest wall again noted. Right chest tube has been removed. Small right pleural effusion. Persistent  patchy airspace disease right perihilar and right lower lobe.  IMPRESSION: Persistent small right upper posterior small loculated right hydro pneumothorax. Subcutaneous emphysema right chest wall again noted. Right chest tube has been removed. Small right pleural effusion. Persistent patchy airspace disease right perihilar and right lower lobe.   Assessment/Plan: S/P  R CT for spontaneous ptx- CXR stable, pt doing well following CT removal. Continue current care.    LOS: 6 days    COLLINS,GINA H 08/10/2015  CXR in am Leg [place] negative for DVT  patient   and medical record reviewed,agree with above note. Tharon Aquas Trigt III 08/10/2015

## 2015-08-10 NOTE — Progress Notes (Signed)
Pt transferred from 3s to 6N06; pt a/ax4, V/sw stable. Pt oriented to to the room

## 2015-08-10 NOTE — Progress Notes (Signed)
Name: [name] MRN: [id number] DOB: 1933/02/21    ADMISSION DATE:  08/04/2015  REFERRING MD :  [facility] ED  CHIEF COMPLAINT:  Progressive dyspnea  BRIEF PATIENT DESCRIPTION:    81 year old former smoker (130 pack years) with a history of stage IIIa squamous cell lung cancer status post concurrent chemoradiation, last dose 02/2015. He has been on chronic prednisone for many years. PFT from 10/25 show very severe obstruction, FEV1 0.99L (36% predicted). He has been experiencing progressive dyspnea for several months, more severe over the last 4 weeks.  On 10/25 he was started on spiriva, decreased his chronic pred to '10mg'$  daily. He has worsened in the interim. CXR on ED eval 10/29 showed 25% R pneumothorax. He is on xarelto for Atrial fibrillation .    SIGNIFICANT EVENTS  Spiriva added to Symbicort 10/25 Prednisone decrease from '10mg'$  bid to '10mg'$  qd 10/25  CT chest 10/29 >> moderate / large R PTX without shift, significant emphysema, stable R perihilar mass, B pulmonary nodules  112/16 Awaiting CXR from this am.  NO air leak on CT.  HR stable on PO cardizem.    SUBJECTIVE/OVERNIGHT/INTERVAL HX NAD  VITAL SIGNS: Temp:  [98 F (36.7 C)-98.8 F (37.1 C)] 98 F (36.7 C) (11/04 1100) Pulse Rate:  [77-100] 83 (11/04 0736) Resp:  [20-26] 22 (11/04 0736) BP: (97-115)/(58-72) 104/66 mmHg (11/04 0736) SpO2:  [86 %-99 %] 96 % (11/04 0833) Weight:  [187 lb 6.3 oz (85 kg)] 187 lb 6.3 oz (85 kg) (11/04 0636)  PHYSICAL EXAMINATION:  General:  Elderly man, in bed. NAD.No O2 sleeping lying flat Neuro:  A&O x 3, non focal HEENT:  Op clear, no lesions Cardiovascular:  Tachy, no M Lungs:  Resps even on labored on RA, diminished bases, few exp wheeze, rhonchus. R CT dressing intact  Abdomen: benign Musculoskeletal:  No deformities Skin:  Multiple ecchymoses.   PULMONARY No results for input(s): PHART, PCO2ART, PO2ART, HCO3, TCO2, O2SAT in the last 168 hours.  Invalid input(s): PCO2,  PO2  CBC  Recent Labs Lab 08/04/15 1309 08/05/15 0450 08/06/15 0400  HGB 12.0* 11.7* 10.5*  HCT 39.5 38.9* 34.5*  WBC 14.3* 10.4 12.9*  PLT 177 161 133*    COAGULATION No results for input(s): INR in the last 168 hours.  CARDIAC  No results for input(s): TROPONINI in the last 168 hours. No results for input(s): PROBNP in the last 168 hours.   CHEMISTRY  Recent Labs Lab 08/04/15 1309 08/05/15 0450 08/06/15 0400  NA 136 138 137  K 3.8 3.8 4.7  CL 98* 97* 101  CO2 28 32 29  GLUCOSE 115* 104* 110*  BUN 35* 38* 44*  CREATININE 1.41* 1.23 1.27*  CALCIUM 9.1 9.2 9.0  MG  --   --  2.2   Estimated Creatinine Clearance: 50.1 mL/min (by C-G formula based on Cr of 1.27).   LIVER No results for input(s): AST, ALT, ALKPHOS, BILITOT, PROT, ALBUMIN, INR in the last 168 hours.   INFECTIOUS  Recent Labs Lab 08/07/15 1107 08/08/15 0319 08/09/15 0444  PROCALCITON 0.55 0.45 0.24     ENDOCRINE CBG (last 3)  No results for input(s): GLUCAP in the last 72 hours.     IMAGING x48h   Dg Chest 2 View  08/10/2015  CLINICAL DATA:  Pneumothorax EXAM: CHEST  2 VIEW COMPARISON:  08/09/2015 FINDINGS: Cardiomediastinal silhouette is stable. Persistent small right upper posterior small loculated right hydro pneumothorax. Subcutaneous emphysema right chest wall again noted. Right chest  tube has been removed. Small right pleural effusion. Persistent patchy airspace disease right perihilar and right lower lobe. IMPRESSION: Persistent small right upper posterior small loculated right hydro pneumothorax. Subcutaneous emphysema right chest wall again noted. Right chest tube has been removed. Small right pleural effusion. Persistent patchy airspace disease right perihilar and right lower lobe. Electronically Signed   By: Lahoma Crocker M.D.   On: 08/10/2015 08:04   Dg Chest 2 View  08/09/2015  CLINICAL DATA:  Chest tube EXAM: CHEST  2 VIEW COMPARISON:  Yesterday FINDINGS: Patchy airspace  disease throughout the right lung is stable. Right chest tube remains in place. The side hole is just outside of the thorax. Tiny right basilar lateral pneumothorax is present. There is an air-fluid level in the right upper posterior thorax region which may represent a small loculated apical pneumothorax. Irregular opacities at the left base likely chronic. No left pneumothorax. Upper normal heart size. IMPRESSION: Stable patchy airspace disease in the right lung. Stable right chest tube with a tiny right basilar pneumothorax There may be a loculated medial right apical pneumothorax with an air-fluid level. Electronically Signed   By: Marybelle Killings M.D.   On: 08/09/2015 07:40   Recent Results (from the past 240 hour(s))  MRSA PCR Screening     Status: None   Collection Time: 08/04/15  9:53 PM  Result Value Ref Range Status   MRSA by PCR NEGATIVE NEGATIVE Final    Comment:        The GeneXpert MRSA Assay (FDA approved for NASAL specimens only), is one component of a comprehensive MRSA colonization surveillance program. It is not intended to diagnose MRSA infection nor to guide or monitor treatment for MRSA infections.   Culture, expectorated sputum-assessment     Status: None   Collection Time: 08/09/15  5:33 PM  Result Value Ref Range Status   Specimen Description SPUTUM  Final   Special Requests NONE  Final   Sputum evaluation   Final    MICROSCOPIC FINDINGS SUGGEST THAT THIS SPECIMEN IS NOT REPRESENTATIVE OF LOWER RESPIRATORY SECRETIONS. PLEASE RECOLLECT. NOTIFIED R BROWN 2041 08/09/15 A BROWNING    Report Status 08/09/2015 FINAL  Final       ASSESSMENT / PLAN:  Gold D COPD  Hx squamous cell lung ca - s/p chemo, xrt  R ptx  Probable R radiation pneumonitis  Afib  Chronic steroids  Pain right chest due to chest tube   Plans:  - continue rocephin for now. Day 5/7  - sputum culture poor quality - cont hold xarelto  - continue bronchodilators > symbicort and spiriva -  continue prednisone '20mg'$  daily, his prior chronic dose.  - continue daily lasix - on hold per cards  - get Duplex LE rule out DVT - HR control per cards - on PO cardizem  + dig per cards - continue mucinex-D - chest tube removal per CVTS - on 11/3 with small pneumo on c x r, continue to follow c x r for now - pain control with tylenol prn and ultram prn   Richardson Landry Jenelle Drennon ACNP Maryanna Shape PCCM Pager [phone] till 3 pm If no answer page [phone] 08/10/2015, 12:38 PM

## 2015-08-10 NOTE — Progress Notes (Signed)
*  Preliminary Results* Bilateral lower extremity venous duplex completed. Bilateral lower extremities are negative for deep vein thrombosis. There is no evidence of Baker's cyst bilaterally.  08/10/2015  Maudry Mayhew, RVT, RDCS, RDMS

## 2015-08-11 ENCOUNTER — Inpatient Hospital Stay (HOSPITAL_COMMUNITY): Payer: Medicare Other

## 2015-08-11 ENCOUNTER — Telehealth: Payer: Self-pay | Admitting: Adult Health

## 2015-08-11 LAB — BASIC METABOLIC PANEL
Anion gap: 8 (ref 5–15)
BUN: 26 mg/dL — AB (ref 6–20)
CHLORIDE: 98 mmol/L — AB (ref 101–111)
CO2: 30 mmol/L (ref 22–32)
CREATININE: 1.01 mg/dL (ref 0.61–1.24)
Calcium: 9 mg/dL (ref 8.9–10.3)
GFR calc Af Amer: >60 mL/min (ref >60)
GFR calc non Af Amer: >60 mL/min (ref >60)
GLUCOSE: 81 mg/dL (ref 65–99)
POTASSIUM: 4.1 mmol/L (ref 3.5–5.1)
SODIUM: 136 mmol/L (ref 135–145)

## 2015-08-11 MED ORDER — DIGOXIN 250 MCG PO TABS: ORAL_TABLET | ORAL | Status: AC

## 2015-08-11 MED ORDER — FUROSEMIDE 10 MG/ML IJ SOLN
40.0000 mg | Freq: Once | INTRAMUSCULAR | Status: AC
  Administered 2015-08-11: 40 mg via INTRAVENOUS
  Filled 2015-08-11: qty 4

## 2015-08-11 MED ORDER — DILTIAZEM HCL 90 MG PO TABS: 90.0000 mg | ORAL_TABLET | Freq: Four times a day (QID) | ORAL | Status: AC

## 2015-08-11 MED ORDER — CEPHALEXIN 500 MG PO CAPS: 500.0000 mg | ORAL_CAPSULE | Freq: Three times a day (TID) | ORAL | Status: DC

## 2015-08-11 MED ORDER — TRAMADOL HCL 50 MG PO TABS: 50.0000 mg | ORAL_TABLET | Freq: Four times a day (QID) | ORAL | Status: AC | PRN

## 2015-08-11 MED ORDER — PREDNISONE 20 MG PO TABS: 20.0000 mg | ORAL_TABLET | Freq: Every day | ORAL | Status: AC

## 2015-08-11 NOTE — Progress Notes (Signed)
Pt discharged to home accompy by niece who lives with pt.  Rx for ultram given and explained to pt and family, other meds called into pharmacy.  Pt given 2 copies of DC instructions and they were reviewed.  Follow up appts reviewed and pt/family understands.  Pt has O2 at home as PTA and [facility] was set up for pt by Mariane Masters, CM.  Lasix 40 IV given to pt prior to his discharge.  Several urinals given to pt for home use.

## 2015-08-11 NOTE — Discharge Summary (Signed)
Physician Discharge Summary  Patient ID: [name] MRN: [id number] DOB/AGE: Nov 18, 1932 81 y.o.  Admit date: 08/04/2015 Discharge date: 08/11/2015    Discharge Diagnoses:  Principal Problem:   Chronic respiratory failure ([place]) Active Problems:   Chronic atrial fibrillation (HCC)   COPD   SCC (squamous cell carcinoma of lung) (HCC)   Dyspnea   Radiation pneumonitis (HCC)   Pneumothorax, right   Chest tube in place   History of pneumothorax   COPD (chronic obstructive pulmonary disease) ([place])                                                                   Discharge plan by Discharge Diagnosis  Hx Gold D COPD  Hx squamous cell lung ca - s/p chemo, xrt  R spontaneous ptx - s/p Chest tube 10/29->11/3 Probable R radiation pneumonitis  Acute bronchitis/?CAP D/c plan --  outpt pulm f/u THIS week with CXR and suture removal  Continue spiriva, symbicort, albuterol  Resume prednisone $RemoveBeforeDEI'20mg'LyCZpWKmUFQtzajc$  daily (his previous maintenance dose, attempted wean to $Remov'10mg'VGERPp$ /day just prior to admit) Continuous O2 $RemoveBefor'@3L'lbmHobserImO$   Home health PT  Continue keflex x 3 days for total 7 days   Afib with RVR  HTN  D/c plan --  Continue PO lasix $Remove'40mg'kXWpAXZ$  po daily although suspect he usually takes $RemoveBefor'80mg'LBFSKxOwKIvp$ .  Need to address at oupt cards visit.  F/u with cardiology  Continue diltiazem, digoxin Resume xarelto on d/c  Elevate legs   Brief Summary: [name] is an44 year old former smoker (130 pack years) with a history of COPD, stage IIIa squamous cell lung cancer status post concurrent chemoradiation, last dose 02/2015, AFib on xarelto, HTN, CAD . He has been on chronic prednisone for many years. PFT from 10/25 show very severe obstruction, FEV1 0.99L (36% predicted). He has been experiencing progressive dyspnea for several months, more severe over the last 4 weeks. On 10/25 he was seen as outpt and started on spiriva, decreased his chronic pred to $Remov'10mg'WRtPke$  daily.  Had worsening SOB despite this and presented to ER 10/29.   CXR at that time showed 25% R pneumothorax.  He was admitted and chest tube placed.  It remained in place until 11/3 when it was d/c'd and small residual ptx was monitored.  Course was c/b AFib with RVR which was managed by cardiology as well as CAP, treated with abx.  On 11/5 he has no residual ptx on CXR, feels well and back to baseline respiratory status.  He does have some BLE edema above baseline, but BLE doppler was neg for DVT and pt admits to "frequently doubling up" on his po lasix.  Will give one time additional dose IV and plan d/c home with close outpt monitoring within the next week with CXR and suture removal.    Consults: CVTS  Cardiology   Lines/tubes: R chest tube 10/29>>11/3    Significant Diagnostic Studies:  CT chest 10/29>>> Right pneumothorax, moderate to large in size, increased in size compared to the chest x-ray from earlier same day. No significant mediastinal shift seen. 2. Extensive emphysematous changes bilaterally. 3. Small right pleural effusion. 4. Irregular mass within the right perihilar/suprahilar lung, compatible with patient's known history of lung cancer, stable compared to the most recent chest CT of 06/25/2015  but significantly decreased in size compared to the earlier CT of 11/26/2014. 5. Bilateral pulmonary nodules, suspicious for metastatic disease, but stable compared to multiple prior chest CTs dating back to 11/26/2014. 6. Diffuse coronary artery calcifications. Heart size is normal. 7. Bilateral nephrolithiasis. 11/4 BLE venous dopplers>> NEG DVT     Filed Vitals:   08/10/15 1938 08/10/15 2059 08/11/15 0619 08/11/15 0939  BP: 114/63 111/74 91/50   Pulse: 74 89 86   Temp: 98.4 F (36.9 C) 98.3 F (36.8 C) 97.6 F (36.4 C)   TempSrc: Oral Oral Oral   Resp: $Remo'21 18 18   'jnAaE$ Height:      Weight:      SpO2: 99% 94% 90% 91%     Discharge Labs  BMET  Recent Labs Lab 08/05/15 0450 08/06/15 0400 08/11/15 0629  NA 138 137 136  K  3.8 4.7 4.1  CL 97* 101 98*  CO2 32 29 30  GLUCOSE 104* 110* 81  BUN 38* 44* 26*  CREATININE 1.23 1.27* 1.01  CALCIUM 9.2 9.0 9.0  MG  --  2.2  --      CBC   Recent Labs Lab 08/05/15 0450 08/06/15 0400  HGB 11.7* 10.5*  HCT 38.9* 34.5*  WBC 10.4 12.9*  PLT 161 133*   Anti-Coagulation No results for input(s): INR in the last 168 hours.    Discharge Instructions    Call MD for:  difficulty breathing, headache or visual disturbances    Complete by:  As directed      Call MD for:  persistant dizziness or light-headedness    Complete by:  As directed      Call MD for:  redness, tenderness, or signs of infection (pain, swelling, redness, odor or green/yellow discharge around incision site)    Complete by:  As directed      Call MD for:  severe uncontrolled pain    Complete by:  As directed      Call MD for:  temperature >100.4    Complete by:  As directed      Diet - low sodium heart healthy    Complete by:  As directed      Increase activity slowly    Complete by:  As directed      Leave dressing on - Keep it clean, dry, and intact until clinic visit    Complete by:  As directed                 Follow-up Information    Follow up with BURNETT,BRENT A, MD. Schedule an appointment as soon as possible for a visit in 3 weeks.   Specialty:  Family Medicine   Contact information:   [place] [address] [phone]       Follow up with Collene Gobble., MD.   Specialty:  Pulmonary Disease   Why:  office will call you for appointment THIS week with chest xray    Contact information:   [address] [address] [phone]       Follow up with Ezzard Standing, MD. Schedule an appointment as soon as possible for a visit in 1 week.   Specialty:  Cardiology   Contact information:   [address] [place] [address] [phone]       Follow up with Tharon Aquas Trigt III, MD. Call in 2 weeks.    Specialty:  Cardiothoracic Surgery   Contact  information:   [place] [address] [phone]          Medication List    TAKE these medications        albuterol 108 (90 BASE) MCG/ACT inhaler  Commonly known as:  PROAIR HFA  Inhale 2 puffs into the lungs every 6 (six) hours as needed for wheezing or shortness of breath.     budesonide-formoterol 160-4.5 MCG/ACT inhaler  Commonly known as:  SYMBICORT  Inhale 2 puffs into the lungs 2 (two) times daily.     cephALEXin 500 MG capsule  Commonly known as:  KEFLEX  Take 1 capsule (500 mg total) by mouth every 8 (eight) hours.     dextromethorphan-guaiFENesin 30-600 MG 12hr tablet  Commonly known as:  MUCINEX DM  Take 1 tablet by mouth daily.     digoxin 0.25 MG tablet  Commonly known as:  LANOXIN  1 tablet by mouth daily for heart rate control     diltiazem 90 MG tablet  Commonly known as:  CARDIZEM  Take 1 tablet (90 mg total) by mouth 4 (four) times daily.     furosemide 40 MG tablet  Commonly known as:  LASIX  Take 40 mg by mouth daily.     KLOR-CON M20 20 MEQ tablet  Generic drug:  potassium chloride SA  Take 20 mEq by mouth daily.     predniSONE 20 MG tablet  Commonly known as:  DELTASONE  Take 1 tablet (20 mg total) by mouth daily with breakfast.     rivaroxaban 20 MG Tabs tablet  Commonly known as:  XARELTO  Take 20 mg by mouth daily with supper.     tiotropium 18 MCG inhalation capsule  Commonly known as:  SPIRIVA  Place 1 capsule (18 mcg total) into inhaler and inhale daily.     traMADol 50 MG tablet  Commonly known as:  ULTRAM  Take 1 tablet (50 mg total) by mouth every 6 (six) hours as needed for moderate pain (cough or mild pain).     triamcinolone cream 0.1 %  Commonly known as:  KENALOG  apply as needed     zolpidem 10 MG tablet  Commonly known as:  AMBIEN  Take 10 mg by mouth at bedtime.          Disposition: 06-Home-Health Care Svc  Discharged  Condition: KEAN GAUTREAU has met maximum benefit of inpatient care and is medically stable and cleared for discharge.  Patient is pending follow up as above.      Time spent on disposition:  Greater than 35 minutes.   SignedNickolas Madrid, NP 08/11/2015  3:02 PM Pager: [phone] or [phone]

## 2015-08-11 NOTE — Telephone Encounter (Signed)
Needs OV for hospital f/u THIS week with CXR and suture removal with TP or RB.  Thank you!

## 2015-08-11 NOTE — Care Management Note (Signed)
Case Management Note  Patient Details  Name: [name] MRN: [id number] Date of Birth: 1932-10-29  Subjective/Objective:                   Chronic respiratory failure [facility]) Action/Plan:  Discharge planning Expected Discharge Date:  08/10/14               Expected Discharge Plan:  [place]  In-House Referral:     Discharge planning Services  CM Consult  Post Acute Care Choice:    Choice offered to:  Adult Children  DME Arranged:    DME Agency:     HH Arranged:  PT HH Agency:  [facility]  Status of Service:  Completed, signed off  Medicare Important Message Given:  Yes-second notification given Date Medicare IM Given:    Medicare IM give by:    Date Additional Medicare IM Given:    Additional Medicare Important Message give by:     If discussed at [place] of Stay Meetings, dates discussed:    Additional Comments: Cm spoke with grandaughter to confirm pt's wish to resume [facility] as agency to provide HHPT.  Granddaughter confirms.  Referral called to [facility] rep, Tiffany.  No other CM needs were communicated. Dellie Catholic, RN 08/11/2015, 3:27 PM

## 2015-08-13 NOTE — Telephone Encounter (Signed)
TP can we use one of your held spots, please? Thanks.

## 2015-08-13 NOTE — Telephone Encounter (Signed)
Yes can use on Thursday  I have some in [place]  And can use the later one in [place] on Thursday

## 2015-08-13 NOTE — Telephone Encounter (Signed)
Spoke with pt. He is already scheduled to be seen in [place] office. Nothing further neede

## 2015-08-16 ENCOUNTER — Ambulatory Visit (INDEPENDENT_AMBULATORY_CARE_PROVIDER_SITE_OTHER): Payer: Medicare Other | Admitting: Adult Health

## 2015-08-16 ENCOUNTER — Ambulatory Visit (HOSPITAL_BASED_OUTPATIENT_CLINIC_OR_DEPARTMENT_OTHER)
Admission: RE | Admit: 2015-08-16 | Discharge: 2015-08-16 | Disposition: A | Discharge status: Home / Self Care | Payer: Medicare Other | Source: Ambulatory Visit | Attending: Adult Health | Admitting: Adult Health

## 2015-08-16 ENCOUNTER — Encounter: Payer: Self-pay | Admitting: Adult Health

## 2015-08-16 VITALS — BP 100/65 | HR 76 | Temp 97.5°F | Ht 70.0 in | Wt 174.0 lb

## 2015-08-16 DIAGNOSIS — J439 Emphysema, unspecified: Secondary | ICD-10-CM

## 2015-08-16 DIAGNOSIS — J9611 Chronic respiratory failure with hypoxia: Secondary | ICD-10-CM | POA: Diagnosis not present

## 2015-08-16 DIAGNOSIS — J700 Acute pulmonary manifestations due to radiation: Secondary | ICD-10-CM | POA: Diagnosis not present

## 2015-08-16 DIAGNOSIS — C3491 Malignant neoplasm of unspecified part of right bronchus or lung: Secondary | ICD-10-CM | POA: Insufficient documentation

## 2015-08-16 DIAGNOSIS — R531 Weakness: Secondary | ICD-10-CM | POA: Insufficient documentation

## 2015-08-16 DIAGNOSIS — R0602 Shortness of breath: Secondary | ICD-10-CM | POA: Insufficient documentation

## 2015-08-16 DIAGNOSIS — C3490 Malignant neoplasm of unspecified part of unspecified bronchus or lung: Secondary | ICD-10-CM

## 2015-08-16 DIAGNOSIS — J939 Pneumothorax, unspecified: Secondary | ICD-10-CM

## 2015-08-16 NOTE — Assessment & Plan Note (Signed)
Cont on O2 .  

## 2015-08-16 NOTE — Assessment & Plan Note (Signed)
Sutures removed . Dressing applied.  Wound care discussed .  cxr today

## 2015-08-16 NOTE — Assessment & Plan Note (Signed)
Check cxr today  Slow taper of prednisone   Plan  Continue on  Spiriva one inhalation daily Continue on Prednisone '20mg'$  daily for 2 weeks and then '10mg'$  daily . Hold at this dose until seen back .  Clean area daily , gently . Report any signs of redness or bleeding .  Follow with Dr. Melvyn Novas 1 month and As needed

## 2015-08-16 NOTE — Progress Notes (Signed)
Chart and office note reviewed in detail along with available xrays/ labs > agree with a/p as outlined  

## 2015-08-16 NOTE — Assessment & Plan Note (Signed)
Declines further tx or serial CT follow up .

## 2015-08-16 NOTE — Patient Instructions (Signed)
Please continue your Symbicort 2 puffs twice a day. Remember to rinse and gargle after using Use Oxygen 2l/m  Continue on  Spiriva one inhalation daily Continue on Prednisone '20mg'$  daily for 2 weeks and then '10mg'$  daily . Hold at this dose until seen back .  Clean area daily , gently . Report any signs of redness or bleeding .  Follow with Dr. Melvyn Novas 1 month and As needed

## 2015-08-16 NOTE — Assessment & Plan Note (Signed)
ontinue your Symbicort 2 puffs twice a day. Remember to rinse and gargle after using Use Oxygen 2l/m  Continue on  Spiriva one inhalation daily Continue on Prednisone '20mg'$  daily for 2 weeks and then '10mg'$  daily . Hold at this dose until seen back .  Clean area daily , gently . Report any signs of redness or bleeding .  Follow with Dr. Melvyn Novas 1 month and As needed

## 2015-08-16 NOTE — Progress Notes (Signed)
Subjective:    Patient ID: [name], male    DOB: 03-13-1933, 81 y.o.   MRN: [id number]  HPI 81 year old former smoker (130 pack years) with a history of stage IIIa squamous cell lung cancer status post chemoradiation last dose 02/2015.   TEST  CT scan of the chest was performed 06/25/15 decrease in size of his right upper lobe suprahilar mass and some apparently resolving radiation pneumonitis. PFT  very severe obstruction with an FEV1 of 0.99 L or 36% of predicted, no bronchodilator response, hyperinflated lung volumes, and a decreased diffusion capacity.  08/16/2015 [facility] Follow up : COPD, Chronic Resp Failure on O2 and Lung Cancer (SCC)   Patient presents for a post hospital follow-up.  she was recently admitted October 29 through November 5 for  Acute bronchitis , right spontaneous pneumothorax requiring chest tube, right sided radiation pneumonitis.  Hospital stay was competent by atrial filled with RVR.   Prior to admission. Patient was seen in outpatient clinic and was instructed to decrease his prednisone to 10 mg daily.  On admission patient was found to have a right-sided pneumothorax.  Chest tube was placed.  Patient did have some lower extremity edema, venous Doppler was negative. Patient did improve with diuresis.  Was treated with IV antibiotics, and transitioned to Keflex prior to discharge for presumed bronchitis.  Prednisone was increased to 20 mg daily.  atrial filled improved with Cardizem, digoxin.  He is also on outpatient Xarelto.  chest x-ray prior to discharge showed no pneumothorax.Marland Kitchen He returns today with his family . Says he remains very weak. Gets winded with minimal activity. No energy says he has good appetite . Complains of sore throat and hoarseness for last few days.   Pt has known lung cancer followed by Dr. Julien Nordmann.  Family says he is not going for anymore CT chest as he is not going to do any further tx for his known lung cancer. And they are not  going back to Oncology for follow up .  Says he wishes he had not done chemo or radiation , would have been better off and not in this condition Support provided.   Past Medical History  Diagnosis Date  . Syncope   . COPD (chronic obstructive pulmonary disease) ([place])   . Chronic atrial fibrillation ([place])   . GERD (gastroesophageal reflux disease)   . Coronary artery disease   . Kidney stone   . CAD (coronary artery disease), native coronary artery 12/03/2014  . Thoracic ascending aortic aneurysm ([place]) 12/03/2014    4.2 x 4.3 cm noted on CT scan February 2016   . Radiation 01/04/15-02/16/15    right upper chest/hilar region 60 Gy  . Chronic respiratory failure ([place]) 06/01/2015    - 05/31/2015   Walked 3lpm x one lap @ 185 stopped due to sob with sats still 94% slow pace    rx as of 05/31/2015 = 3lpm 24/7   . COPD 11/25/2014    - Spirometry 09/12/2005  FEV11.05 with ratio 27  - 06/15/2015 p extensive coaching HFA effectiveness =    90%      . Current use of long term anticoagulation   . History of tobacco abuse 11/25/2014  . Hyperthyroidism 06/01/2015  . SCC (squamous cell carcinoma of lung) ([place]) 11/29/2014    Stage IIIA (T3N1M0) - treated with concurrent chemoradiation with weekly carboplatin for AUC of 2 and paclitaxel 45 MG/M2. Last dose 02/12/2015 with partial response  . Radiation pneumonitis ([place])   .  Pneumothorax, right 08/04/2015   Current Outpatient Prescriptions on File Prior to Visit  Medication Sig Dispense Refill  . albuterol (PROAIR HFA) 108 (90 BASE) MCG/ACT inhaler Inhale 2 puffs into the lungs every 6 (six) hours as needed for wheezing or shortness of breath. 1 Inhaler 0  . budesonide-formoterol (SYMBICORT) 160-4.5 MCG/ACT inhaler Inhale 2 puffs into the lungs 2 (two) times daily.    Marland Kitchen dextromethorphan-guaiFENesin (MUCINEX DM) 30-600 MG per 12 hr tablet Take 1 tablet by mouth daily.    . digoxin (LANOXIN) 0.25 MG tablet 1 tablet by mouth daily for heart rate control 30 tablet 0    . diltiazem (CARDIZEM) 90 MG tablet Take 1 tablet (90 mg total) by mouth 4 (four) times daily. 30 tablet 0  . furosemide (LASIX) 40 MG tablet Take 40 mg by mouth daily.    . potassium chloride SA (KLOR-CON M20) 20 MEQ tablet Take 20 mEq by mouth daily.     . predniSONE (DELTASONE) 20 MG tablet Take 1 tablet (20 mg total) by mouth daily with breakfast. 30 tablet 0  . rivaroxaban (XARELTO) 20 MG TABS tablet Take 20 mg by mouth daily with supper.    . tiotropium (SPIRIVA) 18 MCG inhalation capsule Place 1 capsule (18 mcg total) into inhaler and inhale daily. 30 capsule 6  . traMADol (ULTRAM) 50 MG tablet Take 1 tablet (50 mg total) by mouth every 6 (six) hours as needed for moderate pain (cough or mild pain). 20 tablet 0  . triamcinolone cream (KENALOG) 0.1 % apply as needed    . zolpidem (AMBIEN) 10 MG tablet Take 10 mg by mouth at bedtime.      No current facility-administered medications on file prior to visit.    Review of Systems Constitutional:   No  weight loss, night sweats,  Fevers, chills, + fatigue, or  lassitude.  HEENT:   No headaches,  Difficulty swallowing,  Tooth/dental problems, or  Sore throat,                No sneezing, itching, ear ache, nasal congestion, post nasal drip,   CV:  No chest pain,  Orthopnea, PND, swelling in lower extremities, anasarca, dizziness, palpitations, syncope.   GI  No heartburn, indigestion, abdominal pain, nausea, vomiting, diarrhea, change in bowel habits, loss of appetite, bloody stools.   Resp:    No chest wall deformity  Skin: no rash or lesions.  GU: no dysuria, change in color of urine, no urgency or frequency.  No flank pain, no hematuria   MS:  No joint pain or swelling.  No decreased range of motion.  No back pain.  Psych:  No change in mood or affect. No depression or anxiety.  No memory loss.         Objective:   Physical Exam GEN: A/Ox3; pleasant , NAD, obese, chronically ill appearing in wc   HEENT:  /AT,   EACs-clear, TMs-wnl, NOSE-clear, THROAT-clear, no lesions, no postnasal drip or exudate noted.   NECK:  Supple w/ fair ROM; no JVD; normal carotid impulses w/o bruits; no thyromegaly or nodules palpated; no lymphadenopathy.  RESP  Decreased BS in bases , no accessory muscle use, no dullness to percussion,  Right sided sutures intact, no redness, removed intact, site clean and dressing applied.   CARD:  RRR, no m/r/g  , no peripheral edema, pulses intact, no cyanosis or clubbing.  GI:   Soft & nt; nml bowel sounds; no organomegaly or masses detected.  Musco:  Warm bil, no deformities or joint swelling noted.   Neuro: alert, no focal deficits noted.    Skin: Warm, no lesions or rashes        Assessment & Plan:

## 2015-09-06 ENCOUNTER — Inpatient Hospital Stay (HOSPITAL_COMMUNITY)
Admission: EM | Admit: 2015-09-06 | Discharge: 2015-10-07 | DRG: 199 | Disposition: E | Payer: Medicare Other | Attending: Internal Medicine | Admitting: Internal Medicine

## 2015-09-06 ENCOUNTER — Emergency Department (HOSPITAL_COMMUNITY): Payer: Medicare Other

## 2015-09-06 ENCOUNTER — Encounter (HOSPITAL_COMMUNITY): Payer: Self-pay | Admitting: Neurology

## 2015-09-06 ENCOUNTER — Ambulatory Visit: Payer: Medicare Other | Admitting: Emergency Medicine

## 2015-09-06 DIAGNOSIS — R791 Abnormal coagulation profile: Secondary | ICD-10-CM | POA: Diagnosis present

## 2015-09-06 DIAGNOSIS — D649 Anemia, unspecified: Secondary | ICD-10-CM | POA: Diagnosis present

## 2015-09-06 DIAGNOSIS — E873 Alkalosis: Secondary | ICD-10-CM | POA: Diagnosis not present

## 2015-09-06 DIAGNOSIS — J9611 Chronic respiratory failure with hypoxia: Secondary | ICD-10-CM | POA: Diagnosis present

## 2015-09-06 DIAGNOSIS — F17200 Nicotine dependence, unspecified, uncomplicated: Secondary | ICD-10-CM | POA: Diagnosis present

## 2015-09-06 DIAGNOSIS — I959 Hypotension, unspecified: Secondary | ICD-10-CM | POA: Diagnosis present

## 2015-09-06 DIAGNOSIS — I2510 Atherosclerotic heart disease of native coronary artery without angina pectoris: Secondary | ICD-10-CM | POA: Diagnosis present

## 2015-09-06 DIAGNOSIS — J942 Hemothorax: Secondary | ICD-10-CM

## 2015-09-06 DIAGNOSIS — Y95 Nosocomial condition: Secondary | ICD-10-CM | POA: Diagnosis present

## 2015-09-06 DIAGNOSIS — J440 Chronic obstructive pulmonary disease with acute lower respiratory infection: Secondary | ICD-10-CM | POA: Diagnosis present

## 2015-09-06 DIAGNOSIS — Z8709 Personal history of other diseases of the respiratory system: Secondary | ICD-10-CM

## 2015-09-06 DIAGNOSIS — Z66 Do not resuscitate: Secondary | ICD-10-CM | POA: Diagnosis present

## 2015-09-06 DIAGNOSIS — J9383 Other pneumothorax: Principal | ICD-10-CM | POA: Diagnosis present

## 2015-09-06 DIAGNOSIS — J939 Pneumothorax, unspecified: Secondary | ICD-10-CM | POA: Diagnosis not present

## 2015-09-06 DIAGNOSIS — Z720 Tobacco use: Secondary | ICD-10-CM | POA: Insufficient documentation

## 2015-09-06 DIAGNOSIS — N183 Chronic kidney disease, stage 3 (moderate): Secondary | ICD-10-CM | POA: Diagnosis present

## 2015-09-06 DIAGNOSIS — J449 Chronic obstructive pulmonary disease, unspecified: Secondary | ICD-10-CM | POA: Diagnosis present

## 2015-09-06 DIAGNOSIS — Z515 Encounter for palliative care: Secondary | ICD-10-CM | POA: Diagnosis present

## 2015-09-06 DIAGNOSIS — Z9981 Dependence on supplemental oxygen: Secondary | ICD-10-CM | POA: Diagnosis not present

## 2015-09-06 DIAGNOSIS — R079 Chest pain, unspecified: Secondary | ICD-10-CM

## 2015-09-06 DIAGNOSIS — R410 Disorientation, unspecified: Secondary | ICD-10-CM | POA: Diagnosis present

## 2015-09-06 DIAGNOSIS — R0781 Pleurodynia: Secondary | ICD-10-CM | POA: Diagnosis present

## 2015-09-06 DIAGNOSIS — N179 Acute kidney failure, unspecified: Secondary | ICD-10-CM | POA: Diagnosis present

## 2015-09-06 DIAGNOSIS — I482 Chronic atrial fibrillation, unspecified: Secondary | ICD-10-CM | POA: Diagnosis present

## 2015-09-06 DIAGNOSIS — I712 Thoracic aortic aneurysm, without rupture: Secondary | ICD-10-CM | POA: Diagnosis present

## 2015-09-06 DIAGNOSIS — J441 Chronic obstructive pulmonary disease with (acute) exacerbation: Secondary | ICD-10-CM | POA: Diagnosis present

## 2015-09-06 DIAGNOSIS — C3490 Malignant neoplasm of unspecified part of unspecified bronchus or lung: Secondary | ICD-10-CM | POA: Diagnosis present

## 2015-09-06 DIAGNOSIS — K219 Gastro-esophageal reflux disease without esophagitis: Secondary | ICD-10-CM | POA: Diagnosis present

## 2015-09-06 DIAGNOSIS — Z7952 Long term (current) use of systemic steroids: Secondary | ICD-10-CM

## 2015-09-06 DIAGNOSIS — I7121 Aneurysm of the ascending aorta, without rupture: Secondary | ICD-10-CM | POA: Diagnosis present

## 2015-09-06 DIAGNOSIS — D696 Thrombocytopenia, unspecified: Secondary | ICD-10-CM | POA: Diagnosis not present

## 2015-09-06 DIAGNOSIS — R609 Edema, unspecified: Secondary | ICD-10-CM

## 2015-09-06 DIAGNOSIS — Z7901 Long term (current) use of anticoagulants: Secondary | ICD-10-CM | POA: Diagnosis not present

## 2015-09-06 DIAGNOSIS — R042 Hemoptysis: Secondary | ICD-10-CM | POA: Insufficient documentation

## 2015-09-06 DIAGNOSIS — J189 Pneumonia, unspecified organism: Secondary | ICD-10-CM | POA: Diagnosis present

## 2015-09-06 DIAGNOSIS — Z9221 Personal history of antineoplastic chemotherapy: Secondary | ICD-10-CM | POA: Diagnosis not present

## 2015-09-06 DIAGNOSIS — K5900 Constipation, unspecified: Secondary | ICD-10-CM | POA: Diagnosis not present

## 2015-09-06 DIAGNOSIS — T45515A Adverse effect of anticoagulants, initial encounter: Secondary | ICD-10-CM | POA: Diagnosis present

## 2015-09-06 DIAGNOSIS — J9621 Acute and chronic respiratory failure with hypoxia: Secondary | ICD-10-CM | POA: Insufficient documentation

## 2015-09-06 DIAGNOSIS — C7951 Secondary malignant neoplasm of bone: Secondary | ICD-10-CM | POA: Diagnosis present

## 2015-09-06 DIAGNOSIS — M79609 Pain in unspecified limb: Secondary | ICD-10-CM | POA: Diagnosis not present

## 2015-09-06 DIAGNOSIS — Z87891 Personal history of nicotine dependence: Secondary | ICD-10-CM

## 2015-09-06 DIAGNOSIS — R0602 Shortness of breath: Secondary | ICD-10-CM | POA: Diagnosis not present

## 2015-09-06 DIAGNOSIS — R7989 Other specified abnormal findings of blood chemistry: Secondary | ICD-10-CM | POA: Diagnosis present

## 2015-09-06 DIAGNOSIS — M7989 Other specified soft tissue disorders: Secondary | ICD-10-CM | POA: Diagnosis not present

## 2015-09-06 HISTORY — DX: Unspecified macular degeneration: H35.30

## 2015-09-06 HISTORY — DX: Dependence on supplemental oxygen: Z99.81

## 2015-09-06 LAB — CBC WITH DIFFERENTIAL/PLATELET
BASOS ABS: 0 10*3/uL (ref 0.0–0.1)
Basophils Relative: 0 %
Eosinophils Absolute: 0.1 10*3/uL (ref 0.0–0.7)
Eosinophils Relative: 1 %
HEMATOCRIT: 37.4 % — AB (ref 39.0–52.0)
Hemoglobin: 11.4 g/dL — ABNORMAL LOW (ref 13.0–17.0)
LYMPHS ABS: 1 10*3/uL (ref 0.7–4.0)
LYMPHS PCT: 8 %
MCH: 26.3 pg (ref 26.0–34.0)
MCHC: 30.5 g/dL (ref 30.0–36.0)
MCV: 86.2 fL (ref 78.0–100.0)
MONO ABS: 0.7 10*3/uL (ref 0.1–1.0)
MONOS PCT: 6 %
NEUTROS ABS: 10.9 10*3/uL — AB (ref 1.7–7.7)
Neutrophils Relative %: 85 %
Platelets: 151 10*3/uL (ref 150–400)
RBC: 4.34 MIL/uL (ref 4.22–5.81)
RDW: 17.7 % — AB (ref 11.5–15.5)
WBC: 12.7 10*3/uL — ABNORMAL HIGH (ref 4.0–10.5)

## 2015-09-06 LAB — APTT: aPTT: 41 seconds — ABNORMAL HIGH (ref 24–37)

## 2015-09-06 LAB — TROPONIN I
TROPONIN I: 0.05 ng/mL — AB (ref <0.031)
Troponin I: 0.05 ng/mL — ABNORMAL HIGH (ref <0.031)

## 2015-09-06 LAB — BASIC METABOLIC PANEL
Anion gap: 11 (ref 5–15)
BUN: 28 mg/dL — AB (ref 6–20)
CHLORIDE: 98 mmol/L — AB (ref 101–111)
CO2: 26 mmol/L (ref 22–32)
CREATININE: 1.44 mg/dL — AB (ref 0.61–1.24)
Calcium: 9.2 mg/dL (ref 8.9–10.3)
GFR calc non Af Amer: 44 mL/min — ABNORMAL LOW (ref >60)
GFR, EST AFRICAN AMERICAN: 51 mL/min — AB (ref >60)
GLUCOSE: 113 mg/dL — AB (ref 65–99)
Potassium: 3.7 mmol/L (ref 3.5–5.1)
Sodium: 135 mmol/L (ref 135–145)

## 2015-09-06 LAB — I-STAT TROPONIN, ED: Troponin i, poc: 0.1 ng/mL (ref 0.00–0.08)

## 2015-09-06 LAB — TSH: TSH: 0.379 u[IU]/mL (ref 0.350–4.500)

## 2015-09-06 LAB — BRAIN NATRIURETIC PEPTIDE: B Natriuretic Peptide: 135.7 pg/mL — ABNORMAL HIGH (ref 0.0–100.0)

## 2015-09-06 LAB — D-DIMER, QUANTITATIVE (NOT AT ARMC): D DIMER QUANT: 1.92 ug{FEU}/mL — AB (ref 0.00–0.50)

## 2015-09-06 MED ORDER — ALBUTEROL SULFATE (2.5 MG/3ML) 0.083% IN NEBU
3.0000 mL | INHALATION_SOLUTION | Freq: Four times a day (QID) | RESPIRATORY_TRACT | Status: DC | PRN
  Administered 2015-09-08: 3 mL via RESPIRATORY_TRACT
  Filled 2015-09-06: qty 3

## 2015-09-06 MED ORDER — HEPARIN (PORCINE) IN NACL 100-0.45 UNIT/ML-% IJ SOLN
1500.0000 [IU]/h | INTRAMUSCULAR | Status: DC
  Filled 2015-09-06 (×2): qty 250

## 2015-09-06 MED ORDER — HEPARIN (PORCINE) IN NACL 100-0.45 UNIT/ML-% IJ SOLN
1500.0000 [IU]/h | INTRAMUSCULAR | Status: DC
Start: 2015-09-06 — End: 2015-09-06

## 2015-09-06 MED ORDER — PREDNISONE 10 MG PO TABS
10.0000 mg | ORAL_TABLET | Freq: Every day | ORAL | Status: DC
  Administered 2015-09-07 – 2015-09-08 (×2): 10 mg via ORAL
  Filled 2015-09-06 (×3): qty 1

## 2015-09-06 MED ORDER — DILTIAZEM HCL 60 MG PO TABS
90.0000 mg | ORAL_TABLET | Freq: Four times a day (QID) | ORAL | Status: DC
  Administered 2015-09-06 – 2015-09-07 (×3): 90 mg via ORAL
  Filled 2015-09-06 (×3): qty 1

## 2015-09-06 MED ORDER — RIVAROXABAN 20 MG PO TABS: 20.0000 mg | ORAL_TABLET | Freq: Every day | ORAL | Status: DC

## 2015-09-06 MED ORDER — HEPARIN (PORCINE) IN NACL 100-0.45 UNIT/ML-% IJ SOLN
1300.0000 [IU]/h | INTRAMUSCULAR | Status: DC
  Administered 2015-09-06: 1500 [IU]/h via INTRAVENOUS

## 2015-09-06 MED ORDER — ZOLPIDEM TARTRATE 5 MG PO TABS: 10.0000 mg | ORAL_TABLET | Freq: Every evening | ORAL | Status: DC | PRN

## 2015-09-06 MED ORDER — ACETAMINOPHEN 325 MG PO TABS: 650.0000 mg | ORAL_TABLET | ORAL | Status: DC | PRN

## 2015-09-06 MED ORDER — TRAMADOL HCL 50 MG PO TABS
50.0000 mg | ORAL_TABLET | Freq: Four times a day (QID) | ORAL | Status: DC | PRN
  Administered 2015-09-06 – 2015-09-09 (×4): 50 mg via ORAL
  Filled 2015-09-06 (×4): qty 1

## 2015-09-06 MED ORDER — ASPIRIN 81 MG PO CHEW
324.0000 mg | CHEWABLE_TABLET | Freq: Once | ORAL | Status: AC
  Administered 2015-09-06: 324 mg via ORAL
  Filled 2015-09-06: qty 4

## 2015-09-06 MED ORDER — DIGOXIN 125 MCG PO TABS
0.1250 mg | ORAL_TABLET | Freq: Every day | ORAL | Status: DC
  Administered 2015-09-07 – 2015-09-18 (×12): 0.125 mg via ORAL
  Filled 2015-09-06 (×12): qty 1

## 2015-09-06 MED ORDER — ONDANSETRON HCL 4 MG/2ML IJ SOLN
4.0000 mg | Freq: Four times a day (QID) | INTRAMUSCULAR | Status: DC | PRN
  Administered 2015-09-09: 4 mg via INTRAVENOUS
  Filled 2015-09-06: qty 2

## 2015-09-06 MED ORDER — BUDESONIDE-FORMOTEROL FUMARATE 160-4.5 MCG/ACT IN AERO
2.0000 | INHALATION_SPRAY | Freq: Two times a day (BID) | RESPIRATORY_TRACT | Status: DC
  Administered 2015-09-06 – 2015-09-08 (×4): 2 via RESPIRATORY_TRACT
  Filled 2015-09-06: qty 6

## 2015-09-06 MED ORDER — TIOTROPIUM BROMIDE MONOHYDRATE 18 MCG IN CAPS
18.0000 ug | ORAL_CAPSULE | Freq: Every day | RESPIRATORY_TRACT | Status: DC
  Administered 2015-09-07 – 2015-09-08 (×2): 18 ug via RESPIRATORY_TRACT
  Filled 2015-09-06: qty 5

## 2015-09-06 MED ORDER — TRIAMCINOLONE ACETONIDE 0.1 % EX CREA
TOPICAL_CREAM | Freq: Three times a day (TID) | CUTANEOUS | Status: DC
  Administered 2015-09-08 – 2015-09-09 (×4): via TOPICAL
  Administered 2015-09-09 (×2): 1 via TOPICAL
  Administered 2015-09-10: 17:00:00 via TOPICAL
  Administered 2015-09-10 (×2): 1 via TOPICAL
  Administered 2015-09-11 (×2): via TOPICAL
  Administered 2015-09-11: 1 via TOPICAL
  Administered 2015-09-12 – 2015-09-13 (×2): via TOPICAL
  Administered 2015-09-13: 1 via TOPICAL
  Administered 2015-09-14: 11:00:00 via TOPICAL
  Administered 2015-09-14: 1 via TOPICAL
  Administered 2015-09-14 – 2015-09-17 (×7): via TOPICAL
  Administered 2015-09-17: 1 via TOPICAL
  Administered 2015-09-18 (×2): via TOPICAL
  Filled 2015-09-06: qty 15

## 2015-09-06 MED ORDER — ZOLPIDEM TARTRATE 5 MG PO TABS
5.0000 mg | ORAL_TABLET | Freq: Every evening | ORAL | Status: DC | PRN
  Administered 2015-09-06 – 2015-09-07 (×2): 5 mg via ORAL
  Filled 2015-09-06 (×2): qty 1

## 2015-09-06 MED ORDER — SODIUM CHLORIDE 0.9 % IV BOLUS (SEPSIS)
1000.0000 mL | Freq: Once | INTRAVENOUS | Status: AC
  Administered 2015-09-06: 1000 mL via INTRAVENOUS

## 2015-09-06 NOTE — Progress Notes (Addendum)
ANTICOAGULATION CONSULT NOTE - Initial Consult  Pharmacy Consult for heparin Indication: rule out PE/Afib  Allergies  Allergen Reactions  . Codeine Itching    Patient Measurements:   Heparin Dosing Weight: 83.9 kg  Vital Signs: Temp: 97.9 F (36.6 C) (12/01 0920) Temp Source: Oral (12/01 0920) BP: 87/60 mmHg (12/01 0920) Pulse Rate: 60 (12/01 0920)  Labs:  Recent Labs  09/24/2015 0940  HGB 11.4*  HCT 37.4*  PLT 151  CREATININE 1.44*    CrCl cannot be calculated (Unknown ideal weight.).   Medical History: Past Medical History  Diagnosis Date  . Syncope   . COPD (chronic obstructive pulmonary disease) ([place])   . Chronic atrial fibrillation ([place])   . GERD (gastroesophageal reflux disease)   . Coronary artery disease   . Kidney stone   . CAD (coronary artery disease), native coronary artery 12/03/2014  . Thoracic ascending aortic aneurysm ([place]) 12/03/2014    4.2 x 4.3 cm noted on CT scan February 2016   . Radiation 01/04/15-02/16/15    right upper chest/hilar region 60 Gy  . Chronic respiratory failure ([place]) 06/01/2015    - 05/31/2015   Walked 3lpm x one lap @ 185 stopped due to sob with sats still 94% slow pace    rx as of 05/31/2015 = 3lpm 24/7   . COPD 11/25/2014    - Spirometry 09/12/2005  FEV11.05 with ratio 27  - 06/15/2015 p extensive coaching HFA effectiveness =    90%      . Current use of long term anticoagulation   . History of tobacco abuse 11/25/2014  . Hyperthyroidism 06/01/2015  . SCC (squamous cell carcinoma of lung) ([place]) 11/29/2014    Stage IIIA (T3N1M0) - treated with concurrent chemoradiation with weekly carboplatin for AUC of 2 and paclitaxel 45 MG/M2. Last dose 02/12/2015 with partial response  . Radiation pneumonitis ([place])   . Pneumothorax, right 08/04/2015    Medications:   (Not in a hospital admission)  Assessment: 81 yo M with active lung cancer presenting with chest pain and SOB. Pt was taking Xarelto PTA for hx of Afib (last dose 11/30 at  ~2000). Pharmacy consulted to dose heparin for rule out PE.  Hgb 11.4, Plt 151, baseline aPTT = 41, HL > 2.  Goal of Therapy:  Heparin level 0.3-0.7 units/ml aPTT 66-102 seconds Monitor platelets by anticoagulation protocol: Yes   Plan:  - Start heparin infusion at 1500 units/hr - Check anti-Xa level and aPTT in 8 hours and daily while on heparin - Continue to monitor H&H and platelets - F/U long-term AC; Lovenox is the preferred agent for patients with active cancer and would likely be a better option for this patient who has developed a clot on [place], PharmD Clinical Pharmacy Resident Pager: [phone] 09/29/2015,10:34 AM

## 2015-09-06 NOTE — ED Notes (Signed)
Laneta Simmers, MD notified of abnormal lab test results

## 2015-09-06 NOTE — ED Notes (Signed)
Main lab confirms that current blood work can be added on.

## 2015-09-06 NOTE — ED Notes (Signed)
Inpatient RN unavailable for report, will call back.

## 2015-09-06 NOTE — Progress Notes (Signed)
Cardiology Consult Note  Admit date: 09/10/2015 Name: [name] 81 y.o.  male DOB:  1933-01-26 MRN:  [id number]  Today's date:  09/15/2015  Referring Physician:    [facility]  Primary Physician:    Dr. Juanita Craver  Reason for Consultation:   Chest pain   IMPRESSIONS: 1.  Sudden onset of pleuritic and positional chest pain that is severe.  The pain is reproducible when he is supine and is relieved with sitting up suggestive of pericarditis.  In a patient with cancer and bony metastasis could be another cause of this.  The other possibility would be a pulmonary embolus which is less likely due to the positional nature of the pain. 2.  Chronic atrial fibrillation currently rate controlled 3.  Squamous cell lung cancer for which she does not want any additional treatment 4.  Coronary artery disease 5.  Thoracic ascending aortic aneurysm 6.  COPD with respiratory failure  7.  Long-term use of anticoagulation  8.  Stage III chronic kidney disease  RECOMMENDATION: Obtain echocardiogram.  Currently the patient has no pericardial rub and is hemodynamically stable.  Of note his steroids have been tapered recently.  HISTORY: This very nice 81 year old male has squamous cell lung cancer.  He has radiation pneumonitis and had a recent admission for pneumothorax as well as COPD exacerbation.  He has been tapering on his steroids.  He has a history of a thoracic ascending aneurysm that has been stable.  He recently has stated that he does not wish to have further chemotherapy or treatment for squamous cell lung cancer and does not wish follow-up CT scans for further oncology follow-up.  This morning he had the sudden onset of left-sided chest discomfort described as severe and sharp and was brought to the emergency room.  The discomfort lasted around 2 minutes and is fairly well relieved with sitting up or leaning forward and immediately recurs when he lays on his back which are reproduced  on examination today.  He doesn't have angina.  A troponin was minimally elevated that was drawn in the emergency room.  There were no ischemic changes on EKG.  He has not been having this type of chest pain recently.  He does have chronic dyspnea and wears oxygen at home.  He has been on XARELTO for anticoagulation.    Past Medical History  Diagnosis Date  . COPD (chronic obstructive pulmonary disease) ([place])   . Chronic atrial fibrillation ([place])   . GERD (gastroesophageal reflux disease)   . Coronary artery disease   . Kidney stone   . CAD (coronary artery disease), native coronary artery 12/03/2014  . Thoracic ascending aortic aneurysm ([place]) 12/03/2014    4.2 x 4.3 cm noted on CT scan February 2016   . Radiation 01/04/15-02/16/15    right upper chest/hilar region 60 Gy  . Chronic respiratory failure ([place]) 06/01/2015    - 05/31/2015   Walked 3lpm x one lap @ 185 stopped due to sob with sats still 94% slow pace    rx as of 05/31/2015 = 3lpm 24/7   . COPD 11/25/2014    - Spirometry 09/12/2005  FEV11.05 with ratio 27  - 06/15/2015 p extensive coaching HFA effectiveness =    90%      . Current use of long term anticoagulation   . History of tobacco abuse 11/25/2014  . Hyperthyroidism 06/01/2015  . SCC (squamous cell carcinoma of lung) ([place]) 11/29/2014    Stage IIIA (T3N1M0) - treated with  concurrent chemoradiation with weekly carboplatin for AUC of 2 and paclitaxel 45 MG/M2. Last dose 02/12/2015 with partial response  . Radiation pneumonitis ([place])   . Pneumothorax, right 08/04/2015      Past Surgical History  Procedure Laterality Date  . Appendectomy    . Cholecystectomy    . Esophageal dilation  2012  . Video bronchoscopy Bilateral 11/29/2014    Procedure: VIDEO BRONCHOSCOPY WITHOUT FLUORO;  Surgeon: Collene Gobble, MD;  Location: [place];  Service: Cardiopulmonary;  Laterality: Bilateral;  . Esophagogastroduodenoscopy N/A 11/30/2014    Procedure: ESOPHAGOGASTRODUODENOSCOPY (EGD);  Surgeon:  Arta Silence, MD;  Location: [facility] ENDOSCOPY;  Service: Endoscopy;  Laterality: N/A;    Allergies:  is allergic to codeine.   Medications: Prior to Admission medications   Medication Sig Start Date End Date Taking? Authorizing Provider  albuterol (PROAIR HFA) 108 (90 BASE) MCG/ACT inhaler Inhale 2 puffs into the lungs every 6 (six) hours as needed for wheezing or shortness of breath. 05/18/15  Yes Domenic Polite, MD  budesonide-formoterol [facility]) 160-4.5 MCG/ACT inhaler Inhale 2 puffs into the lungs 2 (two) times daily.   Yes Historical Provider, MD  dextromethorphan-guaiFENesin (MUCINEX DM) 30-600 MG per 12 hr tablet Take 1 tablet by mouth daily.   Yes Historical Provider, MD  digoxin (LANOXIN) 0.125 MG tablet Take 0.125 mg by mouth daily.   Yes Historical Provider, MD  diltiazem (CARDIZEM) 90 MG tablet Take 1 tablet (90 mg total) by mouth 4 (four) times daily. 08/11/15  Yes Marijean Heath, NP  furosemide (LASIX) 40 MG tablet Take 40 mg by mouth daily.   Yes Historical Provider, MD  potassium chloride SA (KLOR-CON M20) 20 MEQ tablet Take 20 mEq by mouth daily.    Yes Historical Provider, MD  predniSONE (DELTASONE) 10 MG tablet Take 10 mg by mouth daily with breakfast.   Yes Historical Provider, MD  rivaroxaban (XARELTO) 20 MG TABS tablet Take 20 mg by mouth daily with supper.   Yes Historical Provider, MD  tiotropium (SPIRIVA) 18 MCG inhalation capsule Place 1 capsule (18 mcg total) into inhaler and inhale daily. 07/31/15  Yes Collene Gobble, MD  traMADol (ULTRAM) 50 MG tablet Take 1 tablet (50 mg total) by mouth every 6 (six) hours as needed for moderate pain (cough or mild pain). 08/11/15  Yes Marijean Heath, NP  triamcinolone cream (KENALOG) 0.1 % apply to affected area daily as needed for skin irritation 02/26/15  Yes Historical Provider, MD  zolpidem (AMBIEN) 10 MG tablet Take 10 mg by mouth at bedtime as needed for sleep.    Yes Historical Provider, MD  digoxin (LANOXIN) 0.25  MG tablet 1 tablet by mouth daily for heart rate control Patient not taking: Reported on 09/24/2015 08/11/15   Marijean Heath, NP  predniSONE (DELTASONE) 20 MG tablet Take 1 tablet (20 mg total) by mouth daily with breakfast. Patient not taking: Reported on 09/11/2015 08/11/15   Marijean Heath, NP    Family History: Family Status  Relation Status Death Age  . Father Deceased   . Mother Deceased   . Brother Alive   . Brother Alive   . Sister Alive     CAD    Social History:   reports that he quit smoking about 5 years ago. His smoking use included Cigarettes. He has a 130 pack-year smoking history. He has never used smokeless tobacco. He reports that he does not drink alcohol or use illicit drugs.   Social History  Social History Narrative    Review of Systems: Other than as noted above the remainder of the review of systems is unremarkable.    Physical Exam: BP 121/70 mmHg  Pulse 66  Temp(Src) 97.9 F (36.6 C) (Oral)  Resp 24  Ht '5\' 10"'$  (1.778 m)  Wt 83.915 kg (185 lb)  BMI 26.54 kg/m2  SpO2 100%  General appearance: Elderly pleasant male complaining of chest pain when supine but no chest pain when sitting up Head: Normocephalic, without obvious abnormality, atraumatic Eyes: negative Neck: no adenopathy, no carotid bruit, no JVD and supple, symmetrical, trachea midline Lungs: Mildly reduced breath sounds at the right base, clear on the left Heart: Irregular rhythm, no murmur was heard no rub was heard, normal S1 and S2, no S3 Abdomen: soft, non-tender; bowel sounds normal; no masses,  no organomegaly Rectal: deferred Extremities: Changes of chronic venous stasis are noted.  Normal range of motion and strength, 2+ edema of the right leg, chronic changes in the left leg but no edema noted Pulses: 2+ and symmetric Skin: Brawny discoloration of both forearms Neurologic: Grossly normal  Labs: CBC  Recent Labs  09/17/2015 0940  WBC 12.7*  RBC 4.34  HGB  11.4*  HCT 37.4*  PLT 151  MCV 86.2  MCH 26.3  MCHC 30.5  RDW 17.7*  LYMPHSABS 1.0  MONOABS 0.7  EOSABS 0.1  BASOSABS 0.0   CMP   Recent Labs  09/25/2015 0940  NA 135  K 3.7  CL 98*  CO2 26  GLUCOSE 113*  BUN 28*  CREATININE 1.44*  CALCIUM 9.2  GFRNONAA 44*  GFRAA 51*   BNP (last 3 results)    Component Value Date/Time   BNP 135.7* 09/12/2015 0940   Troponin (Point of Care Test)  Recent Labs  10/01/2015 0946  TROPIPOC 0.10*    Radiology: Chronic changes in the right lung due to either scarring or tumor, left lung is clear, no pneumothorax  EKG: Atrial fibrillation with controlled ventricular response, no significant ST elevation consistent with pericarditis  Signed:  W. Doristine Church MD [facility]   Cardiology Consultant  09/16/2015, 12:59 PM

## 2015-09-06 NOTE — ED Notes (Signed)
Pt is lung cancer pt, around 0800 developed left sided cp and worsening SOB. At current no CP but is sob. Wears 3 L oxygen prn. Pt is a x 4.

## 2015-09-06 NOTE — ED Provider Notes (Signed)
CSN: [id number]     Arrival date & time 09/20/2015  0905 History   First MD Initiated Contact with Patient 09/13/2015 [phone]     Chief Complaint  Patient presents with  . Shortness of Breath  . Chest Pain     (Consider location/radiation/quality/duration/timing/severity/associated sxs/prior Treatment) Patient is a 81 y.o. male presenting with chest pain. The history is provided by the patient.  Chest Pain Pain location:  L chest Pain quality: sharp   Pain radiates to:  Does not radiate Pain radiates to the back: no   Pain severity:  Moderate Onset quality:  Sudden Timing:  Constant Progression:  Unchanged Chronicity:  New Context: breathing and at rest   Relieved by:  Nothing Worsened by:  Certain positions Ineffective treatments:  None tried Associated symptoms: lower extremity edema (bilateral R>L) and shortness of breath (worse than usual)   Associated symptoms: no abdominal pain, no diaphoresis, no fever, no nausea and not vomiting   Risk factors: aortic disease (previous proximal aneurysm) and coronary artery disease     Past Medical History  Diagnosis Date  . Syncope   . COPD (chronic obstructive pulmonary disease) ([place])   . Chronic atrial fibrillation ([place])   . GERD (gastroesophageal reflux disease)   . Coronary artery disease   . Kidney stone   . CAD (coronary artery disease), native coronary artery 12/03/2014  . Thoracic ascending aortic aneurysm ([place]) 12/03/2014    4.2 x 4.3 cm noted on CT scan February 2016   . Radiation 01/04/15-02/16/15    right upper chest/hilar region 60 Gy  . Chronic respiratory failure ([place]) 06/01/2015    - 05/31/2015   Walked 3lpm x one lap @ 185 stopped due to sob with sats still 94% slow pace    rx as of 05/31/2015 = 3lpm 24/7   . COPD 11/25/2014    - Spirometry 09/12/2005  FEV11.05 with ratio 27  - 06/15/2015 p extensive coaching HFA effectiveness =    90%      . Current use of long term anticoagulation   . History of tobacco abuse 11/25/2014  .  Hyperthyroidism 06/01/2015  . SCC (squamous cell carcinoma of lung) ([place]) 11/29/2014    Stage IIIA (T3N1M0) - treated with concurrent chemoradiation with weekly carboplatin for AUC of 2 and paclitaxel 45 MG/M2. Last dose 02/12/2015 with partial response  . Radiation pneumonitis ([place])   . Pneumothorax, right 08/04/2015   Past Surgical History  Procedure Laterality Date  . Appendectomy    . Cholecystectomy    . Esophageal dilation  2012  . Video bronchoscopy Bilateral 11/29/2014    Procedure: VIDEO BRONCHOSCOPY WITHOUT FLUORO;  Surgeon: Collene Gobble, MD;  Location: [place];  Service: Cardiopulmonary;  Laterality: Bilateral;  . Esophagogastroduodenoscopy N/A 11/30/2014    Procedure: ESOPHAGOGASTRODUODENOSCOPY (EGD);  Surgeon: Arta Silence, MD;  Location: [facility] ENDOSCOPY;  Service: Endoscopy;  Laterality: N/A;   Family History  Problem Relation Age of Onset  . Stroke    . Cancer    . Heart attack Sister   . Lung cancer Daughter     smokes   Social History  Substance Use Topics  . Smoking status: Former Smoker -- 2.00 packs/day for 65 years    Types: Cigarettes    Quit date: 12/12/2009  . Smokeless tobacco: Never Used  . Alcohol Use: No     Comment: has not drank in 4-5 years.  Has a beer every once in awhile.    Review of Systems  Constitutional:  Negative for fever and diaphoresis.  Respiratory: Positive for shortness of breath (worse than usual).   Cardiovascular: Positive for chest pain.  Gastrointestinal: Negative for nausea, vomiting and abdominal pain.  All other systems reviewed and are negative.     Allergies  Codeine  Home Medications   Prior to Admission medications   Medication Sig Start Date End Date Taking? Authorizing Provider  albuterol (PROAIR HFA) 108 (90 BASE) MCG/ACT inhaler Inhale 2 puffs into the lungs every 6 (six) hours as needed for wheezing or shortness of breath. 05/18/15   Domenic Polite, MD  budesonide-formoterol (SYMBICORT) 160-4.5 MCG/ACT  inhaler Inhale 2 puffs into the lungs 2 (two) times daily.    Historical Provider, MD  dextromethorphan-guaiFENesin (MUCINEX DM) 30-600 MG per 12 hr tablet Take 1 tablet by mouth daily.    Historical Provider, MD  digoxin (LANOXIN) 0.25 MG tablet 1 tablet by mouth daily for heart rate control 08/11/15   Marijean Heath, NP  diltiazem (CARDIZEM) 90 MG tablet Take 1 tablet (90 mg total) by mouth 4 (four) times daily. 08/11/15   Marijean Heath, NP  furosemide (LASIX) 40 MG tablet Take 40 mg by mouth daily.    Historical Provider, MD  potassium chloride SA (KLOR-CON M20) 20 MEQ tablet Take 20 mEq by mouth daily.     Historical Provider, MD  predniSONE (DELTASONE) 20 MG tablet Take 1 tablet (20 mg total) by mouth daily with breakfast. 08/11/15   Marijean Heath, NP  rivaroxaban (XARELTO) 20 MG TABS tablet Take 20 mg by mouth daily with supper.    Historical Provider, MD  tiotropium (SPIRIVA) 18 MCG inhalation capsule Place 1 capsule (18 mcg total) into inhaler and inhale daily. 07/31/15   Collene Gobble, MD  traMADol (ULTRAM) 50 MG tablet Take 1 tablet (50 mg total) by mouth every 6 (six) hours as needed for moderate pain (cough or mild pain). 08/11/15   Marijean Heath, NP  triamcinolone cream (KENALOG) 0.1 % apply as needed 02/26/15   Historical Provider, MD  zolpidem (AMBIEN) 10 MG tablet Take 10 mg by mouth at bedtime.     Historical Provider, MD   BP 87/60 mmHg  Pulse 60  Temp(Src) 97.9 F (36.6 C) (Oral)  Resp 22  SpO2 98% Physical Exam  Constitutional: He is oriented to person, place, and time. He appears well-developed and well-nourished. No distress.  HENT:  Head: Normocephalic and atraumatic.  Eyes: Conjunctivae are normal.  Neck: Neck supple. No tracheal deviation present.  Cardiovascular: Normal rate, regular rhythm and normal heart sounds.  Exam reveals no friction rub.   No murmur heard. Pulmonary/Chest: Effort normal. No respiratory distress. He exhibits no  tenderness.  Abdominal: Soft. He exhibits no distension. There is no tenderness.  Neurological: He is alert and oriented to person, place, and time.  Skin: Skin is warm and dry.  Psychiatric: He has a normal mood and affect.    ED Course  Procedures (including critical care time)  CRITICAL CARE Performed by: Leo Grosser Total critical care time: 30 minutes Critical care time was exclusive of separately billable procedures and treating other patients. Critical care was necessary to treat or prevent imminent or life-threatening deterioration. Critical care was time spent personally by me on the following activities: development of treatment plan with patient and/or surrogate as well as nursing, discussions with consultants, evaluation of patient's response to treatment, examination of patient, obtaining history from patient or surrogate, ordering and performing treatments and interventions, ordering and  review of laboratory studies, ordering and review of radiographic studies, pulse oximetry and re-evaluation of patient's condition.   Emergency Focused Ultrasound Exam Limited Ultrasound of the Heart and Pericardium  Performed and interpreted by Dr. Laneta Simmers Indication: dyspnea Multiple views of the heart, pericardium, and IVC are obtained with a multi frequency probe.  Findings: nml contractility, no anechoic fluid, variable IVC collapse Interpretation: nml ejection fraction, no pericardial effusion, no depressed CVP, study limited with poor transthoracic windows Images archived electronically.  CPT Code: [id number]  Emergency Focused Ultrasound Exam Limited Thorax   Performed and interpreted by Dr Laneta Simmers Longitudinal view of anterior left and right lung fields in real-time with linear probe. Indication: dyspnea Findings: + lung sliding and cardiac motion artifact on  Left, occasional B lines Interpretation: no evidence of pneumothorax. Images electronically archived.   CPT code:  [id number]  Emergency Focused Ultrasound Exam Limited Ultrasound of Lower Extremity for DVT  Performed and interpreted by Dr. Laneta Simmers Indication: Leg swelling Transverse views of right lower extremity are obtained in real time for the purposes of evaluation for deep venous thrombosis.  Findings: + collapsible proximal femoral vein, + collapsible popliteal vein Interpretation: no deep venous thrombosis Images archived electronically.  CPT Code:   09811   Labs Review Labs Reviewed  BASIC METABOLIC PANEL - Abnormal; Notable for the following:    Chloride 98 (*)    Glucose, Bld 113 (*)    BUN 28 (*)    Creatinine, Ser 1.44 (*)    GFR calc non Af Amer 44 (*)    GFR calc Af Amer 51 (*)    All other components within normal limits  CBC WITH DIFFERENTIAL/PLATELET - Abnormal; Notable for the following:    WBC 12.7 (*)    Hemoglobin 11.4 (*)    HCT 37.4 (*)    RDW 17.7 (*)    Neutro Abs 10.9 (*)    All other components within normal limits  BRAIN NATRIURETIC PEPTIDE - Abnormal; Notable for the following:    B Natriuretic Peptide 135.7 (*)    All other components within normal limits  APTT - Abnormal; Notable for the following:    aPTT 41 (*)    All other components within normal limits  D-DIMER, QUANTITATIVE (NOT AT [facility]) - Abnormal; Notable for the following:    D-Dimer, Quant 1.92 (*)    All other components within normal limits  HEPARIN LEVEL (UNFRACTIONATED) - Abnormal; Notable for the following:    Heparin Unfractionated >2.00 (*)    All other components within normal limits  TROPONIN I - Abnormal; Notable for the following:    Troponin I 0.05 (*)    All other components within normal limits  I-STAT TROPOININ, ED - Abnormal; Notable for the following:    Troponin i, poc 0.10 (*)    All other components within normal limits  TSH  TROPONIN I  TROPONIN I  APTT  CBC    Imaging Review Dg Chest 2 View  09/30/2015  CLINICAL DATA:  History of lung cancer.  History  pneumothorax EXAM: CHEST  2 VIEW COMPARISON:  08/16/2015 FINDINGS: COPD. Streaky markings throughout the right lung are unchanged. This may be due to scarring and/or tumor. No significant pneumothorax.  No pleural effusion. Left lung is clear.  Negative for heart failure IMPRESSION: Extensive chronic changes in the right lung due to scarring and/or tumor. No pneumothorax. Electronically Signed   By: Franchot Gallo M.D.   On: 09/08/2015 10:14   I have personally  reviewed and evaluated these images and lab results as part of my medical decision-making.   EKG Interpretation   Date/Time:  Thursday September 06 2015 09:17:56 EST Ventricular Rate:  70 PR Interval:    QRS Duration: 90 QT Interval:  416 QTC Calculation: 449 R Axis:   26 Text Interpretation:  Atrial flutter with variable A-V block Abnormal ECG  Since last tracing rate slower Otherwise no significant change Confirmed  by Roxanna Mcever MD, Quillian Quince ([id number]) on 09/29/2015 9:27:45 AM      MDM   Final diagnoses:  Pleuritic chest pain   81 y.o. male presents with sudden onset left sided pleuritic chest pain made worse with position and inspiration this morning while at rest. On xarelto and rate controlling meds for afib. High risk for PE given clinical features and history of cancer with previous radiation and chemo. Had right worse than left leg swelling but previous dopplers negative and no evidence of DVT today. Has h/o pneumothorax but none noted today. Bedside echo is limited d/t poor windows so unable to assess for RH strain. Atypical pain for dissection but Pt does have known aneurysm. Pt with elevated BNP and troponin, doubt NSTEMI without typical symptoms or acute EKG changes, started empirically on heparin gtt for PE. Possible pericarditis with positional symptoms but no EKG findings and sudden onset is more concerning for PE. Unable to perform CT d/t GFR 44 and single kidney. Will require VQ scanning or hydration and contrasted study as  inpatient. Will also need formal echo. Hospitalist was consulted for admission and will see the patient in the emergency department.     Leo Grosser, MD 09/12/2015 [phone]

## 2015-09-06 NOTE — H&P (Signed)
[facility] History and Physical                                                                                    [name], is a 81 y.o. male  MRN: [id number]   DOB - July 30, 1933  Admit Date - 10/03/2015  Outpatient Primary MD for the patient is [name], MD  Referring MD: Caroleen Hamman  Consulting M.D: [place]/ Cardiology  With History of -  Past Medical History  Diagnosis Date  . Syncope   . COPD (chronic obstructive pulmonary disease) ([place])   . Chronic atrial fibrillation ([place])   . GERD (gastroesophageal reflux disease)   . Coronary artery disease   . Kidney stone   . CAD (coronary artery disease), native coronary artery 12/03/2014  . Thoracic ascending aortic aneurysm ([place]) 12/03/2014    4.2 x 4.3 cm noted on CT scan February 2016   . Radiation 01/04/15-02/16/15    right upper chest/hilar region 60 Gy  . Chronic respiratory failure ([place]) 06/01/2015    - 05/31/2015   Walked 3lpm x one lap @ 185 stopped due to sob with sats still 94% slow pace    rx as of 05/31/2015 = 3lpm 24/7   . COPD 11/25/2014    - Spirometry 09/12/2005  FEV11.05 with ratio 27  - 06/15/2015 p extensive coaching HFA effectiveness =    90%      . Current use of long term anticoagulation   . History of tobacco abuse 11/25/2014  . Hyperthyroidism 06/01/2015  . SCC (squamous cell carcinoma of lung) ([place]) 11/29/2014    Stage IIIA (T3N1M0) - treated with concurrent chemoradiation with weekly carboplatin for AUC of 2 and paclitaxel 45 MG/M2. Last dose 02/12/2015 with partial response  . Radiation pneumonitis ([place])   . Pneumothorax, right 08/04/2015      Past Surgical History  Procedure Laterality Date  . Appendectomy    . Cholecystectomy    . Esophageal dilation  2012  . Video bronchoscopy Bilateral 11/29/2014    Procedure: VIDEO BRONCHOSCOPY WITHOUT FLUORO;  Surgeon: Collene Gobble, MD;  Location: [place];  Service: Cardiopulmonary;  Laterality: Bilateral;  . Esophagogastroduodenoscopy N/A  11/30/2014    Procedure: ESOPHAGOGASTRODUODENOSCOPY (EGD);  Surgeon: Arta Silence, MD;  Location: [facility] END[facility]COPY;  Service: Endoscopy;  Laterality: N/A;    in for   Chief Complaint  Patient presents with  . Shortness of Breath  . Chest Pain     HPI This is an 81 year old male patient known chronic hypoxemic respiratory failure secondary to COPD on 3 L oxygen, no non-small cell lung cancer who completed chemotherapy and radiation in May 2016, Recent admission for spontaneous pneumothorax on 11/5, atrial fibrillation on several toe, known thoracic ascending aortic aneurysm measuring 4.3 cm as of febrile 2016 CT, abnormal TSH and August 2016, history of radiation pneumonitis, known CAD and GERD who presents to the ER with abrupt onset of left sided anterior pleuritic-type chest pain. Patient reports that he was in his usual state of health until he developed abrupt onset of chest pain at 8 AM. This was not associated with shortness of breath. He has not had any cough  fevers or chills. He was recently in the hospital and was discharged on 11/5 after being treated for spontaneous pneumothorax. He reports that after arriving home he had a episode where he fell in the shower and is extensive bruising to the back. He reports after discharge home his Lanoxin dose needed to be clarified. Patient recently had significant lower extremity edema during the last admission which family reports has improved significantly. On 11/4 patient underwent lower extremity venous duplex without any evidence of lower extremity DVT found  In the ER agents initial seated blood pressure was 87/60, he was in atrial fibrillation, pulse was 60 and irregular, respirations were 22 and O2 sat urinations were 98% on 3 L. Chest x-ray showed extensive chronic changes in the right lung due to scarring. Last CT of the chest had been completed on October 29 at which time the right pneumothorax was discovered. He had stable bilateral pulmonary  nodules. EKG revealed atrial flutter with ventricular rate of bees per minute, QTC 449 ms, no definitive ischemic changes. BMP 135, troponin slightly elevated at 0.10, BUN 28 and creatinine 1.44. Patient reports that prednisone had recently been decreased from 20 mg to 10. Patient had mild leukocytosis white count 12,700 with mildly elevated absolute neutrophil count of 10.9%. A d-dimer has been checked in the slightly elevated at 1.92. Despite patient being on Xarelto ER physician was concerned he may have had breakthrough coagulopathy in setting of malignancy and with elevated d-dimer opted to put the patient on full dose IV heparin. EDP performed bedside quick look lower extremity duplex without any evidence of DVT noted. On physical exam it was noted that the patient's chest pain was consistent with pleuritic nature and improved with patient leaning forward.   Review of Systems   In addition to the HPI above,  No Fever-chills, myalgias or other constitutional symptoms No Headache, changes with Vision or hearing, new weakness, tingling, numbness in any extremity, No problems swallowing food or Liquids, indigestion/reflux No Cough or Shortness of Breath, palpitations, orthopnea or DOE No Abdominal pain, N/V; no melena or hematochezia, no dark tarry stools, Bowel movements are regular, No dysuria, hematuria or flank pain No new skin rashes, lesions, masses or bruises, No new joints pains-aches No recent weight gain or loss No polyuria, polydypsia or polyphagia,  *A full 10 point Review of Systems was done, except as stated above, all other Review of Systems were negative.  Social History Social History  Substance Use Topics  . Smoking status: Former Smoker -- 2.00 packs/day for 65 years    Types: Cigarettes    Quit date: 12/12/2009  . Smokeless tobacco: Never Used  . Alcohol Use: No     Comment: has not drank in 4-5 years.  Has a beer every once in awhile.    Resides at: Private  residence  Lives with: Alone  Ambulatory status: Without assistive devices   Family History Family History  Problem Relation Age of Onset  . Stroke    . Cancer    . Heart attack Sister   . Lung cancer Daughter     smokes     Prior to Admission medications   Medication Sig Start Date End Date Taking? Authorizing Provider  albuterol (PROAIR HFA) 108 (90 BASE) MCG/ACT inhaler Inhale 2 puffs into the lungs every 6 (six) hours as needed for wheezing or shortness of breath. 05/18/15  Yes Domenic Polite, MD  budesonide-formoterol [facility]) 160-4.5 MCG/ACT inhaler Inhale 2 puffs into the lungs 2 (two)  times daily.   Yes Historical Provider, MD  dextromethorphan-guaiFENesin (MUCINEX DM) 30-600 MG per 12 hr tablet Take 1 tablet by mouth daily.   Yes Historical Provider, MD  digoxin (LANOXIN) 0.25 MG tablet 1 tablet by mouth daily for heart rate control 08/11/15  Yes Marijean Heath, NP  diltiazem (CARDIZEM) 90 MG tablet Take 1 tablet (90 mg total) by mouth 4 (four) times daily. 08/11/15  Yes Marijean Heath, NP  furosemide (LASIX) 40 MG tablet Take 40 mg by mouth daily.   Yes Historical Provider, MD  potassium chloride SA (KLOR-CON M20) 20 MEQ tablet Take 20 mEq by mouth daily.    Yes Historical Provider, MD  predniSONE (DELTASONE) 10 MG tablet Take 10 mg by mouth daily with breakfast.   Yes Historical Provider, MD  rivaroxaban (XARELTO) 20 MG TABS tablet Take 20 mg by mouth daily with supper.   Yes Historical Provider, MD  tiotropium (SPIRIVA) 18 MCG inhalation capsule Place 1 capsule (18 mcg total) into inhaler and inhale daily. 07/31/15  Yes Collene Gobble, MD  traMADol (ULTRAM) 50 MG tablet Take 1 tablet (50 mg total) by mouth every 6 (six) hours as needed for moderate pain (cough or mild pain). 08/11/15  Yes Marijean Heath, NP  triamcinolone cream (KENALOG) 0.1 % apply to affected area daily as needed for skin irritation 02/26/15  Yes Historical Provider, MD  zolpidem (AMBIEN)  10 MG tablet Take 10 mg by mouth at bedtime as needed for sleep.    Yes Historical Provider, MD  predniSONE (DELTASONE) 20 MG tablet Take 1 tablet (20 mg total) by mouth daily with breakfast. Patient not taking: Reported on 09/27/2015 08/11/15   Marijean Heath, NP    Allergies  Allergen Reactions  . Codeine Itching    Physical Exam  Vitals  Blood pressure 121/70, pulse 66, temperature 97.9 F (36.6 C), temperature source Oral, resp. rate 24, height '5\' 10"'$  (1.778 m), weight 185 lb (83.915 kg), SpO2 100 %.   General:  In no acute distress, appears stated age but chronically debilitated  Psych:  Normal affect, Denies Suicidal or Homicidal ideations, Awake Alert, Oriented X 3. Speech and thought patterns are clear and appropriate, no apparent short term memory deficits  Neuro:   No focal neurological deficits, CN II through XII intact, Strength 5/5 all 4 extremities, Sensation intact all 4 extremities.  ENT:  Ears and Eyes appear Normal, Conjunctivae clear, PER. Moist oral mucosa without erythema or exudates.  Neck:  Supple, No lymphadenopathy appreciated  Respiratory:  Symmetrical chest wall movement, Good air movement bilaterally, coarse to auscultation with inspiratory rub mid chest. 3 L  Cardiac: Regular with underlying atrial flutter, No Murmurs, chronic right greater than left LE edema noted, no JVD, No carotid bruits, peripheral pulses palpable at 2+  Abdomen:  Positive bowel sounds, Soft, Non tender, Non distended,  No masses appreciated, no obvious hepatosplenomegaly  Skin:  No Cyanosis, Normal Skin Turgor, No Skin Rash or Bruise.Has significant bruising on upper back consistent with previously reported fall  Extremities: Symmetrical without obvious trauma or injury,  no effusions.  Data Review  CBC  Recent Labs Lab 09/22/2015 0940  WBC 12.7*  HGB 11.4*  HCT 37.4*  PLT 151  MCV 86.2  MCH 26.3  MCHC 30.5  RDW 17.7*  LYMPHSABS 1.0  MONOABS 0.7  EOSABS 0.1    BASOSABS 0.0    Chemistries   Recent Labs Lab 09/14/2015 0940  NA 135  K 3.7  CL 98*  CO2 26  GLUCOSE 113*  BUN 28*  CREATININE 1.44*  CALCIUM 9.2    estimated creatinine clearance is 42.2 mL/min (by C-G formula based on Cr of 1.44).  No results for input(s): TSH, T4TOTAL, T3FREE, THYROIDAB in the last 72 hours.  Invalid input(s): FREET3  Coagulation profile No results for input(s): INR, PROTIME in the last 168 hours.   Recent Labs  09/27/2015 0940  DDIMER 1.92*    Cardiac Enzymes No results for input(s): CKMB, TROPONINI, MYOGLOBIN in the last 168 hours.  Invalid input(s): CK  Invalid input(s): POCBNP  Urinalysis    Component Value Date/Time   COLORURINE YELLOW 02/13/2010 [place] 02/13/2010 1656   LABSPEC 1.010 02/13/2010 1656   PHURINE 6.5 02/13/2010 1656   GLUCOSEU NEGATIVE 02/13/2010 1656   HGBUR MODERATE* 02/13/2010 1656   [place] 02/13/2010 1656   [place] 02/13/2010 1656   PROTEINUR 30* 02/13/2010 1656   UROBILINOGEN 0.2 02/13/2010 1656   NITRITE NEGATIVE 02/13/2010 1656   LEUKOCYTESUR NEGATIVE 02/13/2010 1656    Imaging results:   Dg Chest 2 View  09/15/2015  CLINICAL DATA:  History of lung cancer.  History pneumothorax EXAM: CHEST  2 VIEW COMPARISON:  08/16/2015 FINDINGS: COPD. Streaky markings throughout the right lung are unchanged. This may be due to scarring and/or tumor. No significant pneumothorax.  No pleural effusion. Left lung is clear.  Negative for heart failure IMPRESSION: Extensive chronic changes in the right lung due to scarring and/or tumor. No pneumothorax. Electronically Signed   By: Franchot Gallo M.D.   On: 09/24/2015 10:14   Dg Chest 2 View  08/16/2015  CLINICAL DATA:  Patient weakness.  Shortness of breath. EXAM: CHEST  2 VIEW COMPARISON:  08/11/2015, 05/15/2015. CT chest 08/04/2015. PET-CT 12/22/2014 . FINDINGS: Mediastinum and hilar structures are stable. Stable cardiomegaly. Post  treatment changes noted about the right lung in this patient with known prior lung malignancy. Prominent interstitial prominence noted throughout the right lung. No pneumothorax no on today's examination. Resolving right chest wall subcutaneous emphysema. IMPRESSION: 1. Stable chest with stable post treatment changes in the right lung in this patient with known prior lung malignancy. Stable interstitial changes are present. 2. No pneumothorax noted on today's examination. Resolving right chest wall subcutaneous emphysema noted. Electronically Signed   By: Marcello Moores  Register   On: 08/16/2015 12:41   Dg Chest 2 View  08/10/2015  CLINICAL DATA:  Pneumothorax EXAM: CHEST  2 VIEW COMPARISON:  08/09/2015 FINDINGS: Cardiomediastinal silhouette is stable. Persistent small right upper posterior small loculated right hydro pneumothorax. Subcutaneous emphysema right chest wall again noted. Right chest tube has been removed. Small right pleural effusion. Persistent patchy airspace disease right perihilar and right lower lobe. IMPRESSION: Persistent small right upper posterior small loculated right hydro pneumothorax. Subcutaneous emphysema right chest wall again noted. Right chest tube has been removed. Small right pleural effusion. Persistent patchy airspace disease right perihilar and right lower lobe. Electronically Signed   By: Lahoma Crocker M.D.   On: 08/10/2015 08:04   Dg Chest 2 View  08/09/2015  CLINICAL DATA:  Chest tube EXAM: CHEST  2 VIEW COMPARISON:  Yesterday FINDINGS: Patchy airspace disease throughout the right lung is stable. Right chest tube remains in place. The side hole is just outside of the thorax. Tiny right basilar lateral pneumothorax is present. There is an air-fluid level in the right upper posterior thorax region which may represent a small loculated apical pneumothorax. Irregular opacities at  the left base likely chronic. No left pneumothorax. Upper normal heart size. IMPRESSION: Stable patchy  airspace disease in the right lung. Stable right chest tube with a tiny right basilar pneumothorax There may be a loculated medial right apical pneumothorax with an air-fluid level. Electronically Signed   By: Marybelle Killings M.D.   On: 08/09/2015 07:40   Dg Chest 2 View  08/08/2015  CLINICAL DATA:  Right chest pain.  Pneumothorax. EXAM: CHEST  2 VIEW COMPARISON:  08/07/2015 FINDINGS: Right chest tube remains in stable position. No apical component of the pneumothorax visualize currently, but there is a tiny lateral basilar pneumothorax. Stable patchy right lung airspace disease. No confluent opacity on the left. IMPRESSION: Stable position of the right chest tube. Tiny right basilar lateral pneumothorax. Stable patchy right lung airspace disease. Electronically Signed   By: Rolm Baptise M.D.   On: 08/08/2015 09:53   Dg Chest Port 1 View  08/11/2015  CLINICAL DATA:  81 year old male with a history of respiratory failure. Treated right sided carcinoma. Spontaneous pneumothorax treated with right thoracostomy tube 08/04/2015. Thoracostomy tube has been removed. EXAM: PORTABLE CHEST 1 VIEW COMPARISON:  08/10/2015, 08/09/2015, 08/2015, 08/07/2015 FINDINGS: Cardiomediastinal silhouette unchanged in size and contour. Right heart borders partially obscured by overlying lung/ pleural disease. Similar appearance of mixed interstitial and airspace opacities of the right lung. No appreciable hydro pneumothorax on the current chest x-ray. Blunting of the right costophrenic angle again, similar to prior. Left lung relatively well aerated. IMPRESSION: No appreciable hydro pneumothorax on the current chest x-ray. Mixed interstitial and airspace disease of the right lung with likely small pleural effusion, unchanged from the comparison, in this patient with known treated right lung carcinoma. Signed, Dulcy Fanny. Earleen Newport, DO [facility] Electronically Signed   By: Corrie Mckusick D.O.   On: 08/11/2015 08:55     EKG: (Independently reviewed) atrial flutter with ventricular rate of bees per minute, QTC 449 ms, no definitive ischemic changes.   Assessment & Plan  Principal Problem:   Pleuritic chest pain/history of CAD -Telemetry -Appears more consistent with pleuritic and nonischemic chest pain but as precaution we'll cycle enzymes -Although d-dimer slightly elevated d-dimer also can be elevated in setting of malignancy; patient has had no respiratory symptoms and chronic dyspnea is stable as is chronic O2 requirement so low index of suspicion chest pain related to PE -Pain improved with leaning forward and suspicious patient may be experiencing pericarditis -Cardiology consulted-Dr. Tilley's office notified -Check echocardiogram  Active Problems:   Chronic respiratory failure with hypoxia /COPD  -Currently stable on baseline home O2 3 L/m -Continue Spiriva, chronic prednisone, and Symbicort -Continue albuterol for breakthrough symptoms -Followed by Dr. Lamonte Sakai as an outpatient   Mild acute kidney injury on chronic kidney disease -Avoid nephrotoxic medications -Holding Lasix and potassium but will defer IV fluids at this juncture -Appeared hypotensive mildly at presentation so check orthostatic vital signs      Chronic atrial fibrillation (HCC) -Currently rate controlled -Continue digoxin but have pharmacy clarify actual home dosage -Continue Cardizem    Thoracic ascending aortic aneurysm ([place]) -Documented at 4.3 cm there were 2016 -Patient not having tearing type pain and pain has actually improved and nearly resolved at the time I attending physician went to evaluate the patient    Current use of long term anticoagulation -Home Xarelto on hold -Continue IV heparin for now    Abnormal TSH -Problem list documents hyperthyroidism but patient not on medications  for same -TSH August 2016 documented a 0.21 -Repeat TSH    History of  pneumothorax -Spontaneous pneumothorax earlier this month treated with chest tube -X-ray this admission shows no recurrence    SCC (squamous cell carcinoma of lung) ([place]) -Followed by Dr. Julien Nordmann as an outpatient -Depleted chemotherapy and radiation May 2016    DVT Prophylaxis: IV heparin  Family Communication:  Family at bedside   Code Status:  DO NOT RESUSCITATE  Condition:  Stable  Discharge disposition: Anticipate discharge back to home environment once medically stable  Time spent in minutes : 60      [name] L. ANP on 09/30/2015 at 12:12 PM  Between 7am to 7pm - Pager - [phone]  After 7pm go to [email or web address] - password [email or web address]  And look for the night coverage person covering me after hours  [facility]

## 2015-09-07 ENCOUNTER — Ambulatory Visit (HOSPITAL_COMMUNITY): Payer: Medicare Other

## 2015-09-07 ENCOUNTER — Observation Stay (HOSPITAL_COMMUNITY): Payer: Medicare Other

## 2015-09-07 DIAGNOSIS — J439 Emphysema, unspecified: Secondary | ICD-10-CM

## 2015-09-07 LAB — APTT
APTT: 114 s — AB (ref 24–37)
APTT: 154 s — AB (ref 24–37)

## 2015-09-07 LAB — TROPONIN I: Troponin I: 0.05 ng/mL — ABNORMAL HIGH (ref <0.031)

## 2015-09-07 LAB — CBC
HCT: 32.5 % — ABNORMAL LOW (ref 39.0–52.0)
Hemoglobin: 10.1 g/dL — ABNORMAL LOW (ref 13.0–17.0)
MCH: 26.6 pg (ref 26.0–34.0)
MCHC: 31.1 g/dL (ref 30.0–36.0)
MCV: 85.5 fL (ref 78.0–100.0)
PLATELETS: 121 10*3/uL — AB (ref 150–400)
RBC: 3.8 MIL/uL — AB (ref 4.22–5.81)
RDW: 17.8 % — ABNORMAL HIGH (ref 11.5–15.5)
WBC: 8.9 10*3/uL (ref 4.0–10.5)

## 2015-09-07 LAB — HEPARIN LEVEL (UNFRACTIONATED)
Heparin Unfractionated: >2.2 IU/mL — ABNORMAL HIGH (ref 0.30–0.70)
Heparin Unfractionated: >2.2 IU/mL — ABNORMAL HIGH (ref 0.30–0.70)

## 2015-09-07 MED ORDER — RIVAROXABAN 20 MG PO TABS
20.0000 mg | ORAL_TABLET | Freq: Every day | ORAL | Status: DC
  Administered 2015-09-07: 20 mg via ORAL
  Filled 2015-09-07: qty 1

## 2015-09-07 MED ORDER — OXYCODONE-ACETAMINOPHEN 5-325 MG PO TABS
1.0000 | ORAL_TABLET | Freq: Four times a day (QID) | ORAL | Status: DC | PRN
  Administered 2015-09-07: 2 via ORAL
  Administered 2015-09-07: 1 via ORAL
  Filled 2015-09-07 (×2): qty 1
  Filled 2015-09-07: qty 2

## 2015-09-07 MED ORDER — IBUPROFEN 400 MG PO TABS
400.0000 mg | ORAL_TABLET | Freq: Three times a day (TID) | ORAL | Status: DC
  Administered 2015-09-07 – 2015-09-08 (×3): 400 mg via ORAL
  Filled 2015-09-07 (×3): qty 1

## 2015-09-07 MED ORDER — DILTIAZEM HCL 60 MG PO TABS
180.0000 mg | ORAL_TABLET | Freq: Two times a day (BID) | ORAL | Status: DC
  Administered 2015-09-07 – 2015-09-18 (×22): 180 mg via ORAL
  Filled 2015-09-07 (×3): qty 2
  Filled 2015-09-07 (×5): qty 3
  Filled 2015-09-07: qty 2
  Filled 2015-09-07 (×3): qty 3
  Filled 2015-09-07: qty 2
  Filled 2015-09-07 (×5): qty 3
  Filled 2015-09-07: qty 2
  Filled 2015-09-07 (×2): qty 3
  Filled 2015-09-07: qty 2
  Filled 2015-09-07 (×3): qty 3

## 2015-09-07 MED ORDER — COLCHICINE 0.6 MG PO TABS
0.6000 mg | ORAL_TABLET | Freq: Every day | ORAL | Status: DC
  Administered 2015-09-07 – 2015-09-16 (×10): 0.6 mg via ORAL
  Filled 2015-09-07 (×10): qty 1

## 2015-09-07 MED ORDER — CYCLOBENZAPRINE HCL 10 MG PO TABS
10.0000 mg | ORAL_TABLET | Freq: Three times a day (TID) | ORAL | Status: DC
  Administered 2015-09-07 – 2015-09-08 (×4): 10 mg via ORAL
  Filled 2015-09-07 (×4): qty 1

## 2015-09-07 MED ORDER — HEPARIN (PORCINE) IN NACL 100-0.45 UNIT/ML-% IJ SOLN
950.0000 [IU]/h | INTRAMUSCULAR | Status: DC
Start: 2015-09-07 — End: 2015-09-07

## 2015-09-07 NOTE — Progress Notes (Signed)
[facility] ON-CALL NOTIFIED OF 15% LEFT PNEUMOTHORAX ON CT, PER RADIOLOGY DOCTOR.

## 2015-09-07 NOTE — Progress Notes (Signed)
ANTICOAGULATION CONSULT NOTE - Follow Up Consult  Pharmacy Consult for heparin Indication: Afib and r/o PE   Labs:  Recent Labs  10/03/2015 0940 09/12/2015 1452 09/22/2015 2127 09/07/15 0253  HGB 11.4*  --   --  10.1*  HCT 37.4*  --   --  32.5*  PLT 151  --   --  121*  APTT 41*  --   --  114*  HEPARINUNFRC >2.00*  --   --   --   CREATININE 1.44*  --   --   --   TROPONINI  --  0.05* 0.05*  --      Assessment: 82yo male supratherapeutic on heparin with initial dosing while Xarelto held.  Goal of Therapy:  aPTT 66-102 seconds   Plan: Will decrease heparin gtt by 2-3 units/kg/hr to 1300 units/hr and check PTT in 8hr.  Wynona Neat, PharmD, BCPS  09/07/2015,3:39 AM

## 2015-09-07 NOTE — Progress Notes (Signed)
[facility] PROGRESS NOTE  [name] XBD:[id number] DOB: 1933/09/07 DOA: 10/04/2015 PCP: [name], MD  Assessment/Plan: 1. Chest pain- patient has a pleuritic-type chest pain, likely pericarditis. Started on ibuprofen, colchicine per cardiology. Patient also fell 2 weeks ago, will check CT chest without contrast and start Flexeril 10 mg by mouth 3 times a day. 2. Chronic respiratory failure with hypoxia/COPD- currently patient is stable on home oxygen, continue prednisone, Spiriva, Symbicort. 3. Acute kidney injury on CKD- patient's BUN/creatinine 28/1.44, mild elevation of creatinine his baseline creatinine was 1.01 on 08/11/2015. Follow BMP in a.m. 4. Chronic atrial fibrillation- heart rate is controlled, continue digoxin, Cardizem. Patient on anticoagulation with that Xarelto. 5. Recent pneumothorax- patient had recent spontaneous pneumothorax last month which was treated with chest tube placement. Chest x-ray in the ED does not show pneumothorax. 6. Squamous cell carcinoma of the lung- followed by oncology as outpatient  Code Status: DO NOT RESUSCITATE Family Communication: *Discussed with patient's daughter at bedside Disposition Plan: Pending improvement in chest pain   Consultants:  Cardiology  Procedures:  None  Antibiotics:  None  HPI/Subjective: 81 year old male patient known chronic hypoxemic respiratory failure secondary to COPD on 3 L oxygen, no non-small cell lung cancer who completed chemotherapy and radiation in May 2016, Recent admission for spontaneous pneumothorax on 11/5,who presents to the ER with abrupt onset of left sided anterior pleuritic-type chest pain. Echocardiogram done this morning shows small pericardial effusion. Patient started on ibuprofen, colchicine and Flexeril.  Today patient complains of chest pain mainly located in the anterior region.   Objective: Filed Vitals:   09/07/15 1024 09/07/15 1329  BP:  104/61  Pulse: 90  71  Temp:  97.7 F (36.5 C)  Resp:  17    Intake/Output Summary (Last 24 hours) at 09/07/15 1348 Last data filed at 09/07/15 1300  Gross per 24 hour  Intake    360 ml  Output    825 ml  Net   -465 ml   Filed Weights   09/12/2015 1000  Weight: 83.915 kg (185 lb)    Exam:   General:  Appears in no acute distress  Cardiovascular: S1-S2 regular  Respiratory: Clear to auscultation bilaterally  Abdomen: Soft, nontender, no organomegaly  Musculoskeletal: No cyanosis/clubbing/edema of the lower extremities   Data Reviewed: Basic Metabolic Panel:  Recent Labs Lab 09/23/2015 0940  NA 135  K 3.7  CL 98*  CO2 26  GLUCOSE 113*  BUN 28*  CREATININE 1.44*  CALCIUM 9.2   Liver Function Tests: No results for input(s): AST, ALT, ALKPHOS, BILITOT, PROT, ALBUMIN in the last 168 hours. No results for input(s): LIPASE, AMYLASE in the last 168 hours. No results for input(s): AMMONIA in the last 168 hours. CBC:  Recent Labs Lab 09/08/2015 0940 09/07/15 0253  WBC 12.7* 8.9  NEUTROABS 10.9*  --   HGB 11.4* 10.1*  HCT 37.4* 32.5*  MCV 86.2 85.5  PLT 151 121*   Cardiac Enzymes:  Recent Labs Lab 09/10/2015 1452 09/20/2015 2127 09/07/15 0253  TROPONINI 0.05* 0.05* 0.05*   BNP (last 3 results)  Recent Labs  05/15/15 1650 05/16/15 0458 09/24/2015 0940  BNP 123.1* 268.1* 135.7*    ProBNP (last 3 results)  Recent Labs  05/31/15 1705  PROBNP 91.0      Studies: Dg Chest 2 View  09/07/2015  CLINICAL DATA:  History of lung cancer.  History pneumothorax EXAM: CHEST  2 VIEW COMPARISON:  08/16/2015 FINDINGS: COPD. Streaky markings throughout the right lung  are unchanged. This may be due to scarring and/or tumor. No significant pneumothorax.  No pleural effusion. Left lung is clear.  Negative for heart failure IMPRESSION: Extensive chronic changes in the right lung due to scarring and/or tumor. No pneumothorax. Electronically Signed   By: Franchot Gallo M.D.   On: 09/17/2015  10:14    Scheduled Meds: . budesonide-formoterol  2 puff Inhalation BID  . colchicine  0.6 mg Oral Daily  . cyclobenzaprine  10 mg Oral TID  . digoxin  0.125 mg Oral Daily  . diltiazem  90 mg Oral QID  . ibuprofen  400 mg Oral TID  . predniSONE  10 mg Oral Q breakfast  . rivaroxaban  20 mg Oral Q supper  . tiotropium  18 mcg Inhalation Daily  . triamcinolone cream   Topical TID   Continuous Infusions:   Principal Problem:   Pleuritic chest pain Active Problems:   Chronic atrial fibrillation (HCC)   History of tobacco abuse   SCC (squamous cell carcinoma of lung) (HCC)   CAD (coronary artery disease), native coronary artery   Thoracic ascending aortic aneurysm (HCC)   Current use of long term anticoagulation   History of pneumothorax   COPD (chronic obstructive pulmonary disease) (HCC)   Chronic respiratory failure with hypoxia ([place])    Time spent: 25 min    [facility]  [facility] Pager [phone]*. If 7PM-7AM, please contact night-coverage at [email or web address], password [email or web address] 09/07/2015, 1:48 PM  LOS: 1 day

## 2015-09-07 NOTE — Progress Notes (Signed)
Utilization review completed.  

## 2015-09-07 NOTE — Consult Note (Addendum)
WOC wound consult note Reason for Consult: Consult requested for right arm abrasion from fall prior to admission.  Pt's grand daughter applied a dressing at that time and site is almost healed at this time. Wound type: Full thickness to right upper arm Measurement: Remaining open wound is approx .8X.2X.1cm Wound bed: Red and moist, scant amt bloody drainage, no odor. Surrounded by pink dry scar tissue. Dressing procedure/placement/frequency: Applied foam dressing to protect and promote healing. Discussed plan of care with patient and he verbalized understanding.   Please re-consult if further assistance is needed.  Thank-you,  Julien Girt MSN, [place], [place], [place], [place]

## 2015-09-07 NOTE — Progress Notes (Signed)
This admission has been reviewed and determined not to meet inpatient level of care. Both attending Physician and Medical Director are in agreement this should be an Observation encounter according to the Medicare Conditions of Participation as set forth in CFR 42 Chapter 456 482.12 (c) and the Medicare Condition Code-44 Regulations CFR 42 Chapter 100 - 04 50.3. The Patient and/or Patient Representative was notified via delivery of the "MEDICARE OBSERVATION STATUS NOTIFICATION".              

## 2015-09-07 NOTE — Progress Notes (Signed)
PHARMACY NOTE  Consult :  Heparin Indication :  atrial fibrillation and r/o VTE  Heparin Dosing Wt :  84 kg  LABS :  Recent Labs  09/18/2015 0940 09/07/15 0253 09/07/15 1117  HGB 11.4* 10.1*  --   HCT 37.4* 32.5*  --   PLT 151 121*  --   APTT 41* 114* 154*  HEPARINUNFRC >2.20*  --  >2.20*  CREATININE 1.44*  --   --     MEDICATION: Infusion[s]: Infusions:  . heparin 1,300 Units/hr (09/07/15 0343)   ASSESSMENT :  81 y.o. male is currently on Heparin bridging while Xarelto is being held for history of atrial fibrillation and r/o VTE.  Heparin infusing at 1300 units/hr. Heparin level > 2.2 units/ml which is high, but expected, due to effect of Xarelto on Heparin levels.  aPTT 114 > 154 seconds after rate decrease.  Labs drawn appropriately.  On obvious bleeding noted.  GOAL :  Heparin Level  0.3 - 0.7 units/ml  aPTT 66 - 102 seconds.  PLAN : 1. Hold Heparin x 1 hour, then 2. Restart Heparin at lower rate, 950 units/hr. 3. Follow-up Heparin level, CBC, and aPTT in 8 hours. 4. Daily Heparin level, aPTT, CBC while on Heparin.  Monitor for bleeding complications. Follow Platelet counts.  Marthenia Rolling,  Pharm.D   09/07/2015,  12:47 PM

## 2015-09-07 NOTE — Progress Notes (Signed)
Subjective:  He had a lot of chest pain last night mainly pleuritic and is now more anterior.  The echocardiogram shows a small moderate effusion somewhat eccentric in that it is more present anteriorly and at the apex and posteriorly.  Still don't hear a rub on him.  Not really short of breath.  Objective:  Vital Signs in the last 24 hours: BP 107/66 mmHg  Pulse 90  Temp(Src) 98.2 F (36.8 C) (Oral)  Resp 18  Ht '5\' 10"'$  (1.778 m)  Wt 83.915 kg (185 lb)  BMI 26.54 kg/m2  SpO2 97%  Physical Exam: Pleasant male in no acute distress Lungs:  Clear Cardiac:  Irregular rhythm, normal S1 and S2, no S3, 2/6 systolic murmur in aortic area radiating somewhat to neck Abdomen:  Soft, nontender, no masses Extremities:  No edema present Skin: Extensive ecchymoses noted over back where he fell 2 weeks ago.  Intake/Output from previous day: 12/01 0701 - 12/02 0700 In: -  Out: 625 [Urine:625]  Weight Filed Weights   09/30/2015 1000  Weight: 83.915 kg (185 lb)    Lab Results: Basic Metabolic Panel:  Recent Labs  09/23/2015 0940  NA 135  K 3.7  CL 98*  CO2 26  GLUCOSE 113*  BUN 28*  CREATININE 1.44*   CBC:  Recent Labs  09/24/2015 0940 09/07/15 0253  WBC 12.7* 8.9  NEUTROABS 10.9*  --   HGB 11.4* 10.1*  HCT 37.4* 32.5*  MCV 86.2 85.5  PLT 151 121*   Cardiac Enzymes: Troponin (Point of Care Test)  Recent Labs  09/26/2015 0946  TROPIPOC 0.10*   Cardiac Panel (last 3 results)  Recent Labs  09/14/2015 1452 09/22/2015 2127 09/07/15 0253  TROPONINI 0.05* 0.05* 0.05*    Telemetry: Atrial fibrillation with controlled response  Assessment/Plan:  1.  Continued pleuritic chest pain that is shifted anteriorly.  It still is somewhat positional and I do not hear a rub.  He has a small to moderate effusion which is somewhat loculated.  This is likely the cause of his pain but he tells be he fell 2 weeks ago and could've had some injuries from that that are now showing up. 2.   Chronic atrial fibrillation 3.  Long-term anticoagulation with several to the  Recommendations:  He is currently receiving IV heparin when the XARELTO was held yesterday for unclear reasons.  The chart mentions something about DVT despite anticoagulation.  I think at take him off of IV heparin and restart his XARELTO.  I would treat the pericarditis with ibuprofen as well as low dose colchicine and if you can get his pain adequately controlled but in go home.  He will need a follow-up echo in a couple of weeks.  At this point the etiology of the effusion is unclear.  He has had radiation treatment and also has malignancy and it could be related to either of these 2 things.  I think a viral pericarditis is less likely.     Kerry Hough  MD [facility] Card[facility]  09/07/2015, 1:10 PM

## 2015-09-07 NOTE — Progress Notes (Signed)
  Echocardiogram 2D Echocardiogram has been performed.  [name] 09/07/2015, 9:50 AM

## 2015-09-08 ENCOUNTER — Observation Stay (HOSPITAL_COMMUNITY): Payer: Medicare Other

## 2015-09-08 DIAGNOSIS — I482 Chronic atrial fibrillation: Secondary | ICD-10-CM

## 2015-09-08 DIAGNOSIS — C3491 Malignant neoplasm of unspecified part of right bronchus or lung: Secondary | ICD-10-CM

## 2015-09-08 DIAGNOSIS — R0781 Pleurodynia: Secondary | ICD-10-CM | POA: Diagnosis not present

## 2015-09-08 DIAGNOSIS — Z7901 Long term (current) use of anticoagulants: Secondary | ICD-10-CM

## 2015-09-08 DIAGNOSIS — R410 Disorientation, unspecified: Secondary | ICD-10-CM | POA: Diagnosis present

## 2015-09-08 DIAGNOSIS — J939 Pneumothorax, unspecified: Secondary | ICD-10-CM | POA: Diagnosis not present

## 2015-09-08 DIAGNOSIS — J441 Chronic obstructive pulmonary disease with (acute) exacerbation: Secondary | ICD-10-CM | POA: Diagnosis present

## 2015-09-08 LAB — BASIC METABOLIC PANEL WITH GFR
Anion gap: 9 (ref 5–15)
BUN: 38 mg/dL — ABNORMAL HIGH (ref 6–20)
CO2: 25 mmol/L (ref 22–32)
Calcium: 9.1 mg/dL (ref 8.9–10.3)
Chloride: 101 mmol/L (ref 101–111)
Creatinine, Ser: 1.55 mg/dL — ABNORMAL HIGH (ref 0.61–1.24)
GFR calc Af Amer: 47 mL/min — ABNORMAL LOW
GFR calc non Af Amer: 41 mL/min — ABNORMAL LOW
Glucose, Bld: 111 mg/dL — ABNORMAL HIGH (ref 65–99)
Potassium: 4.5 mmol/L (ref 3.5–5.1)
Sodium: 135 mmol/L (ref 135–145)

## 2015-09-08 LAB — TYPE AND SCREEN
ABO/RH(D): AB POS
Antibody Screen: NEGATIVE

## 2015-09-08 LAB — CBC
HCT: 34.7 % — ABNORMAL LOW (ref 39.0–52.0)
Hemoglobin: 10.3 g/dL — ABNORMAL LOW (ref 13.0–17.0)
MCH: 25.9 pg — ABNORMAL LOW (ref 26.0–34.0)
MCHC: 29.7 g/dL — ABNORMAL LOW (ref 30.0–36.0)
MCV: 87.2 fL (ref 78.0–100.0)
Platelets: 132 10*3/uL — ABNORMAL LOW (ref 150–400)
RBC: 3.98 MIL/uL — ABNORMAL LOW (ref 4.22–5.81)
RDW: 17.9 % — ABNORMAL HIGH (ref 11.5–15.5)
WBC: 13 10*3/uL — ABNORMAL HIGH (ref 4.0–10.5)

## 2015-09-08 LAB — RENAL FUNCTION PANEL
ALBUMIN: 2.5 g/dL — AB (ref 3.5–5.0)
Anion gap: 8 (ref 5–15)
BUN: 36 mg/dL — AB (ref 6–20)
CALCIUM: 8.7 mg/dL — AB (ref 8.9–10.3)
CHLORIDE: 102 mmol/L (ref 101–111)
CO2: 26 mmol/L (ref 22–32)
CREATININE: 1.42 mg/dL — AB (ref 0.61–1.24)
GFR, EST AFRICAN AMERICAN: 52 mL/min — AB (ref >60)
GFR, EST NON AFRICAN AMERICAN: 45 mL/min — AB (ref >60)
Glucose, Bld: 101 mg/dL — ABNORMAL HIGH (ref 65–99)
Phosphorus: 4.3 mg/dL (ref 2.5–4.6)
Potassium: 4.4 mmol/L (ref 3.5–5.1)
SODIUM: 136 mmol/L (ref 135–145)

## 2015-09-08 LAB — BRAIN NATRIURETIC PEPTIDE: B Natriuretic Peptide: 157.9 pg/mL — ABNORMAL HIGH (ref 0.0–100.0)

## 2015-09-08 LAB — PROTIME-INR
INR: 2.22 — AB (ref 0.00–1.49)
PROTHROMBIN TIME: 24.4 s — AB (ref 11.6–15.2)

## 2015-09-08 LAB — MRSA PCR SCREENING: MRSA by PCR: NEGATIVE

## 2015-09-08 LAB — APTT: aPTT: 41 seconds — ABNORMAL HIGH (ref 24–37)

## 2015-09-08 LAB — GLUCOSE, CAPILLARY: Glucose-Capillary: 92 mg/dL (ref 65–99)

## 2015-09-08 LAB — ALBUMIN: Albumin: 2.6 g/dL — ABNORMAL LOW (ref 3.5–5.0)

## 2015-09-08 LAB — ABO/RH: ABO/RH(D): AB POS

## 2015-09-08 MED ORDER — VANCOMYCIN HCL 10 G IV SOLR
1250.0000 mg | Freq: Once | INTRAVENOUS | Status: AC
  Administered 2015-09-08: 1250 mg via INTRAVENOUS
  Filled 2015-09-08: qty 1250

## 2015-09-08 MED ORDER — PIPERACILLIN-TAZOBACTAM 3.375 G IVPB
3.3750 g | Freq: Three times a day (TID) | INTRAVENOUS | Status: DC
  Administered 2015-09-08 – 2015-09-12 (×11): 3.375 g via INTRAVENOUS
  Filled 2015-09-08 (×13): qty 50

## 2015-09-08 MED ORDER — FUROSEMIDE 10 MG/ML IJ SOLN
20.0000 mg | Freq: Two times a day (BID) | INTRAMUSCULAR | Status: DC
Start: 2015-09-08 — End: 2015-09-09
  Administered 2015-09-08 – 2015-09-09 (×4): 20 mg via INTRAVENOUS
  Filled 2015-09-08 (×5): qty 2

## 2015-09-08 MED ORDER — ALBUTEROL SULFATE (2.5 MG/3ML) 0.083% IN NEBU: 3.0000 mL | INHALATION_SOLUTION | RESPIRATORY_TRACT | Status: DC | PRN

## 2015-09-08 MED ORDER — IPRATROPIUM-ALBUTEROL 0.5-2.5 (3) MG/3ML IN SOLN
3.0000 mL | RESPIRATORY_TRACT | Status: DC
  Administered 2015-09-08 – 2015-09-11 (×16): 3 mL via RESPIRATORY_TRACT
  Filled 2015-09-08 (×18): qty 3

## 2015-09-08 MED ORDER — ETOMIDATE 2 MG/ML IV SOLN
INTRAVENOUS | Status: AC
  Filled 2015-09-08: qty 10

## 2015-09-08 MED ORDER — METHYLPREDNISOLONE SODIUM SUCC 40 MG IJ SOLR
40.0000 mg | Freq: Four times a day (QID) | INTRAMUSCULAR | Status: DC
  Administered 2015-09-08 – 2015-09-12 (×15): 40 mg via INTRAVENOUS
  Filled 2015-09-08 (×22): qty 1

## 2015-09-08 MED ORDER — VANCOMYCIN HCL IN DEXTROSE 750-5 MG/150ML-% IV SOLN
750.0000 mg | Freq: Two times a day (BID) | INTRAVENOUS | Status: DC
  Administered 2015-09-09 – 2015-09-11 (×5): 750 mg via INTRAVENOUS
  Filled 2015-09-08 (×6): qty 150

## 2015-09-08 MED ORDER — ALBUTEROL SULFATE (2.5 MG/3ML) 0.083% IN NEBU
2.5000 mg | INHALATION_SOLUTION | RESPIRATORY_TRACT | Status: DC | PRN
  Administered 2015-09-09 – 2015-09-14 (×4): 2.5 mg via RESPIRATORY_TRACT
  Filled 2015-09-08 (×4): qty 3

## 2015-09-08 NOTE — Progress Notes (Signed)
eLink Physician-Brief Progress Note Patient Name: MIRIAM KESTLER DOB: 04/05/1933 MRN: [id number]   Date of Service  09/08/2015  HPI/Events of Note  CT chest reviewed. Stable pneumothorax. Rt infiltrate c/w pneumonia  eICU Interventions  Hold chest tube placement. Start vanco, zosyn     Intervention Category Intermediate Interventions: Other:  Rosabell Geyer 09/08/2015, 8:39 PM

## 2015-09-08 NOTE — Progress Notes (Signed)
Called 45M to give a report on this pt. Receiving nurse to call me back. Oren Beckmann, RN

## 2015-09-08 NOTE — Progress Notes (Addendum)
Subjective:  He was found to have a 15% pneumothorax on the chest x-ray and CT scan came back last night.  He developed significant respiratory distress and labored breathing that was noted at 3:30 this morning.  He was seen by rapid response at about 9:00 this morning.  He is laying back on bed but still has severe respiratory distress.  Objective:  Vital Signs in the last 24 hours: BP 102/79 mmHg  Pulse 87  Temp(Src) 97.9 F (36.6 C) (Oral)  Resp 22  Ht '5\' 10"'$  (1.778 m)  Wt 83.915 kg (185 lb)  BMI 26.54 kg/m2  SpO2 94%  Physical Exam: Elderly male obese currently in respiratory distress Lungs: Reduced breath sounds on the left side.  Rhonchi bilaterally.   Cardiac:  Irregular rhythm, normal S1 and S2, no S3, somewhat distant heart sounds Abdomen:  Soft, nontender, no masses Extremities:  1+ edema present  Intake/Output from previous day: 12/02 0701 - 12/03 0700 In: 600 [P.O.:600] Out: 200 [Urine:200] Weight Filed Weights   09/27/2015 1000  Weight: 83.915 kg (185 lb)    Lab Results: Basic Metabolic Panel:  Recent Labs  09/24/2015 0940 09/08/15 0355  NA 135 135  K 3.7 4.5  CL 98* 101  CO2 26 25  GLUCOSE 113* 111*  BUN 28* 38*  CREATININE 1.44* 1.55*    CBC:  Recent Labs  09/09/2015 0940 09/07/15 0253 09/08/15 0930  WBC 12.7* 8.9 13.0*  NEUTROABS 10.9*  --   --   HGB 11.4* 10.1* 10.3*  HCT 37.4* 32.5* 34.7*  MCV 86.2 85.5 87.2  PLT 151 121* 132*    BNP    Component Value Date/Time   BNP 135.7* 09/16/2015 0940    PROTIME: Lab Results  Component Value Date   INR 1.78* 05/16/2015   INR 1.13 11/25/2014   INR 1.64* 04/10/2011    Telemetry: Atrial fibrillation   Assessment/Plan:  1.  Acute respiratory distress likely due to pneumothorax on the left.  This is concerning because his previous pneumothorax was on the right.  It has been greater than 16 hours since his last CT scan.  Suggest urgent pulmonary consult as he may need to have a chest tube  as he is still in respiratory distress 2.  Possible pericarditis although it is more likely that the pleuritic pain could be due to the pneumothorax.  Recommendations:  Recommend urgent evaluation for the pneumothorax.  He feels poorly and is in significant respiratory distress.     Kerry Hough  MD [facility] Cardiology  09/08/2015, 12:19 PM

## 2015-09-08 NOTE — Progress Notes (Signed)
ON-CALL NOTIFIED OF PATIENT'S SHORTNESS OF BREATH AND LABORED BREATHING AFTER BEING FOUND SITTING ON SIDE OF BED, BUT WITH HEAD TOWARDS FOOTBOARD. ROOMAIR SATS 83%. SATS INCREASED TO LOW 90'S ON 4L.  PATIENT ALERT AND ORIENTED, BUT BARELY ABLE TO SPEAK. VERY WEAK, BUT ABLE TO MOVE ALL EXTREMITIES ON COMMAND.  PATIENT COVERED IN NEW SKIN TEARS TO BOTH UPPER EXTREMITIES. TEARS CLEANSED WITH SALINE-SOAKED GAUZE AND COVERED WITH FOAM DRESSINGS.  BED ALARM ARMED. CALLBELL AT HAND.

## 2015-09-08 NOTE — Discharge Instructions (Signed)
Information on my medicine - XARELTO (Rivaroxaban)  This medication education was reviewed with me or my healthcare representative as part of my discharge preparation.  The pharmacist that spoke with me during my hospital stay was:  Estelle June, [facility]  Why was Xarelto prescribed for you? Xarelto was prescribed for you to reduce the risk of a blood clot forming that can cause a stroke if you have a medical condition called atrial fibrillation (a type of irregular heartbeat).  What do you need to know about xarelto ? Take your Xarelto ONCE DAILY at the same time every day with your evening meal. If you have difficulty swallowing the tablet whole, you may crush it and mix in applesauce just prior to taking your dose.  Take Xarelto exactly as prescribed by your doctor and DO NOT stop taking Xarelto without talking to the doctor who prescribed the medication.  Stopping without other stroke prevention medication to take the place of Xarelto may increase your risk of developing a clot that causes a stroke.  Refill your prescription before you run out.  After discharge, you should have regular check-up appointments with your healthcare provider that is prescribing your Xarelto.  In the future your dose may need to be changed if your kidney function or weight changes by a significant amount.  What do you do if you miss a dose? If you are taking Xarelto ONCE DAILY and you miss a dose, take it as soon as you remember on the same day then continue your regularly scheduled once daily regimen the next day. Do not take two doses of Xarelto at the same time or on the same day.   Important Safety Information A possible side effect of Xarelto is bleeding. You should call your healthcare provider right away if you experience any of the following: ? Bleeding from an injury or your nose that does not stop. ? Unusual colored urine (red or dark brown) or unusual colored stools (red or  black). ? Unusual bruising for unknown reasons. ? A serious fall or if you hit your head (even if there is no bleeding).  Some medicines may interact with Xarelto and might increase your risk of bleeding while on Xarelto. To help avoid this, consult your healthcare provider or pharmacist prior to using any new prescription or non-prescription medications, including herbals, vitamins, non-steroidal anti-inflammatory drugs (NSAIDs) and supplements.  This website has more information on Xarelto: [email or web address]

## 2015-09-08 NOTE — Progress Notes (Signed)
ANTIBIOTIC CONSULT NOTE - INITIAL  Pharmacy Consult for zosyn, vancomycin Indication: rule out sepsis  Allergies  Allergen Reactions  . Codeine Itching    Patient Measurements: Height: '5\' 10"'$  (177.8 cm) Weight: 179 lb 3.7 oz (81.3 kg) IBW/kg (Calculated) : 73  Vital Signs: Temp: 97.7 F (36.5 C) (12/03 2006) Temp Source: Oral (12/03 2006) BP: 131/65 mmHg (12/03 2000) Pulse Rate: 92 (12/03 2000) Intake/Output from previous day: 12/02 0701 - 12/03 0700 In: 600 [P.O.:600] Out: 200 [Urine:200] Intake/Output from this shift: Total I/O In: -  Out: 200 [Urine:200]  Labs:  Recent Labs  09/15/2015 0940 09/07/15 0253 09/08/15 0355 09/08/15 0930 09/08/15 1550  WBC 12.7* 8.9  --  13.0*  --   HGB 11.4* 10.1*  --  10.3*  --   PLT 151 121*  --  132*  --   CREATININE 1.44*  --  1.55*  --  1.42*   Estimated Creatinine Clearance: 42.8 mL/min (by C-G formula based on Cr of 1.42). No results for input(s): VANCOTROUGH, VANCOPEAK, VANCORANDOM, GENTTROUGH, GENTPEAK, GENTRANDOM, TOBRATROUGH, TOBRAPEAK, TOBRARND, AMIKACINPEAK, AMIKACINTROU, AMIKACIN in the last 72 hours.   Assessment: 81 yo male presenting with dyspnea and L pneumo  PMH: Stage 3 NSCLC, COPD, AFib  Patient had rapid response earlier in the day for dyspnea  Goal of Therapy:  Vancomycin trough level 15-20 mcg/ml  Plan:  Zosyn 3.375 gm IV q8h Vancomycin 1250 mg x 1 then 750 mg q12h Monitor culture results, renal fx, VT Prn  Levester Fresh, PharmD, BCPS, [facility] Clinical Pharmacist Pager [phone] 09/08/2015 8:47 PM

## 2015-09-08 NOTE — Significant Event (Signed)
CRapid Response Event Note  Overview:  called to see pateint by respiratory therapist for nursing consult and to put on 'radar.'    Initial Focused Assessment: Patient is short of breath, tachypneic. All other VS stable. Assisted with incentive spirometry, suction set up by coworkers, consulted with RN regarding need for oral hygiene, and patient instructed on how to deep breath and cough. History reveals squamous cell CA of lung (refusing treatment), COPD, chronic ascending thoracic aneurysm, a fib; admitted with pleuritic pain and now coughing up blood (by report). Oral cavity reveals dried blood. Currently with 15% pneumothorax.   Interventions: MD at bedside ordering tests. Will assist as needed.   Event Summary:   Patient stayed on unit; stable at  9:03A.    at          Baron Hamper

## 2015-09-08 NOTE — Progress Notes (Signed)
Report called to receiving RN, CCMD notified of transfer. Pt transferred to 28M via bed as ordered accompanied by 2 RN. Pt belongings given to family.Chrys Racer ................................................Marland Kitchen

## 2015-09-08 NOTE — Progress Notes (Signed)
Dr Georgiann Mohs paged about P's SOB, with crackels all over his lungs,breathing tmnt did not help coughs sputum with blood New order for portable chest x ray stat give. Fola RN......................Marland Kitchen

## 2015-09-08 NOTE — Progress Notes (Addendum)
[facility] PROGRESS NOTE  ZALEN SEQUEIRA IDP:[id number] DOB: [birth year] DOA: 09/24/2015 PCP: [name], MD  Assessment/Plan: 1. Acute hypoxic respiratory failure - secondary to COPD/Pneumothorax- CT chest confirms left-sided pneumothorax, earlier patient had pneumothorax on the right which required chest tube placement. I called and discussed with the PCCM Dr. Ashok Cordia , who will see the patient as consult. continue prednisone, Spiriva, Symbicort. 2. Pericardial effusion- echocardiogram shows small-to-moderate sized pericardial effusion, cardiology following 3. Acute kidney injury on CKD- patient's BUN/creatinine 38/1.55, mild elevation of creatinine his baseline creatinine was 1.01 on 08/11/2015. Follow BMP in a.m. 4. Chronic atrial fibrillation- heart rate is controlled, continue digoxin, Cardizem. Patient on anticoagulation with that Xarelto. 5. Squamous cell carcinoma of the lung- followed by oncology as outpatient  Code Status: DO NOT RESUSCITATE Family Communication: *Discussed with patient's daughter at bedside Disposition Plan: Pending improvement in chest pain   Consultants:  Cardiology  Procedures:  None  Antibiotics:  None  HPI/Subjective: 81 year old male patient known chronic hypoxemic respiratory failure secondary to COPD on 3 L oxygen, no non-small cell lung cancer who completed chemotherapy and radiation in May 2016, Recent admission for spontaneous pneumothorax on 11/5,who presents to the ER with abrupt onset of left sided anterior pleuritic-type chest pain. Echocardiogram done this morning shows small pericardial effusion. Patient started on ibuprofen, colchicine and Flexeril.  This morning patient was noted to have increased work of breathing and rapid response team was called. Patient complained of shortness of breath and also coughing up blood. Last night CT thorax was done which showed 15% pneumothorax on the left. Nurse practitioner on call for  [facility] called cardiovascular thoracic surgery and discussed in detail. CVTS  recommended that as patient was stable, this could be deferred until a.m.     Objective: Filed Vitals:   09/08/15 0326 09/08/15 0904  BP: 120/56 102/79  Pulse: 86 87  Temp: 98.7 F (37.1 C) 97.9 F (36.6 C)  Resp: 24 22    Intake/Output Summary (Last 24 hours) at 09/08/15 1442 Last data filed at 09/08/15 0900  Gross per 24 hour  Intake    480 ml  Output    200 ml  Net    280 ml   Filed Weights   10/03/2015 1000  Weight: 83.915 kg (185 lb)    Exam:   General:  Appears in no acute distress  Cardiovascular: S1-S2 regular  Respiratory: Bilateral rhonchi  Abdomen: Soft, nontender, no organomegaly  Musculoskeletal: Bilateral 1+ pitting edema  Data Reviewed: Basic Metabolic Panel:  Recent Labs Lab 09/20/2015 0940 09/08/15 0355  NA 135 135  K 3.7 4.5  CL 98* 101  CO2 26 25  GLUCOSE 113* 111*  BUN 28* 38*  CREATININE 1.44* 1.55*  CALCIUM 9.2 9.1   Liver Function Tests:  Recent Labs Lab 09/08/15 0930  ALBUMIN 2.6*   No results for input(s): LIPASE, AMYLASE in the last 168 hours. No results for input(s): AMMONIA in the last 168 hours. CBC:  Recent Labs Lab 09/24/2015 0940 09/07/15 0253 09/08/15 0930  WBC 12.7* 8.9 13.0*  NEUTROABS 10.9*  --   --   HGB 11.4* 10.1* 10.3*  HCT 37.4* 32.5* 34.7*  MCV 86.2 85.5 87.2  PLT 151 121* 132*   Cardiac Enzymes:  Recent Labs Lab 09/16/2015 1452 09/14/2015 2127 09/07/15 0253  TROPONINI 0.05* 0.05* 0.05*   BNP (last 3 results)  Recent Labs  05/16/15 0458 09/13/2015 0940 09/08/15 1352  BNP 268.1* 135.7* 157.9*    ProBNP (last  3 results)  Recent Labs  05/31/15 1705  PROBNP 91.0      Studies: Ct Chest Wo Contrast  09/07/2015  CLINICAL DATA:  Marked chest pain last night. History of squamous cell carcinoma of the lung, radiation pneumonitis, ascending thoracic aortic aneurysm, COPD and smoking. EXAM: CT CHEST  WITHOUT CONTRAST TECHNIQUE: Multidetector CT imaging of the chest was performed following the standard protocol without IV contrast. COMPARISON:  Chest radiographs obtained yesterday. Chest CT dated 08/04/2015. FINDINGS: Mediastinum/Lymph Nodes: Atheromatous arterial calcifications, including dense coronary artery calcifications. No enlarged lymph nodes. Lungs/Pleura: Interval approximately 15% left anterior pneumothorax. Resolution of the previously seen right pneumothorax. Minimal bilateral pleural fluid. Extensive bullous changes in both lungs, most pronounced in the upper lobes. The previously demonstrated irregular right suprahilar mass currently measures 3.4 x 1.8 cm on image number 21 and previously measured 4.5 x 2.0 cm in corresponding dimensions. Interval extensive patchy density in the inferior right lower lobe with multiple small cystic spaces or bullous changes. Similar changes in the right upper lobe are less prominent. These changes are obscuring the previously noted 6 mm right lower lobe nodule. Smaller amount of interval similar changes in the medial aspect of the left lower lobe. The previously noted 8 mm right upper lobe nodule measures 9 mm on image number 22. Small left upper lobe calcified granuloma. An 8 mm nodule in the left lower lobe on image number 41 is unchanged. A 5 mm nodule in the left lower lobe on image number 36 is also unchanged. Upper abdomen: Bilateral renal calculi are again demonstrated. A left renal upper pole cyst is again demonstrated. Cholecystectomy clips. Musculoskeletal: Thoracic spine degenerative changes. IMPRESSION: 1. Approximately 15% left pneumothorax. 2. Interval mild decrease in size of the spiculated right suprahilar mass. 3. No significant change in visualized bilateral lung nodules. 4. Extensive changes of COPD with centrilobular emphysema. 5. Interval probable bilateral lower lobe pneumonitis, greater on the right. 6. Bilateral renal calculi. Critical  Value/emergent results were called by telephone at the time of interpretation on 09/07/2015 at 8:34 pm to [facility], the patient's nurse, who verbally acknowledged these results. Electronically Signed   By: Claudie Revering M.D.   On: 09/07/2015 20:36    Scheduled Meds: . colchicine  0.6 mg Oral Daily  . digoxin  0.125 mg Oral Daily  . diltiazem  180 mg Oral Q12H  . furosemide  20 mg Intravenous BID  . ipratropium-albuterol  3 mL Nebulization Q4H  . methylPREDNISolone (SOLU-MEDROL) injection  40 mg Intravenous Q6H  . triamcinolone cream   Topical TID   Continuous Infusions:   Principal Problem:   Pleuritic chest pain Active Problems:   Chronic atrial fibrillation (HCC)   History of tobacco abuse   SCC (squamous cell carcinoma of lung) (HCC)   CAD (coronary artery disease), native coronary artery   Thoracic ascending aortic aneurysm (HCC)   Current use of long term anticoagulation   History of pneumothorax   COPD (chronic obstructive pulmonary disease) (HCC)   Chronic respiratory failure with hypoxia (HCC)   Pneumothorax on left   COPD exacerbation (HCC)   Acute delirium    Time spent: 25 min    [facility]  [facility] Pager [phone]*. If 7PM-7AM, please contact night-coverage at [email or web address], password [facility] 09/08/2015, 2:42 PM  LOS: 2 days

## 2015-09-08 NOTE — Progress Notes (Signed)
On-call notified of patient's returned labored breathing and crackles.

## 2015-09-08 NOTE — Consult Note (Addendum)
PULMONARY / CRITICAL CARE MEDICINE   Name: [name] MRN: [id number] DOB: [birth year]    ADMISSION DATE:  09/08/2015 CONSULTATION DATE:  09/08/2015  REFERRING MD:  Hospitalist Service  CHIEF COMPLAINT:  Dyspnea & Left Pneumothorax  HISTORY OF PRESENT ILLNESS:  81 year old male with known stage III non-small cell lung cancer right lung. Patient admitted to hospital on 12/01 with left-sided pleuritic chest pain. Patient at this time is altered and unable to provide an accurate history. Daughters at bedside provided the patient's history. Patient is been having intermittent coughing productive of blood-tinged phlegm. No sore throat or sinus congestion. No subjective fever, chills, or sweats. Patient is on chronic prednisone therapy at 10 mg by mouth daily. Known underlying COPD. Patient reportedly was in his normal state of mind yesterday and conversing appropriately. CT scan of the chest was performed at 1858 hrs. on 12/2 revealing a left-sided pneumothorax. I was consulted this morning to provide recommendations on left-sided pneumothorax as well as increased work of breathing.  PAST MEDICAL HISTORY :  Past Medical History  Diagnosis Date  . COPD (chronic obstructive pulmonary disease) ([place])   . Chronic atrial fibrillation ([place])   . GERD (gastroesophageal reflux disease)   . Coronary artery disease   . Kidney stone   . CAD (coronary artery disease), native coronary artery 12/03/2014  . Thoracic ascending aortic aneurysm ([place]) 12/03/2014    4.2 x 4.3 cm noted on CT scan February 2016   . Radiation 01/04/15-02/16/15    right upper chest/hilar region 60 Gy  . Chronic respiratory failure ([place]) 06/01/2015    - 05/31/2015   Walked 3lpm x one lap @ 185 stopped due to sob with sats still 94% slow pace    rx as of 05/31/2015 = 3lpm 24/7   . COPD 11/25/2014    - Spirometry 09/12/2005  FEV11.05 with ratio 27  - 06/15/2015 p extensive coaching HFA effectiveness =    90%      . Current use of long term  anticoagulation   . History of tobacco abuse 11/25/2014  . Hyperthyroidism 06/01/2015  . SCC (squamous cell carcinoma of lung) ([place]) 11/29/2014    Stage IIIA (T3N1M0) - treated with concurrent chemoradiation with weekly carboplatin for AUC of 2 and paclitaxel 45 MG/M2. Last dose 02/12/2015 with partial response  . Radiation pneumonitis ([place])   . Pneumothorax, right 08/04/2015  . On home oxygen therapy     "I sleep w/it q night; use it during the day if I need it; 3L" (10/01/2015)  . Macular degeneration of both eyes     "wet and dry'    PAST SURGICAL HISTORY: Past Surgical History  Procedure Laterality Date  . Appendectomy    . Cholecystectomy    . Esophageal dilation  2012  . Video bronchoscopy Bilateral 11/29/2014    Procedure: VIDEO BRONCHOSCOPY WITHOUT FLUORO;  Surgeon: Collene Gobble, MD;  Location: [facility];  Service: Cardiopulmonary;  Laterality: Bilateral;  . Esophagogastroduodenoscopy N/A 11/30/2014    Procedure: ESOPHAGOGASTRODUODENOSCOPY (EGD);  Surgeon: Arta Silence, MD;  Location: [facility] ENDOSCOPY;  Service: Endoscopy;  Laterality: N/A;     Allergies  Allergen Reactions  . Codeine Itching    No current facility-administered medications on file prior to encounter.   Current Outpatient Prescriptions on File Prior to Encounter  Medication Sig  . albuterol (PROAIR HFA) 108 (90 BASE) MCG/ACT inhaler Inhale 2 puffs into the lungs every 6 (six) hours as needed for wheezing or shortness of breath.  Marland Kitchen  budesonide-formoterol (SYMBICORT) 160-4.5 MCG/ACT inhaler Inhale 2 puffs into the lungs 2 (two) times daily.  Marland Kitchen dextromethorphan-guaiFENesin (MUCINEX DM) 30-600 MG per 12 hr tablet Take 1 tablet by mouth daily.  Marland Kitchen diltiazem (CARDIZEM) 90 MG tablet Take 1 tablet (90 mg total) by mouth 4 (four) times daily. (Patient taking differently: Take 180 mg by mouth 2 (two) times daily with a meal. Take 2 x 90 mg tablets twice daily before breakfast and supper.)  . furosemide (LASIX) 40 MG  tablet Take 40 mg by mouth daily.  . potassium chloride SA (KLOR-CON M20) 20 MEQ tablet Take 20 mEq by mouth daily.   . rivaroxaban (XARELTO) 20 MG TABS tablet Take 20 mg by mouth daily with supper.  . tiotropium (SPIRIVA) 18 MCG inhalation capsule Place 1 capsule (18 mcg total) into inhaler and inhale daily.  . traMADol (ULTRAM) 50 MG tablet Take 1 tablet (50 mg total) by mouth every 6 (six) hours as needed for moderate pain (cough or mild pain).  . triamcinolone cream (KENALOG) 0.1 % apply to affected area daily as needed for skin irritation  . zolpidem (AMBIEN) 10 MG tablet Take 10 mg by mouth at bedtime as needed for sleep.   Marland Kitchen digoxin (LANOXIN) 0.25 MG tablet 1 tablet by mouth daily for heart rate control (Patient not taking: Reported on 09/11/2015)  . predniSONE (DELTASONE) 20 MG tablet Take 1 tablet (20 mg total) by mouth daily with breakfast. (Patient not taking: Reported on 09/14/2015)    FAMILY HISTORY:  Family History  Problem Relation Age of Onset  . Stroke    . Cancer    . Heart attack Sister   . Lung cancer Daughter     smokes    SOCIAL HISTORY: Social History  Substance Use Topics  . Smoking status: Former Smoker -- 2.00 packs/day for 65 years    Types: Cigarettes    Quit date: 12/12/2009  . Smokeless tobacco: Never Used  . Alcohol Use: No     Comment: has not drank in 4-5 years.  Has a beer every once in awhile.    REVIEW OF SYSTEMS:  An accurate review of systems is unobtainable as patient has delirium & is altered.  SUBJECTIVE:   VITAL SIGNS: BP 102/79 mmHg  Pulse 87  Temp(Src) 97.9 F (36.6 C) (Oral)  Resp 22  Ht '5\' 10"'$  (1.778 m)  Wt 185 lb (83.915 kg)  BMI 26.54 kg/m2  SpO2 94%  HEMODYNAMICS:    VENTILATOR SETTINGS:    INTAKE / OUTPUT: I/O last 3 completed shifts: In: 600 [P.O.:600] Out: 625 [Urine:625]  PHYSICAL EXAMINATION: General:  Awake. Alert. Daughters at bedside. Integument:  Warm & dry. No rash on exposed skin. Bruising of various  ages on bilateral upper extremities. HEENT:  Tacky mucus membranes. No scleral injection or icterus. Pupils equal and round. Cardiovascular:  Regular rate. No edema. No appreciable JVD.  Pulmonary:  Coarse breath sounds bilaterally. Symmetric chest wall expansion. Mildly increased work of breathing on nasal cannula oxygen. Abdomen: Soft. Normal bowel sounds. Nondistended. Grossly nontender. Neurological: Patient grossly nonfocal but not oriented to place or time. Following commands and moving all 4 extremities. Musculoskeletal: Normal bulk & tone. No joint deformity or effusion appreciated. Lymphatics: No appreciated cervical or supraclavicular lymphadenopathy.  LABS:  BMET  Recent Labs Lab 09/13/2015 0940 09/08/15 0355  NA 135 135  K 3.7 4.5  CL 98* 101  CO2 26 25  BUN 28* 38*  CREATININE 1.44* 1.55*  GLUCOSE 113* 111*  Electrolytes  Recent Labs Lab 10/05/2015 0940 09/08/15 0355  CALCIUM 9.2 9.1    CBC  Recent Labs Lab 09/13/2015 0940 09/07/15 0253 09/08/15 0930  WBC 12.7* 8.9 13.0*  HGB 11.4* 10.1* 10.3*  HCT 37.4* 32.5* 34.7*  PLT 151 121* 132*    Coag's  Recent Labs Lab 10/06/2015 0940 09/07/15 0253 09/07/15 1117  APTT 41* 114* 154*    Sepsis Markers No results for input(s): LATICACIDVEN, PROCALCITON, O2SATVEN in the last 168 hours.  ABG No results for input(s): PHART, PCO2ART, PO2ART in the last 168 hours.  Liver Enzymes  Recent Labs Lab 09/08/15 0930  ALBUMIN 2.6*    Cardiac Enzymes  Recent Labs Lab 10/04/2015 1452 10/06/2015 2127 09/07/15 0253  TROPONINI 0.05* 0.05* 0.05*    Glucose No results for input(s): GLUCAP in the last 168 hours.  Imaging Ct Chest Wo Contrast  09/07/2015  CLINICAL DATA:  Marked chest pain last night. History of squamous cell carcinoma of the lung, radiation pneumonitis, ascending thoracic aortic aneurysm, COPD and smoking. EXAM: CT CHEST WITHOUT CONTRAST TECHNIQUE: Multidetector CT imaging of the chest was  performed following the standard protocol without IV contrast. COMPARISON:  Chest radiographs obtained yesterday. Chest CT dated 08/04/2015. FINDINGS: Mediastinum/Lymph Nodes: Atheromatous arterial calcifications, including dense coronary artery calcifications. No enlarged lymph nodes. Lungs/Pleura: Interval approximately 15% left anterior pneumothorax. Resolution of the previously seen right pneumothorax. Minimal bilateral pleural fluid. Extensive bullous changes in both lungs, most pronounced in the upper lobes. The previously demonstrated irregular right suprahilar mass currently measures 3.4 x 1.8 cm on image number 21 and previously measured 4.5 x 2.0 cm in corresponding dimensions. Interval extensive patchy density in the inferior right lower lobe with multiple small cystic spaces or bullous changes. Similar changes in the right upper lobe are less prominent. These changes are obscuring the previously noted 6 mm right lower lobe nodule. Smaller amount of interval similar changes in the medial aspect of the left lower lobe. The previously noted 8 mm right upper lobe nodule measures 9 mm on image number 22. Small left upper lobe calcified granuloma. An 8 mm nodule in the left lower lobe on image number 41 is unchanged. A 5 mm nodule in the left lower lobe on image number 36 is also unchanged. Upper abdomen: Bilateral renal calculi are again demonstrated. A left renal upper pole cyst is again demonstrated. Cholecystectomy clips. Musculoskeletal: Thoracic spine degenerative changes. IMPRESSION: 1. Approximately 15% left pneumothorax. 2. Interval mild decrease in size of the spiculated right suprahilar mass. 3. No significant change in visualized bilateral lung nodules. 4. Extensive changes of COPD with centrilobular emphysema. 5. Interval probable bilateral lower lobe pneumonitis, greater on the right. 6. Bilateral renal calculi. Critical Value/emergent results were called by telephone at the time of  interpretation on 09/07/2015 at 8:34 pm to [facility], the patient's nurse, who verbally acknowledged these results. Electronically Signed   By: Claudie Revering M.D.   On: 09/07/2015 20:36     STUDIES:  CT Chest W/O 12/2:  Small anterior left pneumothorax. Changes in right lung consistent with prior radiation pneumonitis.  CULTURES: None  ANTIBIOTICS: None  SIGNIFICANT EVENTS: 12/01- Admit to Hospital 12/02- New Left Ptx 12/03- PCCM consulted & transfer to ICU  LINES/TUBES: PIV  ASSESSMENT / PLAN:  PULMONARY A: Small Left Pneumothorax - Spontaneous. COPD Exacerbation Stage IIIa NSCLC - S/P Treatment & diagnosed 11/2014. Chronic Hypoxic Respiratory Failure - 3 L/m at home. R-sided Radiation Pneumonitis H/O Right Pneumothorax 07/2015  P:  DNR/DNI DuoNeb every 4 hours Solu-Medrol 40 mg IV every 6 hours Repeat CT scan of the chest without contrast stat  Continuous Pulse Ox  CARDIOVASCULAR A:  Chronic Atrial Fibrillation Coronary Artery Disease  P:  Cardiology following Monitoring on telemetry Continuing Diltiazem q12hr Continuing Digoxin Holding anticoagulation given potential need for chest tube  RENAL A:   Acute Renal Failure  P:   Monitoring UOP Trending renal function daily with BUN/Creatinine  GASTROINTESTINAL A:   No Acute Issues  P:   NPO except meds  HEMATOLOGIC A:   Anemia - No signs of active bleeding. Systemic Anticoagulation - On hold. Leukocytosis - Likely stress response.  P:  Trending Hgb daily w/ CBC Holding systemic anticoagulation Trending leukocyte count daily with CBC Place SCDs tomorrow  INFECTIOUS A:   No acute process.  P:   Monitor for fever & trend WBC  ENDOCRINE A:   Chronic Prednisone Use H/O Hyperthyroidism - TSH 0.379 12/1  P:   Monitor blood glucose on daily BMP  NEUROLOGIC A:   Acute Delirium  P:   Hold sedating medications Monitor closely in ICU   FAMILY  - Updates: Patient's 2 daughters  updated at bedside by Dr. Ashok Cordia.  - Inter-disciplinary family meet or Palliative Care meeting due by: 12/10  TODAY'S SUMMARY:  81 year old male with Stage IIIa NSCLC of the right lung s/p chemo & XRT with radiation pneumonitis. Patient has a small acute left pneumothorax. I suspect his mild delirium is secondary to some element of hypercarbia from his COPD exacerbation. Initiating treatment of his underlying COPD exacerbation while reassessing size of his left spontaneous pneumothorax. Patient's daughters at bedside confirms the patient is DO NOT RESUSCITATE/DO NOT INTUBATE. I'm avoiding noninvasive positive pressure ventilation due to the potential to worsen the patient's spontaneous left pneumothorax. With the patient's systemic anticoagulation I believe that a surgical chest tube would need to be placed for evacuation of pneumothorax and control of potential bleeding should it become necessary.   I have spent a total of 32 minutes of critical care time today caring for the patient, updating his daughters at bedside, & reviewing his electronic medical record.  Sonia Baller Ashok Cordia, M.D. [facility] Pulmonary & Critical Care Pager:  [phone] After 3pm or if no response, call [phone] 09/08/2015, 1:06 PM

## 2015-09-09 ENCOUNTER — Inpatient Hospital Stay (HOSPITAL_COMMUNITY): Payer: Medicare Other

## 2015-09-09 DIAGNOSIS — J9621 Acute and chronic respiratory failure with hypoxia: Secondary | ICD-10-CM

## 2015-09-09 LAB — CBC
HEMATOCRIT: 32.5 % — AB (ref 39.0–52.0)
Hemoglobin: 9.9 g/dL — ABNORMAL LOW (ref 13.0–17.0)
MCH: 26.1 pg (ref 26.0–34.0)
MCHC: 30.5 g/dL (ref 30.0–36.0)
MCV: 85.8 fL (ref 78.0–100.0)
Platelets: 127 10*3/uL — ABNORMAL LOW (ref 150–400)
RBC: 3.79 MIL/uL — AB (ref 4.22–5.81)
RDW: 17.9 % — ABNORMAL HIGH (ref 11.5–15.5)
WBC: 9.4 10*3/uL (ref 4.0–10.5)

## 2015-09-09 MED ORDER — FUROSEMIDE 10 MG/ML IJ SOLN
40.0000 mg | Freq: Two times a day (BID) | INTRAMUSCULAR | Status: DC
  Administered 2015-09-09 – 2015-09-11 (×4): 40 mg via INTRAVENOUS
  Filled 2015-09-09 (×6): qty 4

## 2015-09-09 MED ORDER — WHITE PETROLATUM GEL
Status: AC
  Administered 2015-09-09: 0.2
  Filled 2015-09-09: qty 1

## 2015-09-09 MED ORDER — FUROSEMIDE 10 MG/ML IJ SOLN
20.0000 mg | Freq: Once | INTRAMUSCULAR | Status: AC
  Administered 2015-09-09: 20 mg via INTRAVENOUS

## 2015-09-09 MED ORDER — DM-GUAIFENESIN ER 30-600 MG PO TB12
2.0000 | ORAL_TABLET | Freq: Two times a day (BID) | ORAL | Status: DC
  Administered 2015-09-09 – 2015-09-18 (×19): 2 via ORAL
  Filled 2015-09-09 (×7): qty 2
  Filled 2015-09-09: qty 1
  Filled 2015-09-09 (×14): qty 2

## 2015-09-09 MED ORDER — ZOLPIDEM TARTRATE 5 MG PO TABS
5.0000 mg | ORAL_TABLET | Freq: Every evening | ORAL | Status: DC | PRN
  Administered 2015-09-09 – 2015-09-17 (×8): 5 mg via ORAL
  Filled 2015-09-09 (×9): qty 1

## 2015-09-09 MED ORDER — PANTOPRAZOLE SODIUM 40 MG IV SOLR
40.0000 mg | INTRAVENOUS | Status: DC
  Administered 2015-09-09: 40 mg via INTRAVENOUS
  Filled 2015-09-09: qty 40

## 2015-09-09 NOTE — Care Management Important Message (Signed)
Important Message  Patient Details  Name: [name] MRN: [id number] Date of Birth: 05/16/1933   Medicare Important Message Given:  Yes    Jasun Gasparini [place] 09/09/2015, 11:17 AM

## 2015-09-09 NOTE — Progress Notes (Signed)
PULMONARY / CRITICAL CARE MEDICINE   Name: [name] MRN: [id number] DOB: 22-Mar-1933    ADMISSION DATE:  09/26/2015 CONSULTATION DATE:  09/08/2015  REFERRING MD:  Hospitalist Service  CHIEF COMPLAINT:  Dyspnea & Left Pneumothorax   SUBJECTIVE: Patient reports worsening dyspnea overnight. Intermittent coughing but still nonproductive. No chest pain or pressure.  REVIEW OF SYSTEMS:  No fever or chills. No nausea or vomiting.  VITAL SIGNS: BP 119/73 mmHg  Pulse 84  Temp(Src) 98.6 F (37 C) (Oral)  Resp 23  Ht '5\' 10"'$  (1.778 m)  Wt 179 lb 3.7 oz (81.3 kg)  BMI 25.72 kg/m2  SpO2 97%  HEMODYNAMICS:    VENTILATOR SETTINGS:    INTAKE / OUTPUT: I/O last 3 completed shifts: In: 790 [P.O.:240; IV Piggyback:550] Out: 2000 [Urine:2000]  PHYSICAL EXAMINATION: General:  Awake. Alert. Watching TV.  Integument:  Warm & dry. No rash on exposed skin. Bruising of various ages on bilateral upper extremities. HEENT:  Tacky mucus membranes. No scleral injection or icterus.  Cardiovascular:  Regular rate. No edema. No appreciable JVD.  Pulmonary:  Coarse breath sounds bilaterally. Symmetric chest wall expansion. Mildly increased work of breathing on nasal cannula oxygen. Abdomen: Soft. Normal bowel sounds. Nondistended. Grossly nontender. Neurological: Oriented to season, year, and place. Following commands and moving all 4 extremities.  LABS:  BMET  Recent Labs Lab 09/12/2015 0940 09/08/15 0355 09/08/15 1550  NA 135 135 136  K 3.7 4.5 4.4  CL 98* 101 102  CO2 '26 25 26  '$ BUN 28* 38* 36*  CREATININE 1.44* 1.55* 1.42*  GLUCOSE 113* 111* 101*    Electrolytes  Recent Labs Lab 09/08/2015 0940 09/08/15 0355 09/08/15 1550  CALCIUM 9.2 9.1 8.7*  PHOS  --   --  4.3    CBC  Recent Labs Lab 09/07/15 0253 09/08/15 0930 09/09/15 0215  WBC 8.9 13.0* 9.4  HGB 10.1* 10.3* 9.9*  HCT 32.5* 34.7* 32.5*  PLT 121* 132* 127*    Coag's  Recent Labs Lab 09/07/15 0253  09/07/15 1117 09/08/15 1550  APTT 114* 154* 41*  INR  --   --  2.22*    Sepsis Markers No results for input(s): LATICACIDVEN, PROCALCITON, O2SATVEN in the last 168 hours.  ABG No results for input(s): PHART, PCO2ART, PO2ART in the last 168 hours.  Liver Enzymes  Recent Labs Lab 09/08/15 0930 09/08/15 1550  ALBUMIN 2.6* 2.5*    Cardiac Enzymes  Recent Labs Lab 09/29/2015 1452 09/21/2015 2127 09/07/15 0253  TROPONINI 0.05* 0.05* 0.05*    Glucose  Recent Labs Lab 09/08/15 1549  GLUCAP 92    Imaging Dg Chest Port 1 View  09/09/2015  CLINICAL DATA:  Followup earlier chest film. Evaluate left basilar pneumothorax. EXAM: PORTABLE CHEST 1 VIEW COMPARISON:  Earlier film, same date and prior chest CT 09/08/2015 FINDINGS: The cardiac silhouette, mediastinal and hilar contours are stable. Persistent significant airspace process in the right lung, likely pneumonia with underlying severe emphysema. A left-sided pneumothorax is not demonstrated for certain on the chest x-ray. IMPRESSION: Persistent significant airspace process in the right lung which is likely pneumonia. Stable underlying emphysema and pulmonary scarring. No definite left-sided pneumothorax. Electronically Signed   By: Marijo Sanes M.D.   On: 09/09/2015 17:26   Dg Chest Port 1 View  09/09/2015  CLINICAL DATA:  Pneumohemothorax. History of COPD. Chronic respiratory failure. Stage III carcinoma of the lung. Radiation pneumonitis. Left pneumothorax described on previous CT. EXAM: PORTABLE CHEST 1 VIEW COMPARISON:  Chest  CT dated 09/08/2015, chest CT dated 09/07/2015 and chest x-ray dated 10/03/2015. FINDINGS: The previous chest CT showed a pneumothorax at the left lung base. Left lung base is excluded on this chest x-ray. Suspect some degree of persistent pneumothorax overlying the medial aspects of the left hemidiaphragm, partially imaged. The airspace opacities within the right lung appear essentially unchanged compared to  the recent chest CTs, increased compared to the earlier chest x-ray, compatible with pneumonia superimposed on chronic underlying fibrosis and emphysema. Left upper lobe remains relatively clear. Cardiomediastinal silhouette is stable in size and configuration. Osseous structures are unremarkable. IMPRESSION: 1. Previous chest CTs showed a a small pneumothorax at the left lung base, estimated at 10-15%. This area is excluded on today's chest x-ray. Recommend repeat chest x-ray with complete coverage of the left lung base. 2. Persistent airspace opacities within the right lung, unchanged compared the recent chest CTs, increased in the right mid lung region compared to previous chest x-rays, compatible with pneumonia superimposed on chronic fibrosis and emphysema. These results will be called to the ordering clinician or representative by the Radiologist Assistant, and communication documented in the PACS or zVision Dashboard. Electronically Signed   By: Franki Cabot M.D.   On: 09/09/2015 15:58     STUDIES:  CT Chest W/O 12/2:  Small anterior left pneumothorax. Changes in right lung consistent with prior radiation pneumonitis. CT Chest W/O 12/3:  Slight reduction in size of small left anterior pneumothorax. Mild consolidation RLL Port CXR  12/4:  No obvious pneumothorax. RLL opacity persists.  CULTURES: Sputum Ctx (not yet obtained)  ANTIBIOTICS: Vancomycin 12/3>>> Zosyn 12/3>>>  SIGNIFICANT EVENTS: 12/01- Admit to Hospital 12/02- New Left Ptx 12/03- PCCM consulted & transfer to ICU  LINES/TUBES: PIV  ASSESSMENT / PLAN:  PULMONARY A: Small Left Pneumothorax - Spontaneous. Stable on Repeat CT 12/3. Acute on Chronic Hypoxic Respiratory Failure - 3 L/m at home. COPD Exacerbation Stage IIIa NSCLC - S/P Treatment & diagnosed 11/2014. R-sided Radiation Pneumonitis H/O Right Pneumothorax 07/2015  P:   DNR/DNI DuoNeb every 4 hours Solu-Medrol 40 mg IV every 6 hours Continuous Pulse  Ox  CARDIOVASCULAR A:  Chronic Atrial Fibrillation Coronary Artery Disease  P:  Cardiology following Monitoring on telemetry Continuing Diltiazem q12hr Continuing Digoxin Holding anticoagulation given potential need for chest tube Continuing Lasix BID Lasix '20mg'$  IV x1 this morning  RENAL A:   Acute Renal Failure - Mild.  P:   Monitoring UOP Trending renal function daily with BUN/Creatinine  GASTROINTESTINAL A:   No Acute Issues  P:   Regular diet  HEMATOLOGIC A:   Anemia - No signs of active bleeding. Systemic Anticoagulation - On hold. Leukocytosis - Likely stress response.  P:  Trending Hgb daily w/ CBC Holding systemic anticoagulation Trending leukocyte count daily with CBC Place SCDs when INR <2.0  INFECTIOUS A:   RLL HCAP  P:   Vancomcyin & Zosyn Day #2 Cultures Pending  ENDOCRINE A:   Chronic Prednisone Use H/O Hyperthyroidism - TSH 0.379 12/1  P:   Monitor blood glucose on daily BMP  NEUROLOGIC A:   Acute Delirium - Improving.  P:   Hold sedating medications Monitor closely in ICU   FAMILY  - Updates: Patient's 2 daughters updated at bedside by Dr. Ashok Cordia 12/3.  - Inter-disciplinary family meet or Palliative Care meeting due by: 12/10  TODAY'S SUMMARY:  81 year old male with Stage IIIa NSCLC of the right lung s/p chemo & XRT with radiation pneumonitis. Patient has  a small acute left pneumothorax. I suspect his mild delirium is secondary to some element of hypercarbia from his COPD exacerbation. His mental status is improving somewhat today. Certainly right lower lobe pneumonia is a possibility although I question whether or not this could be residual from his prior chest tube. His respiratory status is slowly worsening without evidence of worsening of his left pneumothorax.   I have spent a total of 31 minutes of critical care time today caring for the patient & reviewing his electronic medical record.  Sonia Baller Ashok Cordia,  M.D. [facility] Pulmonary & Critical Care Pager:  [phone] After 3pm or if no response, call [phone] 09/09/2015, 9:04 PM

## 2015-09-09 NOTE — Progress Notes (Signed)
eLink Physician-Brief Progress Note Patient Name: [name] DOB: July 08, 1933 MRN: [id number]   Date of Service  09/09/2015  HPI/Events of Note  Resp distress. CXR unchanged.  eICU Interventions  Check ABG and place on HFNC. DNR/DNI     Intervention Category Intermediate Interventions: Respiratory distress - evaluation and management  Celena Lanius 09/09/2015, 6:27 PM

## 2015-09-09 NOTE — Progress Notes (Signed)
eLink Physician-Brief Progress Note Patient Name: MAJESTY OEHLERT DOB: [birth year] MRN: [id number]   Date of Service  09/09/2015  HPI/Events of Note  Requests home ambien  eICU Interventions  Request granted     Intervention Category Minor Interventions: Routine modifications to care plan (e.g. PRN medications for pain, fever)  Simonne Maffucci 09/09/2015, 11:17 PM

## 2015-09-09 NOTE — Progress Notes (Signed)
Subjective:  Moved to ICU yest.  Breathing is better but still SOB.  Pleuritic chest pain resolved  Objective:  Vital Signs in the last 24 hours: BP 111/60 mmHg  Pulse 85  Temp(Src) 98.3 F (36.8 C) (Oral)  Resp 27  Ht '5\' 10"'$  (1.778 m)  Wt 81.3 kg (179 lb 3.7 oz)  BMI 25.72 kg/m2  SpO2 90%  Physical Exam: Elderly male obese currently in NAD Lungs: Reduced breath sounds on the left side.  Rhonchi bilaterally.   Cardiac:  Irregular rhythm, normal S1 and S2, no S3, somewhat distant heart sounds Abdomen:  Soft, nontender, no masses Extremities:  1+ edema present  Intake/Output from previous day: 12/03 0701 - 12/04 0700 In: 590 [P.O.:240; IV Piggyback:350] Out: 1300 [Urine:1300] Weight Filed Weights   10/02/2015 1000 09/08/15 1500  Weight: 83.915 kg (185 lb) 81.3 kg (179 lb 3.7 oz)    Lab Results: Basic Metabolic Panel:  Recent Labs  09/08/15 0355 09/08/15 1550  NA 135 136  K 4.5 4.4  CL 101 102  CO2 25 26  GLUCOSE 111* 101*  BUN 38* 36*  CREATININE 1.55* 1.42*    CBC:  Recent Labs  09/08/15 0930 09/09/15 0215  WBC 13.0* 9.4  HGB 10.3* 9.9*  HCT 34.7* 32.5*  MCV 87.2 85.8  PLT 132* 127*    BNP    Component Value Date/Time   BNP 157.9* 09/08/2015 1352    PROTIME: Lab Results  Component Value Date   INR 2.22* 09/08/2015   INR 1.78* 05/16/2015   INR 1.13 11/25/2014    Telemetry: Atrial fibrillation   Assessment/Plan:  1.  Left pnuemothorax 2.  Possible pericarditis although it is more likely that the pleuritic pain could be due to the pneumothorax. Pain better 3. Chronic atrial fib 4. Lung cancer with prior chemo radiation  Recommendations:  Looks better today.  Less SOB and pneumothorax is being observed. On antibiotics.       Kerry Hough  MD [facility]  09/09/2015, 2:31 PM

## 2015-09-09 NOTE — Progress Notes (Signed)
Pt placed on HF [place] at 30%, 20LPM, Pt looked comfortable but he said he could not tolerate it. [place] Doctor notified.

## 2015-09-10 ENCOUNTER — Inpatient Hospital Stay (HOSPITAL_COMMUNITY): Payer: Medicare Other

## 2015-09-10 DIAGNOSIS — Z87891 Personal history of nicotine dependence: Secondary | ICD-10-CM

## 2015-09-10 LAB — CBC WITH DIFFERENTIAL/PLATELET
BASOS PCT: 0 %
Basophils Absolute: 0 10*3/uL (ref 0.0–0.1)
Eosinophils Absolute: 0 10*3/uL (ref 0.0–0.7)
Eosinophils Relative: 0 %
HCT: 31.7 % — ABNORMAL LOW (ref 39.0–52.0)
HEMOGLOBIN: 9.7 g/dL — AB (ref 13.0–17.0)
Lymphocytes Relative: 2 %
Lymphs Abs: 0.3 10*3/uL — ABNORMAL LOW (ref 0.7–4.0)
MCH: 26.3 pg (ref 26.0–34.0)
MCHC: 30.6 g/dL (ref 30.0–36.0)
MCV: 85.9 fL (ref 78.0–100.0)
MONO ABS: 0.3 10*3/uL (ref 0.1–1.0)
Monocytes Relative: 3 %
NEUTROS ABS: 12.5 10*3/uL — AB (ref 1.7–7.7)
NEUTROS PCT: 95 %
PLATELETS: 132 10*3/uL — AB (ref 150–400)
RBC: 3.69 MIL/uL — AB (ref 4.22–5.81)
RDW: 18 % — ABNORMAL HIGH (ref 11.5–15.5)
WBC: 13.2 10*3/uL — ABNORMAL HIGH (ref 4.0–10.5)

## 2015-09-10 LAB — RENAL FUNCTION PANEL
ALBUMIN: 2.5 g/dL — AB (ref 3.5–5.0)
Anion gap: 8 (ref 5–15)
BUN: 35 mg/dL — AB (ref 6–20)
CALCIUM: 8.6 mg/dL — AB (ref 8.9–10.3)
CO2: 32 mmol/L (ref 22–32)
Chloride: 98 mmol/L — ABNORMAL LOW (ref 101–111)
Creatinine, Ser: 1.22 mg/dL (ref 0.61–1.24)
GFR calc Af Amer: >60 mL/min (ref >60)
GFR, EST NON AFRICAN AMERICAN: 54 mL/min — AB (ref >60)
GLUCOSE: 157 mg/dL — AB (ref 65–99)
PHOSPHORUS: 3.7 mg/dL (ref 2.5–4.6)
POTASSIUM: 3.4 mmol/L — AB (ref 3.5–5.1)
SODIUM: 138 mmol/L (ref 135–145)

## 2015-09-10 LAB — PROTIME-INR
INR: 1.31 (ref 0.00–1.49)
PROTHROMBIN TIME: 16.4 s — AB (ref 11.6–15.2)

## 2015-09-10 LAB — MAGNESIUM: Magnesium: 2 mg/dL (ref 1.7–2.4)

## 2015-09-10 LAB — APTT: aPTT: 28 seconds (ref 24–37)

## 2015-09-10 MED ORDER — POTASSIUM CHLORIDE CRYS ER 20 MEQ PO TBCR
20.0000 meq | EXTENDED_RELEASE_TABLET | ORAL | Status: AC
  Administered 2015-09-10 (×2): 20 meq via ORAL
  Filled 2015-09-10 (×2): qty 1

## 2015-09-10 MED ORDER — NICOTINE 7 MG/24HR TD PT24
7.0000 mg | MEDICATED_PATCH | Freq: Every day | TRANSDERMAL | Status: DC
  Administered 2015-09-10 – 2015-09-11 (×2): 7 mg via TRANSDERMAL
  Filled 2015-09-10 (×5): qty 1

## 2015-09-10 MED ORDER — PANTOPRAZOLE SODIUM 40 MG PO TBEC
40.0000 mg | DELAYED_RELEASE_TABLET | Freq: Every day | ORAL | Status: DC
  Administered 2015-09-10 – 2015-09-17 (×8): 40 mg via ORAL
  Filled 2015-09-10 (×8): qty 1

## 2015-09-10 NOTE — Plan of Care (Signed)
I have received consult and plan to meet with [name] and his family 12/6 1000 am to discuss goals of care. Thank you for this consult.  Vinie Sill, NP Palliative Medicine Team Pager # [phone] (M-F 8a-5p) Team Phone # [phone] (Nights/Weekends)

## 2015-09-10 NOTE — Progress Notes (Signed)
Patient currently not wearing high flow nasal cannula at this time.  Currently wearing 6L nasal cannula and tolerating well.  Will continue to monitor patient.

## 2015-09-10 NOTE — Progress Notes (Signed)
[facility] ADULT ICU REPLACEMENT PROTOCOL FOR AM LAB REPLACEMENT ONLY  The patient does apply for the [facility] Adult ICU Electrolyte Replacment Protocol based on the criteria listed below:   1. Is GFR >/= 40 ml/min? Yes.    Patient's GFR today is 54 2. Is urine output >/= 0.5 ml/kg/hr for the last 6 hours? Yes.   Patient's UOP is 0.1 ml/kg/hr 3. Is BUN < 60 mg/dL? Yes.    Patient's BUN today is 35 4. Abnormal electrolyte(s): K 3.4 5. Ordered repletion with: per protocol 6. If a panic level lab has been reported, has the CCM MD in charge been notified? No..   Physician:    Ronda Fairly A 09/10/2015 5:49 AM

## 2015-09-10 NOTE — Progress Notes (Signed)
PULMONARY / CRITICAL CARE MEDICINE   Name: [name] MRN: [id number] DOB: 03/30/1933    ADMISSION DATE:  09/22/2015 CONSULTATION DATE:  09/08/2015  REFERRING MD:  Hospitalist Service  CHIEF COMPLAINT:  Dyspnea & Left Pneumothorax  SUBJECTIVE: Patient continues to have cough intermittently productive of hemoptysis. Dyspnea has improved somewhat since chest today. Continuing to have left-sided pleuritic type chest pain. No other new chest pain or pressure.  REVIEW OF SYSTEMS:  Denies any abdominal pain, nausea, or vomiting. No subjective fever, chills, or sweats.  VITAL SIGNS: BP 104/59 mmHg  Pulse 76  Temp(Src) 97.7 F (36.5 C) (Oral)  Resp 24  Ht '5\' 10"'$  (1.778 m)  Wt 179 lb 3.7 oz (81.3 kg)  BMI 25.72 kg/m2  SpO2 97%  HEMODYNAMICS:    VENTILATOR SETTINGS:    INTAKE / OUTPUT: I/O last 3 completed shifts: In: 800 [IV Piggyback:800] Out: 2400 [Urine:2400]  PHYSICAL EXAMINATION: General:  Daughters at bedside. No distress. Comfortable laying in bed watching TV. Integument:  Warm & dry. No rash on exposed skin. Bruising of various ages on bilateral upper extremities. HEENT:  Moist mucus membranes. No scleral injection or icterus.  Cardiovascular:  Regular rate. Trace lower extremity edema. No appreciable JVD.  Pulmonary:  Coarse breath sounds bilaterally but somewhat improved since yesterday. Normal work of breathing on nasal cannula. Speaking in complete sentences. Abdomen: Soft. Normal bowel sounds. Nondistended. Grossly nontender. Neurological: Oriented to season, year, and place. Following commands and moving all 4 extremities.  LABS:  BMET  Recent Labs Lab 09/08/15 0355 09/08/15 1550 09/10/15 0205  NA 135 136 138  K 4.5 4.4 3.4*  CL 101 102 98*  CO2 25 26 32  BUN 38* 36* 35*  CREATININE 1.55* 1.42* 1.22  GLUCOSE 111* 101* 157*    Electrolytes  Recent Labs Lab 09/08/15 0355 09/08/15 1550 09/10/15 0205  CALCIUM 9.1 8.7* 8.6*  MG  --   --   2.0  PHOS  --  4.3 3.7    CBC  Recent Labs Lab 09/08/15 0930 09/09/15 0215 09/10/15 0205  WBC 13.0* 9.4 13.2*  HGB 10.3* 9.9* 9.7*  HCT 34.7* 32.5* 31.7*  PLT 132* 127* 132*    Coag's  Recent Labs Lab 09/07/15 1117 09/08/15 1550 09/10/15 0205  APTT 154* 41* 28  INR  --  2.22* 1.31    Sepsis Markers No results for input(s): LATICACIDVEN, PROCALCITON, O2SATVEN in the last 168 hours.  ABG No results for input(s): PHART, PCO2ART, PO2ART in the last 168 hours.  Liver Enzymes  Recent Labs Lab 09/08/15 0930 09/08/15 1550 09/10/15 0205  ALBUMIN 2.6* 2.5* 2.5*    Cardiac Enzymes  Recent Labs Lab 09/30/2015 1452 09/20/2015 2127 09/07/15 0253  TROPONINI 0.05* 0.05* 0.05*    Glucose  Recent Labs Lab 09/08/15 1549  GLUCAP 92    Imaging Dg Chest Port 1 View  09/10/2015  CLINICAL DATA:  Left-sided pneumothorax. EXAM: PORTABLE CHEST 1 VIEW COMPARISON:  09/09/2015 FINDINGS: Mild cardiac enlargement. Aortic atherosclerosis is identified. There is extensive fibrosis, scarring and volume loss involving the right lung. When compared with 09/09/2015 the appearance is unchanged. Left lung is relatively clear. No pneumothorax is visualized. IMPRESSION: 1. No change in the appearance of the right lung compared with 09/09/2015. 2. Left pneumothorax not visualized at this time. Electronically Signed   By: Kerby Moors M.D.   On: 09/10/2015 14:09   Dg Chest Port 1 View  09/09/2015  CLINICAL DATA:  Followup earlier chest film. Evaluate  left basilar pneumothorax. EXAM: PORTABLE CHEST 1 VIEW COMPARISON:  Earlier film, same date and prior chest CT 09/08/2015 FINDINGS: The cardiac silhouette, mediastinal and hilar contours are stable. Persistent significant airspace process in the right lung, likely pneumonia with underlying severe emphysema. A left-sided pneumothorax is not demonstrated for certain on the chest x-ray. IMPRESSION: Persistent significant airspace process in the right  lung which is likely pneumonia. Stable underlying emphysema and pulmonary scarring. No definite left-sided pneumothorax. Electronically Signed   By: Marijo Sanes M.D.   On: 09/09/2015 17:26     STUDIES:  CT Chest W/O 12/2:  Small anterior left pneumothorax. Changes in right lung consistent with prior radiation pneumonitis. CT Chest W/O 12/3:  Slight reduction in size of small left anterior pneumothorax. Mild consolidation RLL Port CXR  12/4:  No obvious pneumothorax. RLL opacity persists.  CULTURES: Sputum Ctx (not yet obtained)  ANTIBIOTICS: Vancomycin 12/3>>> Zosyn 12/3>>>  SIGNIFICANT EVENTS: 12/01- Admit to Hospital 12/02- New Left Ptx 12/03- PCCM consulted & transfer to ICU  LINES/TUBES: PIV  ASSESSMENT / PLAN:  PULMONARY A: Small Left Pneumothorax - Spontaneous. Stable on Repeat CT 12/3. Acute on Chronic Hypoxic Respiratory Failure - 3 L/m at home. Hemoptysis - Likely secondary to anticoagulation & COPD exacerbation vs Pneumothorax COPD Exacerbation Stage IIIa NSCLC - S/P Treatment & diagnosed 11/2014. R-sided Radiation Pneumonitis H/O Right Pneumothorax 07/2015  P:   DNR/DNI DuoNeb every 4 hours Solu-Medrol 40 mg IV every 6 hours Continuous Pulse Ox  CARDIOVASCULAR A:  Chronic Atrial Fibrillation Coronary Artery Disease  P:  Cardiology following Monitoring on telemetry Continuing Diltiazem q12hr Continuing Digoxin Holding anticoagulation given potential need for chest tube Continuing Lasix BID  RENAL A:   Acute Renal Failure - Resolved.  P:   Monitoring UOP Trending renal function daily with BUN/Creatinine  GASTROINTESTINAL A:   No Acute Issues  P:   Regular diet Protonix by mouth daily at bedtime  HEMATOLOGIC A:   Anemia - No signs of active bleeding. Systemic Anticoagulation - On hold. Leukocytosis - Likely stress response. Stable. Thrombocytopenia - Improving slowly.  P:  Trending Hgb & Platelets daily w/ CBC Holding systemic  anticoagulation Trending leukocyte count daily with CBC Place SCDs   INFECTIOUS A:   RLL HCAP vs Scar  P:   Vancomcyin & Zosyn Day #3 Cultures Pending Reordering Sputum Ctx  ENDOCRINE A:   Chronic Prednisone Use H/O Hyperthyroidism - TSH 0.379 12/1  P:   Monitor blood glucose on daily BMP  NEUROLOGIC A:   Acute Delirium - Improving. Nicotine Dependence - Chews snuff daily.  P:   Hold sedating medications Monitor closely in ICU Nicotine patch '7mg'$ /24hr Ultram PO prn pain  FAMILY  - Updates: Patient's 3 daughters updated at bedside by Dr. Ashok Cordia 12/5.  TODAY'S SUMMARY:  81 year old male with Stage IIIa NSCLC of the right lung s/p chemo & XRT with radiation pneumonitis. Patient has a small acute left pneumothorax. I suspect his mild delirium is secondary to some element of hypercarbia from his COPD exacerbation. Chest imaging with x-ray continues to demonstrate no pneumothorax. Patient's respiratory status seems somewhat improved today. Could have some element of nicotine withdrawal pointed out by his daughter thus am prescribing nicotine patch. Continue close monitoring in the intensive care unit and likely transition to a stepdown bed tomorrow if he remains stable.  I have spent a total of 42 minutes of critical care time today caring for the patient, contacting palliative medicine for consultation, updating his  3 daughters at bedside, & reviewing his electronic medical record.  Sonia Baller Ashok Cordia, M.D. [facility]monary [facility] Critical [facility] Pager:  [phone] After 3pm or if no response, call [phone] 09/10/2015, 4:23 PM

## 2015-09-10 NOTE — Progress Notes (Signed)
Subjective:  Says breathing better today.  Coughing up blood and still significant wheezes rhonchi  Objective:  Vital Signs in the last 24 hours: BP 98/59 mmHg  Pulse 100  Temp(Src) 97 F (36.1 C) (Oral)  Resp 22  Ht '5\' 10"'$  (1.778 m)  Wt 81.3 kg (179 lb 3.7 oz)  BMI 25.72 kg/m2  SpO2 94%  Physical Exam: Elderly male obese currently in NAD Lungs: Reduced breath sounds on the left side.  Rhonchi bilaterally.   Cardiac:  Irregular rhythm, normal S1 and S2, no S3, somewhat distant heart sounds Abdomen:  Soft, nontender, no masses Extremities:  1+ edema present  Intake/Output from previous day: 12/04 0701 - 12/05 0700 In: 450 [IV Piggyback:450] Out: 1500 [Urine:1500] Weight Filed Weights   09/09/2015 1000 09/08/15 1500  Weight: 83.915 kg (185 lb) 81.3 kg (179 lb 3.7 oz)    Lab Results: Basic Metabolic Panel:  Recent Labs  09/08/15 1550 09/10/15 0205  NA 136 138  K 4.4 3.4*  CL 102 98*  CO2 26 32  GLUCOSE 101* 157*  BUN 36* 35*  CREATININE 1.42* 1.22    CBC:  Recent Labs  09/09/15 0215 09/10/15 0205  WBC 9.4 13.2*  NEUTROABS  --  12.5*  HGB 9.9* 9.7*  HCT 32.5* 31.7*  MCV 85.8 85.9  PLT 127* 132*    BNP    Component Value Date/Time   BNP 157.9* 09/08/2015 1352    PROTIME: Lab Results  Component Value Date   INR 1.31 09/10/2015   INR 2.22* 09/08/2015   INR 1.78* 05/16/2015    Telemetry: Atrial fibrillation   Assessment/Plan:  1.  Left pneumothorax ? If worse or getting better 2.  Possible pericarditis although it is more likely that the pleuritic pain could be due to the pneumothorax. Pain better 3. Chronic atrial fib 4. Lung cancer with prior chemo radiation  Recommendations:  Discussed with Dr. Ashok Cordia and family.  He is DNR and does not want chemo but would be willing to have a chest tube if it would help.       Kerry Hough  MD [facility]  09/10/2015, 9:28 AM

## 2015-09-11 DIAGNOSIS — C3490 Malignant neoplasm of unspecified part of unspecified bronchus or lung: Secondary | ICD-10-CM | POA: Diagnosis present

## 2015-09-11 DIAGNOSIS — Z515 Encounter for palliative care: Secondary | ICD-10-CM | POA: Diagnosis present

## 2015-09-11 DIAGNOSIS — F17200 Nicotine dependence, unspecified, uncomplicated: Secondary | ICD-10-CM

## 2015-09-11 LAB — RENAL FUNCTION PANEL
ANION GAP: 7 (ref 5–15)
Albumin: 2.4 g/dL — ABNORMAL LOW (ref 3.5–5.0)
BUN: 40 mg/dL — ABNORMAL HIGH (ref 6–20)
CALCIUM: 8.7 mg/dL — AB (ref 8.9–10.3)
CHLORIDE: 99 mmol/L — AB (ref 101–111)
CO2: 35 mmol/L — AB (ref 22–32)
Creatinine, Ser: 1.26 mg/dL — ABNORMAL HIGH (ref 0.61–1.24)
GFR calc non Af Amer: 52 mL/min — ABNORMAL LOW (ref >60)
Glucose, Bld: 165 mg/dL — ABNORMAL HIGH (ref 65–99)
POTASSIUM: 3.7 mmol/L (ref 3.5–5.1)
Phosphorus: 3.1 mg/dL (ref 2.5–4.6)
SODIUM: 141 mmol/L (ref 135–145)

## 2015-09-11 LAB — CBC WITH DIFFERENTIAL/PLATELET
Basophils Absolute: 0 10*3/uL (ref 0.0–0.1)
Basophils Relative: 0 %
EOS ABS: 0 10*3/uL (ref 0.0–0.7)
EOS PCT: 0 %
HCT: 32.6 % — ABNORMAL LOW (ref 39.0–52.0)
Hemoglobin: 9.7 g/dL — ABNORMAL LOW (ref 13.0–17.0)
LYMPHS ABS: 0.3 10*3/uL — AB (ref 0.7–4.0)
LYMPHS PCT: 2 %
MCH: 26.2 pg (ref 26.0–34.0)
MCHC: 29.8 g/dL — AB (ref 30.0–36.0)
MCV: 88.1 fL (ref 78.0–100.0)
MONO ABS: 0.3 10*3/uL (ref 0.1–1.0)
MONOS PCT: 2 %
Neutro Abs: 10.7 10*3/uL — ABNORMAL HIGH (ref 1.7–7.7)
Neutrophils Relative %: 95 %
PLATELETS: 125 10*3/uL — AB (ref 150–400)
RBC: 3.7 MIL/uL — ABNORMAL LOW (ref 4.22–5.81)
RDW: 18.1 % — AB (ref 11.5–15.5)
WBC: 11.2 10*3/uL — ABNORMAL HIGH (ref 4.0–10.5)

## 2015-09-11 LAB — PROTIME-INR
INR: 1.11 (ref 0.00–1.49)
Prothrombin Time: 14.5 seconds (ref 11.6–15.2)

## 2015-09-11 LAB — APTT: aPTT: 25 seconds (ref 24–37)

## 2015-09-11 LAB — MAGNESIUM: Magnesium: 2.1 mg/dL (ref 1.7–2.4)

## 2015-09-11 MED ORDER — POLYETHYLENE GLYCOL 3350 17 G PO PACK
17.0000 g | PACK | Freq: Every day | ORAL | Status: DC
  Administered 2015-09-11: 17 g via ORAL
  Filled 2015-09-11: qty 1

## 2015-09-11 MED ORDER — IPRATROPIUM-ALBUTEROL 0.5-2.5 (3) MG/3ML IN SOLN
3.0000 mL | Freq: Four times a day (QID) | RESPIRATORY_TRACT | Status: DC
  Administered 2015-09-12 – 2015-09-18 (×27): 3 mL via RESPIRATORY_TRACT
  Filled 2015-09-11 (×28): qty 3

## 2015-09-11 NOTE — Consult Note (Signed)
Consultation Note Date: 09/11/2015   Patient Name: [name]  DOB: [birth year]  MRN: [id number]  Age / Sex: 81 y.o., male  PCP: [name], MD Referring Physician: Javier Glazier, MD  Reason for Consultation: Establishing goals of care  Mr. Mccaughey is 81 yo gentleman with stage IIIA NSCLC diagnosed 11/2014 and received radiation as well as last chemo given 02/12/15 with partial response. CT lungs show fairly stable nodules but persistent radiation pneumonitis in Rt lung and severe COPD. Small ~15% pneumothorax Lt lung. Progressive SOB at home with hemoptysis.   Clinical Assessment/Narrative: I met today with [name] along with his 2 daughters, granddaughter (main support - lives next door), and great granddaughter (she lives with him). Discussed overall health and goals. They have been pleased with his progress since his cancer except for he has been very debilitated since his last admission in October d/t leg swelling s/t steroids preventing him from participating in rehab/PT. Prior to this hospitalization he was cooking, getting out of house regularly, and even took a trip with family to [place] a few months ago. We discussed the damage from his radiation to his lungs in combination with worsening lung disease being a barrier for him. They have good understanding of his medical condition. They confirm DNR and he would NOT desire further treatment of his cancer if indicated but otherwise want life-prolonging interventions. Goal is to try and rehab him to be as independent and functional as possible to keep him in his home as long as possible and improve QOL. He wishes he could get out of the house more like he was a few months ago but is otherwise happy to be in his home with his dog and talk on his phone.   They say the past 2 years have been difficult with the passing of his wife, best friend, and daughter.  Spoke with family privately and they do not feel like he is at a place for hospice yet as goal is to optimize health - they did have hospice for his wife and said they are open to this but he has done very well until just recently. They say last admission he was discharged without ever getting therapy or out of bed. Will order PT to work with him as they may be open to short term rehab if indicated but preference is home. Will also order Miralax for constipation. Support provided. Will follow along.   Contacts/Participants in Discussion: Primary Decision Maker: Self and then daughters and granddaughter big part of his care    SUMMARY OF RECOMMENDATIONS - DNR - Goal is to optimize independence and functional status - Goal is to stay at home as long as possible (people in/out and nearby in daytime, always family present at nighttime)  Code Status/Advance Care Planning: DNR    Code Status Orders        Start     Ordered   09/24/2015 1242  Do not attempt resuscitation (DNR)   Continuous    Question Answer Comment  In the event of cardiac or respiratory ARREST Do not call a "code blue"   In the event of cardiac or respiratory ARREST Do not perform Intubation, CPR, defibrillation or ACLS   In the event of cardiac or respiratory ARREST Use medication by any route, position, wound care, and other measures to relive pain and suffering. May use oxygen, suction and manual treatment of airway obstruction as needed for comfort.      10/06/2015  1241       Symptom Management:   Constipation: Miralax ordered.   SOB: Per pulmonology. Continue oxygen.  Weakness: PT consult.  Bilat swelling in feet: Wishes to minimize steroids. Encouraged exercises in bed. TED hose.  Palliative Prophylaxis:   Bowel Regimen, Delirium Protocol and Oral Care  Additional Recommendations (Limitations, Scope, Preferences):  No Chemotherapy, DNR, otherwise full scope.   Psycho-social/Spiritual:  Support System:  Strong Desire for further Chaplaincy support:no Additional Recommendations: Caregiving  Support/Resources  Prognosis: Difficult to say but could likely be < 6 months with worsening respiratory disease from complications of treatment for lung cancer  Discharge Planning: Home with home health vs SNF short term rehab   Chief Complaint/ Primary Diagnoses: Present on Admission:  . Pleuritic chest pain . COPD (chronic obstructive pulmonary disease) ([place]) . Chronic respiratory failure with hypoxia ([place]) . Thoracic ascending aortic aneurysm ([place]) . CAD (coronary artery disease), native coronary artery . SCC (squamous cell carcinoma of lung) ([place]) . Chronic atrial fibrillation ([place]) . (Resolved) Abnormal TSH  I have reviewed the medical record, interviewed the patient and family, and examined the patient. The following aspects are pertinent.  Past Medical History  Diagnosis Date  . COPD (chronic obstructive pulmonary disease) ([place])   . Chronic atrial fibrillation ([place])   . GERD (gastroesophageal reflux disease)   . Coronary artery disease   . Kidney stone   . CAD (coronary artery disease), native coronary artery 12/03/2014  . Thoracic ascending aortic aneurysm ([place]) 12/03/2014    4.2 x 4.3 cm noted on CT scan February 2016   . Radiation 01/04/15-02/16/15    right upper chest/hilar region 60 Gy  . Chronic respiratory failure ([place]) 06/01/2015    - 05/31/2015   Walked 3lpm x one lap @ 185 stopped due to sob with sats still 94% slow pace    rx as of 05/31/2015 = 3lpm 24/7   . COPD 11/25/2014    - Spirometry 09/12/2005  FEV11.05 with ratio 27  - 06/15/2015 p extensive coaching HFA effectiveness =    90%      . Current use of long term anticoagulation   . History of tobacco abuse 11/25/2014  . Hyperthyroidism 06/01/2015  . SCC (squamous cell carcinoma of lung) ([place]) 11/29/2014    Stage IIIA (T3N1M0) - treated with concurrent chemoradiation with weekly carboplatin for AUC of 2 and paclitaxel 45 MG/M2.  Last dose 02/12/2015 with partial response  . Radiation pneumonitis ([place])   . Pneumothorax, right 08/04/2015  . On home oxygen therapy     "I sleep w/it q night; use it during the day if I need it; 3L" (09/18/2015)  . Macular degeneration of both eyes     "wet and dry'   Social History   Social History  . Marital Status: Widowed    Spouse Name: N/A  . Number of Children: 3  . Years of Education: N/A   Social History Main Topics  . Smoking status: Former Smoker -- 2.00 packs/day for 65 years    Types: Cigarettes    Quit date: 12/12/2009  . Smokeless tobacco: Never Used  . Alcohol Use: No     Comment: has not drank in 4-5 years.  Has a beer every once in awhile.  . Drug Use: No  . Sexual Activity: Not Asked   Other Topics Concern  . None   Social History Narrative   Family History  Problem Relation Age of Onset  . Stroke    . Cancer    .  Heart attack Sister   . Lung cancer Daughter     smokes   Scheduled Meds: . colchicine  0.6 mg Oral Daily  . dextromethorphan-guaiFENesin  2 tablet Oral BID  . digoxin  0.125 mg Oral Daily  . diltiazem  180 mg Oral Q12H  . furosemide  40 mg Intravenous BID  . ipratropium-albuterol  3 mL Nebulization Q4H  . methylPREDNISolone (SOLU-MEDROL) injection  40 mg Intravenous Q6H  . nicotine  7 mg Transdermal Daily  . pantoprazole  40 mg Oral QHS  . piperacillin-tazobactam (ZOSYN)  IV  3.375 g Intravenous Q8H  . triamcinolone cream   Topical TID  . vancomycin  750 mg Intravenous Q12H   Continuous Infusions:  PRN Meds:.acetaminophen, albuterol, ondansetron (ZOFRAN) IV, traMADol, zolpidem Medications Prior to Admission:  Prior to Admission medications   Medication Sig Start Date End Date Taking? Authorizing Provider  albuterol (PROAIR HFA) 108 (90 BASE) MCG/ACT inhaler Inhale 2 puffs into the lungs every 6 (six) hours as needed for wheezing or shortness of breath. 05/18/15  Yes Domenic Polite, MD  budesonide-formoterol [facility]) 160-4.5  MCG/ACT inhaler Inhale 2 puffs into the lungs 2 (two) times daily.   Yes Historical Provider, MD  dextromethorphan-guaiFENesin (MUCINEX DM) 30-600 MG per 12 hr tablet Take 1 tablet by mouth daily.   Yes Historical Provider, MD  digoxin (LANOXIN) 0.125 MG tablet Take 0.125 mg by mouth daily.   Yes Historical Provider, MD  diltiazem (CARDIZEM) 90 MG tablet Take 1 tablet (90 mg total) by mouth 4 (four) times daily. Patient taking differently: Take 180 mg by mouth 2 (two) times daily with a meal. Take 2 x 90 mg tablets twice daily before breakfast and supper. 08/11/15  Yes Marijean Heath, NP  furosemide (LASIX) 40 MG tablet Take 40 mg by mouth daily.   Yes Historical Provider, MD  potassium chloride SA (KLOR-CON M20) 20 MEQ tablet Take 20 mEq by mouth daily.    Yes Historical Provider, MD  predniSONE (DELTASONE) 10 MG tablet Take 10 mg by mouth daily with breakfast.   Yes Historical Provider, MD  rivaroxaban (XARELTO) 20 MG TABS tablet Take 20 mg by mouth daily with supper.   Yes Historical Provider, MD  tiotropium (SPIRIVA) 18 MCG inhalation capsule Place 1 capsule (18 mcg total) into inhaler and inhale daily. 07/31/15  Yes Collene Gobble, MD  traMADol (ULTRAM) 50 MG tablet Take 1 tablet (50 mg total) by mouth every 6 (six) hours as needed for moderate pain (cough or mild pain). 08/11/15  Yes Marijean Heath, NP  triamcinolone cream (KENALOG) 0.1 % apply to affected area daily as needed for skin irritation 02/26/15  Yes Historical Provider, MD  zolpidem (AMBIEN) 10 MG tablet Take 10 mg by mouth at bedtime as needed for sleep.    Yes Historical Provider, MD  digoxin (LANOXIN) 0.25 MG tablet 1 tablet by mouth daily for heart rate control Patient not taking: Reported on 09/26/2015 08/11/15   Marijean Heath, NP  predniSONE (DELTASONE) 20 MG tablet Take 1 tablet (20 mg total) by mouth daily with breakfast. Patient not taking: Reported on 09/12/2015 08/11/15   Marijean Heath, NP   Allergies    Allergen Reactions  . Codeine Itching    Review of Systems  Constitutional: Positive for appetite change.  Respiratory: Positive for shortness of breath.   Cardiovascular: Positive for leg swelling.  Neurological: Positive for weakness.    Physical Exam  Constitutional: He is oriented to person, place, and  time. He appears well-developed.  HENT:  Head: Normocephalic and atraumatic.  Cardiovascular: Normal rate.   Respiratory: Effort normal. No accessory muscle usage. No tachypnea. No respiratory distress.  Neurological: He is alert and oriented to person, place, and time.    Vital Signs: BP 109/63 mmHg  Pulse 79  Temp(Src) 97.5 F (36.4 C) (Oral)  Resp 20  Ht $R'5\' 10"'KN$  (1.778 m)  Wt 81.3 kg (179 lb 3.7 oz)  BMI 25.72 kg/m2  SpO2 100%  SpO2: SpO2: 100 % O2 Device:SpO2: 100 % O2 Flow Rate: .O2 Flow Rate (L/min): 6 L/min  IO: Intake/output summary:  Intake/Output Summary (Last 24 hours) at 09/11/15 0240 Last data filed at 09/11/15 0700  Gross per 24 hour  Intake   1035 ml  Output   1780 ml  Net   -745 ml    LBM: Last BM Date: 09/07/15 Baseline Weight: Weight: 83.915 kg (185 lb) (pt reported) Most recent weight: Weight: 81.3 kg (179 lb 3.7 oz)      Palliative Assessment/Data:  Flowsheet Rows        Most Recent Value   Intake Tab    Referral Department  Critical care   Unit at Time of Referral  ICU   Palliative Care Primary Diagnosis  Cancer   Date Notified  09/10/15   Palliative Care Type  New Palliative care   Reason for referral  Clarify Goals of Care   Date of Admission  09/25/2015   # of days IP prior to Palliative referral  4   Clinical Assessment    Psychosocial & Spiritual Assessment    Palliative Care Outcomes       Additional Data Reviewed:  CBC:    Component Value Date/Time   WBC 11.2* 09/11/2015 0228   WBC 10.3 06/25/2015 0941   HGB 9.7* 09/11/2015 0228   HGB 11.3* 06/25/2015 0941   HCT 32.6* 09/11/2015 0228   HCT 36.1* 06/25/2015 0941    PLT 125* 09/11/2015 0228   PLT 181 06/25/2015 0941   MCV 88.1 09/11/2015 0228   MCV 79.7 06/25/2015 0941   NEUTROABS 10.7* 09/11/2015 0228   NEUTROABS 9.1* 06/25/2015 0941   LYMPHSABS 0.3* 09/11/2015 0228   LYMPHSABS 0.5* 06/25/2015 0941   MONOABS 0.3 09/11/2015 0228   MONOABS 0.6 06/25/2015 0941   EOSABS 0.0 09/11/2015 0228   EOSABS 0.1 06/25/2015 0941   BASOSABS 0.0 09/11/2015 0228   BASOSABS 0.0 06/25/2015 0941   Comprehensive Metabolic Panel:    Component Value Date/Time   NA 141 09/11/2015 0228   NA 139 06/25/2015 0941   K 3.7 09/11/2015 0228   K 4.0 06/25/2015 0941   CL 99* 09/11/2015 0228   CO2 35* 09/11/2015 0228   CO2 29 06/25/2015 0941   BUN 40* 09/11/2015 0228   BUN 28.7* 06/25/2015 0941   CREATININE 1.26* 09/11/2015 0228   CREATININE 1.2 06/25/2015 0941   GLUCOSE 165* 09/11/2015 0228   GLUCOSE 96 06/25/2015 0941   CALCIUM 8.7* 09/11/2015 0228   CALCIUM 9.4 06/25/2015 0941   AST 12 06/25/2015 0941   AST 22 05/15/2015 1700   ALT 17 06/25/2015 0941   ALT 10* 05/15/2015 1700   ALKPHOS 71 06/25/2015 0941   ALKPHOS 92 05/15/2015 1700   BILITOT 0.32 06/25/2015 0941   BILITOT 0.5 05/15/2015 1700   PROT 6.4 06/25/2015 0941   PROT 6.5 05/15/2015 1700   ALBUMIN 2.4* 09/11/2015 0228   ALBUMIN 3.1* 06/25/2015 0941     Time In: 1000 Time Out:  1130 Time Total: 18min Greater than 50%  of this time was spent counseling and coordinating care related to the above assessment and plan.  Signed by: Pershing Proud, NP  Pershing Proud, NP  07/14/3234, 9:18 AM  Please contact Palliative Medicine Team phone at [phone] for questions and concerns.

## 2015-09-11 NOTE — Progress Notes (Signed)
Subjective:  Feeling better than yesterday.  No Pleuritic pain.  Hemoptysis has stopped  Objective:  Vital Signs in the last 24 hours: BP 109/63 mmHg  Pulse 79  Temp(Src) 97.5 F (36.4 C) (Oral)  Resp 20  Ht '5\' 10"'$  (1.778 m)  Wt 81.3 kg (179 lb 3.7 oz)  BMI 25.72 kg/m2  SpO2 100%  Physical Exam: Elderly male obese currently in NAD Skin: multiple petechiae and ecchymoses Lungs: Reduced breath sounds on the left side.  Rhonchi bilaterally.   Cardiac:  Irregular rhythm, normal S1 and S2, no S3, somewhat distant heart sounds Abdomen:  Soft, nontender, no masses Extremities:  1+ edema present  Intake/Output from previous day: 12/05 0701 - 12/06 0700 In: 5374 [P.O.:860; IV Piggyback:425] Out: 8270 [Urine:1780] Weight Filed Weights   09/07/2015 1000 09/08/15 1500  Weight: 83.915 kg (185 lb) 81.3 kg (179 lb 3.7 oz)    Lab Results: Basic Metabolic Panel:  Recent Labs  09/10/15 0205 09/11/15 0228  NA 138 141  K 3.4* 3.7  CL 98* 99*  CO2 32 35*  GLUCOSE 157* 165*  BUN 35* 40*  CREATININE 1.22 1.26*    CBC:  Recent Labs  09/10/15 0205 09/11/15 0228  WBC 13.2* 11.2*  NEUTROABS 12.5* 10.7*  HGB 9.7* 9.7*  HCT 31.7* 32.6*  MCV 85.9 88.1  PLT 132* 125*    BNP    Component Value Date/Time   BNP 157.9* 09/08/2015 1352    PROTIME: Lab Results  Component Value Date   INR 1.11 09/11/2015   INR 1.31 09/10/2015   INR 2.22* 09/08/2015    Telemetry: Atrial fibrillation with controlled response  Assessment/Plan:  1.  Left pneumothorax ?clinically stable 2.  Pleuritic chest pain likely due to pneumothorax.  Doubt this is pericarditis, but treating emprically 3. Chronic atrial fib 4. Lung cancer with prior chemo radiation 5. COPD with exacerbation  Recommendations:  Clinically better today but still significant rhonchi.  Pneumothorax being folllowed by pulmonary.        Kerry Hough  MD [facility]  09/11/2015, 8:52 AM

## 2015-09-11 NOTE — Evaluation (Signed)
Physical Therapy Evaluation Patient Details Name: [name] MRN: [id number] DOB: 07/06/1933 Today's Date: 09/11/2015   History of Present Illness  This is an 81 year old male patient known chronic hypoxemic respiratory failure secondary to COPD on 3 L oxygen, no non-small cell lung cancer who completed chemotherapy and radiation in May 2016, Recent admission for spontaneous pneumothorax on 11/5, atrial fibrillation on Xaralto, known thoracic ascending aortic aneurysm measuring 4.3 cm as of febrile 2016 CT, abnormal TSH and August 2016, history of radiation pneumonitis, known CAD and GERD.  Patient admitted with abrupt onset of chest pain.  Clinical Impression  Patient presents with decreased mobility due to deficits listed in PT problem list below.  He will benefit from skilled PT in the acute setting to allow return home (pt adamant about returning to home,) and maximize mobility and decrease burden of care.  Likely to need hospital bed, and (if he doesn't have one,) w/c for home.    Follow Up Recommendations Supervision/Assistance - 24 hour;Home health PT    Equipment Recommendations  Hospital bed    Recommendations for Other Services       Precautions / Restrictions Precautions Precautions: Fall      Mobility  Bed Mobility Overal bed mobility: Needs Assistance Bed Mobility: Supine to Sit;Sit to Supine     Supine to sit: Min assist Sit to supine: Mod assist   General bed mobility comments: assist to lift trunk upright, assist for legs into bed  Transfers Overall transfer level: Needs assistance Equipment used: Rolling walker (2 wheeled) Transfers: Sit to/from Stand Sit to Stand: Mod assist         General transfer comment: lifting assist from bed and cues for slowed descent, but sits uncontrolled, performed twice  Ambulation/Gait Ambulation/Gait assistance: Mod assist Ambulation Distance (Feet): 8 Feet (4' fwd, 4' reverse) Assistive device: Rolling walker (2  wheeled) Gait Pattern/deviations: Step-through pattern;Staggering right;Decreased stride length;Trunk flexed     General Gait Details: assist due to R lateral lean, poor tolerance and fatigues quickly  Stairs            Wheelchair Mobility    Modified Rankin (Stroke Patients Only)       Balance Overall balance assessment: Needs assistance   Sitting balance-Leahy Scale: Fair Sitting balance - Comments: sits at edge of bed unsupported but cannot take challenge, fell to R when leaning R   Standing balance support: Bilateral upper extremity supported Standing balance-Leahy Scale: Poor Standing balance comment: heavy UE support needed in standing                             Pertinent Vitals/Pain Pain Assessment: Faces Faces Pain Scale: Hurts even more Pain Location: R arm/shoulder when used to attempt to assist pt to stand Pain Descriptors / Indicators: Sore Pain Intervention(s): Monitored during session;Repositioned    Home Living Family/patient expects to be discharged to:: Private residence Living Arrangements: Other relatives Available Help at Discharge: Family;Available PRN/intermittently Type of Home: House   Entrance Stairs-Rails: Right;Left Entrance Stairs-Number of Steps: 3 Home Layout: One level Home Equipment: Walker - 2 wheels;Cane - single point;Shower seat      Prior Function Level of Independence: Independent with assistive device(s)         Comments: used cane at home previous to recent admission     Hand Dominance   Dominant Hand: Right    Extremity/Trunk Assessment   Upper Extremity Assessment: RUE deficits/detail RUE Deficits /  Details: limited shoulder elevation         Lower Extremity Assessment: RLE deficits/detail;LLE deficits/detail RLE Deficits / Details: AROM WFL, hip flexion 3/5, knee extension 4/5, ankle DF 4/5 LLE Deficits / Details: AROM WFL, hip flexion 3/5, knee extension 4/5, ankle DF 4/5  Cervical /  Trunk Assessment: Kyphotic  Communication   Communication: No difficulties  Cognition Arousal/Alertness: Awake/alert Behavior During Therapy: WFL for tasks assessed/performed Overall Cognitive Status: Within Functional Limits for tasks assessed                      General Comments General comments (skin integrity, edema, etc.): bruising along back and arms    Exercises        Assessment/Plan    PT Assessment Patient needs continued PT services  PT Diagnosis Abnormality of gait;Generalized weakness   PT Problem List Decreased strength;Decreased activity tolerance;Decreased balance;Decreased mobility;Decreased knowledge of use of DME  PT Treatment Interventions DME instruction;Balance training;Gait training;Functional mobility training;Patient/family education;Therapeutic activities;Therapeutic exercise;Stair training   PT Goals (Current goals can be found in the Care Plan section) Acute Rehab PT Goals Patient Stated Goal: To go home PT Goal Formulation: With patient Time For Goal Achievement: 09/25/15 Potential to Achieve Goals: Fair    Frequency Min 3X/week   Barriers to discharge        Co-evaluation               End of Session Equipment Utilized During Treatment: Gait belt Activity Tolerance: Patient limited by fatigue Patient left: in bed;with call bell/phone within reach           Time: 1219-7588 PT Time Calculation (min) (ACUTE ONLY): 30 min   Charges:   PT Evaluation $Initial PT Evaluation Tier I: 1 Procedure PT Treatments $Therapeutic Activity: 8-22 mins   PT G Codes:        WYNN,CYNDI September 30, 2015, 4:54 PM  Magda Kiel, [place] 30-Sep-2015

## 2015-09-11 NOTE — Progress Notes (Signed)
PULMONARY / CRITICAL CARE MEDICINE   Name: [name] MRN: [id number] DOB: 02/25/1933    ADMISSION DATE:  09/16/2015 CONSULTATION DATE:  09/08/2015  REFERRING MD:  Hospitalist Service  CHIEF COMPLAINT:  Dyspnea & Left Pneumothorax  SUBJECTIVE: Cough significantly improved today. Dyspnea significantly improved today. Denies any chest pain or chest pressure at present.  REVIEW OF SYSTEMS:  No nausea or vomiting. Tolerating diet. Denies any abdominal pain. No subjective fever or chills.  VITAL SIGNS: BP 90/59 mmHg  Pulse 87  Temp(Src) 97.6 F (36.4 C) (Oral)  Resp 29  Ht $R'5\' 10"'Yv$  (1.778 m)  Wt 181 lb 3.2 oz (82.192 kg)  BMI 26.00 kg/m2  SpO2 100%  HEMODYNAMICS:    VENTILATOR SETTINGS:    INTAKE / OUTPUT: I/O last 3 completed shifts: In: 1690 [P.O.:860; Other:130; IV Piggyback:700] Out: 2580 [Urine:2580]  PHYSICAL EXAMINATION: General:  Patient sleeping until awoken. No distress. Now awake and alert. Integument:  Warm & dry. No rash on exposed skin. Bruising of various ages on bilateral upper extremities. HEENT:  Moist mucus membranes. No scleral injection or icterus.  Cardiovascular:  Regular rate. Nearly completely resolved lower extremity edema. No appreciable JVD.  Pulmonary:  Good aeration bilaterally. Improved work of breathing on nasal cannula oxygen. Speaking in complete sentences. Abdomen: Soft. Normal bowel sounds. Nondistended. Grossly nontender. Neurological: Oriented to season, year, and place. Following commands and moving all 4 extremities.  LABS:  BMET  Recent Labs Lab 09/08/15 1550 09/10/15 0205 09/11/15 0228  NA 136 138 141  K 4.4 3.4* 3.7  CL 102 98* 99*  CO2 26 32 35*  BUN 36* 35* 40*  CREATININE 1.42* 1.22 1.26*  GLUCOSE 101* 157* 165*    Electrolytes  Recent Labs Lab 09/08/15 1550 09/10/15 0205 09/11/15 0228  CALCIUM 8.7* 8.6* 8.7*  MG  --  2.0 2.1  PHOS 4.3 3.7 3.1    CBC  Recent Labs Lab 09/09/15 0215  09/10/15 0205 09/11/15 0228  WBC 9.4 13.2* 11.2*  HGB 9.9* 9.7* 9.7*  HCT 32.5* 31.7* 32.6*  PLT 127* 132* 125*    Coag's  Recent Labs Lab 09/08/15 1550 09/10/15 0205 09/11/15 0228  APTT 41* 28 25  INR 2.22* 1.31 1.11    Sepsis Markers No results for input(s): LATICACIDVEN, PROCALCITON, O2SATVEN in the last 168 hours.  ABG No results for input(s): PHART, PCO2ART, PO2ART in the last 168 hours.  Liver Enzymes  Recent Labs Lab 09/08/15 1550 09/10/15 0205 09/11/15 0228  ALBUMIN 2.5* 2.5* 2.4*    Cardiac Enzymes  Recent Labs Lab 09/22/2015 1452 09/11/2015 2127 09/07/15 0253  TROPONINI 0.05* 0.05* 0.05*    Glucose  Recent Labs Lab 09/08/15 1549  GLUCAP 92    Imaging Dg Chest Port 1 View  09/10/2015  CLINICAL DATA:  Left-sided pneumothorax. EXAM: PORTABLE CHEST 1 VIEW COMPARISON:  09/09/2015 FINDINGS: Mild cardiac enlargement. Aortic atherosclerosis is identified. There is extensive fibrosis, scarring and volume loss involving the right lung. When compared with 09/09/2015 the appearance is unchanged. Left lung is relatively clear. No pneumothorax is visualized. IMPRESSION: 1. No change in the appearance of the right lung compared with 09/09/2015. 2. Left pneumothorax not visualized at this time. Electronically Signed   By: Kerby Moors M.D.   On: 09/10/2015 14:09     STUDIES:  CT Chest W/O 12/2:  Small anterior left pneumothorax. Changes in right lung consistent with prior radiation pneumonitis. CT Chest W/O 12/3:  Slight reduction in size of small left anterior pneumothorax.  Mild consolidation RLL Port CXR 12/4:  No obvious pneumothorax. RLL opacity persists. Port CXR 12/5:  No obvious pneumothorax. RLL opacity unchanged.  CULTURES: Sputum Ctx (not yet obtained)  ANTIBIOTICS: Zosyn 12/3>>> Vancomycin 12/3 - 12/6  SIGNIFICANT EVENTS: 12/01- Admit to Hospital 12/02- New Left Ptx 12/03- PCCM consulted & transfer to ICU 12/06- Transfer to  SDU  LINES/TUBES: PIV  ASSESSMENT / PLAN: 81 year old male with history of stage IIIa non-small cell lung cancer on the right as well as right sided radiation pneumonitis and spontaneous right pneumothorax in October 2016. Initially transitioned to the intensive care unit on 12/3 for worsening respiratory status in the setting of acute spontaneous left pneumothorax. The patient does have a mild COPD exacerbation as well. Questionable right lower lobe opacity suggestive of healthcare associated pneumonia with known underlying right-sided radiation pneumonitis. Respiratory status is continually improving. Patient appears significantly more comfortable in terms of his breathing today. Chest pain has completely resolved as well. Pneumothorax is mostly anterior and unable to be visualized on portable chest x-ray but repeat imaging has shown no evidence of progression of patient's pneumothorax and given his improving respiratory status I have not reordered a CT scan of the chest at this time. Palliative Medicine met with patient and family yesterday.patient has no signs of volume overload today and has diuresed well with Lasix IV twice a day.   1. Small left pneumothorax: Predominantly anterior. Spontaneous. Stable on repeat CT scan of the chest wall/3. No evidence on repeat portable chest x-ray imaging. Consider repeat CT scan of the chest without contrast tomorrow to obtain objective evidence of improving volume. Holding on flutter valve, incentive spirometer, & chest PT to prevent worsening of cough as well as pneumothorax. Also holding on noninvasive positive pressure ventilation to prevent worsening of pneumothorax. 2. COPD exacerbation: Continuing Solu-Medrol 40 mg IV every 6 hours & DuoNebs every 4 hours. Clinically improving. 3. Acute on chronic hypoxic respiratory failure: Diuresed well with Lasix IV twice a day. Discontinuing Lasix at this time. Patient's baseline oxygen requirement 3 L/m at home. He  will need an ambulatory saturation prior to discharge to ensure his hypoxia is appropriately treated.  4. Coagulopathy: Resolved. Secondary to heparin & Xarelto. Continuing to hold systemic anticoagulation given the patient's hemoptysis & acute pneumothorax. 5. Hemoptysis: Improving. Continuing to hold systemic and coagulation. Continuing Zosyn. Sputum culture not yet obtained. Discontinuing vancomycin. 6. Possible right lower lobe healthcare associated pneumonia: Respiratory culture not yet obtained. Discontinuing vancomycin today. Leukocytosis improving. Continuing Zosyn day #4/7. 7. Acute delirium: Resolved. Likely secondary to hypercapnia in the setting of COPD exacerbation. 8. Atrial fibrillation: Continuing diltiazem every 12 hours & digoxin. Monitoring the patient on telemetry. Cardiology following. Holding systemic anticoagulation given hemoptysis. 9. Acute renal failure: Resolved. Continuing to monitor renal function with urine output & trending BUN/creatinine daily. 10. Stage IIIa non-small cell lung cancer: Status post treatment & initially diagnosed February 2016. Followed by Dr. Julien Nordmann. 11. Anemia: Stable. No gross signs of active bleeding. Continuing to trend daily with CBC. 12. Thrombocytopenia: Mild. Continuing to trend daily with CBC. 13. History of hyperthyroidism: TSH normal on 12/1. 14. History of coronary artery disease: Followed by cardiology. 15. Nicotine dependence: Patient chews snuff daily. Continuing nicotine patch 7 mg/24 hours. 16. Prophylaxis: SCDs & Protonix by mouth daily at bedtime. 17. Diet: Regular diet. 18. Disposition: Transitioning patient to stepdown unit. Palliative medicine consulted. [facility] to assume care & PCCM will follow as consultant on 12/7. Transition of care called to Dr.  Dia Crawford on 12/6.  Sonia Baller Ashok Cordia, M.D. [facility] Pulmonary & Critical Care Pager:  [phone] After 3pm or if no response, call [phone] 09/11/2015, 11:09 AM

## 2015-09-11 NOTE — Progress Notes (Signed)
Report called to [employer]. Patient transferred with RN on monitor and received by RN at bedside.

## 2015-09-11 NOTE — Progress Notes (Signed)
Pharmacy Antibiotic Time-Out Note  [name] is a 81 y.o. year-old male admitted on 09/22/2015.  The patient is currently on Zosyn for sepsis/HAP.  Assessment/Plan: Day #4 of abx for sepsis/PNA. Plan for 7 days course per CCM. 12/4 CXR RLL opacity persists; solumedrol 40 q6h. Cough and dyspnea has significantly improved today. Afebrile, WBC slightly elevated at 11.2.  Plan: Continue Zosyn 3.375 gm IV q8h Stop vancomycin today Monitor clinical picture, renal function F/U LOT  Temp (24hrs), Avg:97.7 F (36.5 C), Min:97.5 F (36.4 C), Max:98.2 F (36.8 C)   Recent Labs Lab 09/07/15 0253 09/08/15 0930 09/09/15 0215 09/10/15 0205 09/11/15 0228  WBC 8.9 13.0* 9.4 13.2* 11.2*    Recent Labs Lab 09/16/2015 0940 09/08/15 0355 09/08/15 1550 09/10/15 0205 09/11/15 0228  CREATININE 1.44* 1.55* 1.42* 1.22 1.26*   Estimated Creatinine Clearance: 48.3 mL/min (by C-G formula based on Cr of 1.26).   Antimicrobial allergies: n/a  Antimicrobials this admission: Vancomycin 12/3 >> 12/6 Zosyn 12/3 >>  Microbiology Results:  MRSA PCR: negative  Thank you for allowing pharmacy to be a part of this patient's care.  Elenor Quinones, PharmD, BCPS Clinical Pharmacist Pager [phone] 09/11/2015 12:11 PM

## 2015-09-12 DIAGNOSIS — J9611 Chronic respiratory failure with hypoxia: Secondary | ICD-10-CM

## 2015-09-12 LAB — CBC WITH DIFFERENTIAL/PLATELET
BASOS ABS: 0 10*3/uL (ref 0.0–0.1)
Basophils Relative: 0 %
Eosinophils Absolute: 0 10*3/uL (ref 0.0–0.7)
Eosinophils Relative: 0 %
HEMATOCRIT: 30.8 % — AB (ref 39.0–52.0)
HEMOGLOBIN: 9.5 g/dL — AB (ref 13.0–17.0)
LYMPHS PCT: 1 %
Lymphs Abs: 0.1 10*3/uL — ABNORMAL LOW (ref 0.7–4.0)
MCH: 27.1 pg (ref 26.0–34.0)
MCHC: 30.8 g/dL (ref 30.0–36.0)
MCV: 87.7 fL (ref 78.0–100.0)
Monocytes Absolute: 0.3 10*3/uL (ref 0.1–1.0)
Monocytes Relative: 3 %
NEUTROS ABS: 8.3 10*3/uL — AB (ref 1.7–7.7)
NEUTROS PCT: 96 %
Platelets: 120 10*3/uL — ABNORMAL LOW (ref 150–400)
RBC: 3.51 MIL/uL — AB (ref 4.22–5.81)
RDW: 17.7 % — ABNORMAL HIGH (ref 11.5–15.5)
WBC: 8.7 10*3/uL (ref 4.0–10.5)

## 2015-09-12 LAB — RENAL FUNCTION PANEL
ALBUMIN: 2.3 g/dL — AB (ref 3.5–5.0)
ANION GAP: 9 (ref 5–15)
BUN: 41 mg/dL — ABNORMAL HIGH (ref 6–20)
CALCIUM: 8.8 mg/dL — AB (ref 8.9–10.3)
CO2: 33 mmol/L — ABNORMAL HIGH (ref 22–32)
Chloride: 98 mmol/L — ABNORMAL LOW (ref 101–111)
Creatinine, Ser: 1.11 mg/dL (ref 0.61–1.24)
GLUCOSE: 146 mg/dL — AB (ref 65–99)
PHOSPHORUS: 2.6 mg/dL (ref 2.5–4.6)
POTASSIUM: 3.5 mmol/L (ref 3.5–5.1)
SODIUM: 140 mmol/L (ref 135–145)

## 2015-09-12 LAB — MAGNESIUM: MAGNESIUM: 2 mg/dL (ref 1.7–2.4)

## 2015-09-12 LAB — PROTIME-INR
INR: 1.09 (ref 0.00–1.49)
Prothrombin Time: 14.3 seconds (ref 11.6–15.2)

## 2015-09-12 LAB — APTT: aPTT: 24 seconds (ref 24–37)

## 2015-09-12 MED ORDER — POLYETHYLENE GLYCOL 3350 17 G PO PACK: 17.0000 g | PACK | Freq: Every day | ORAL | Status: DC | PRN

## 2015-09-12 MED ORDER — PREDNISONE 50 MG PO TABS: 50.0000 mg | ORAL_TABLET | Freq: Every day | ORAL | Status: DC

## 2015-09-12 MED ORDER — BUDESONIDE-FORMOTEROL FUMARATE 160-4.5 MCG/ACT IN AERO
2.0000 | INHALATION_SPRAY | Freq: Two times a day (BID) | RESPIRATORY_TRACT | Status: DC
  Administered 2015-09-13 – 2015-09-18 (×11): 2 via RESPIRATORY_TRACT
  Filled 2015-09-12: qty 6

## 2015-09-12 MED ORDER — LEVOFLOXACIN 500 MG PO TABS
500.0000 mg | ORAL_TABLET | Freq: Every day | ORAL | Status: AC
  Administered 2015-09-12 – 2015-09-14 (×3): 500 mg via ORAL
  Filled 2015-09-12 (×3): qty 1

## 2015-09-12 MED ORDER — TIOTROPIUM BROMIDE MONOHYDRATE 18 MCG IN CAPS
18.0000 ug | ORAL_CAPSULE | Freq: Every day | RESPIRATORY_TRACT | Status: DC
  Administered 2015-09-13 – 2015-09-18 (×6): 18 ug via RESPIRATORY_TRACT
  Filled 2015-09-12: qty 5

## 2015-09-12 MED ORDER — TIOTROPIUM BROMIDE MONOHYDRATE 18 MCG IN CAPS: 18.0000 ug | ORAL_CAPSULE | Freq: Every day | RESPIRATORY_TRACT | Status: DC

## 2015-09-12 NOTE — Progress Notes (Signed)
Daily Progress Note   Patient Name: [name]       Date: 09/12/2015 DOB: 05-22-1933  Age: 81 y.o. MRN#: [id number] Attending Physician: Cherene Altes, MD Primary Care Physician: [name], MD Admit Date: 09/22/2015  Reason for Consultation/Follow-up: Establishing goals of care  Subjective: I met with [name] today along with his daughter at bedside. He is worried about his right leg which is more swollen and warm to touch - dopplers ordered. He is resistant to SNF rehab although his daughter and I try and convince him to consider this - they cannot provide care for him at home given his weakness. He also complains of cough a little worse - sounds productive but unable to expectorate. No other complaints.  Daughter also spoke to me outside the room and says that they fear he may have had a stroke at some point and are wondering if this could be causing his weakness. She says that his speech has been different since he declined on Friday and they have questioned stroke. She says that he would not want MRI. Not sure that work up would change care plan but will pass family suspicion on to primary team.    Length of Stay: 6 days  Current Medications: Scheduled Meds:  . colchicine  0.6 mg Oral Daily  . dextromethorphan-guaiFENesin  2 tablet Oral BID  . digoxin  0.125 mg Oral Daily  . diltiazem  180 mg Oral Q12H  . ipratropium-albuterol  3 mL Nebulization QID  . levofloxacin  500 mg Oral Daily  . nicotine  7 mg Transdermal Daily  . pantoprazole  40 mg Oral QHS  . [START ON 09/13/2015] predniSONE  50 mg Oral Q breakfast  . triamcinolone cream   Topical TID    Continuous Infusions:    PRN Meds: acetaminophen, albuterol, ondansetron (ZOFRAN) IV, polyethylene glycol,  traMADol, zolpidem  Physical Exam: Physical Exam  Constitutional: He is oriented to person, place, and time. He appears well-developed.  HENT:  Head: Normocephalic.  Cardiovascular: Normal rate.   Pulmonary/Chest: Effort normal.  Cough  Neurological: He is alert and oriented to person, place, and time.                Vital Signs: BP 151/59 mmHg  Pulse 98  Temp(Src) 97.4 F (36.3 C) (  Axillary)  Resp 20  Ht $R'5\' 10"'OB$  (1.778 m)  Wt 82.192 kg (181 lb 3.2 oz)  BMI 26.00 kg/m2  SpO2 91% SpO2: SpO2: 91 % O2 Device: O2 Device: Nasal Cannula O2 Flow Rate: O2 Flow Rate (L/min): 6 L/min  Intake/output summary:  Intake/Output Summary (Last 24 hours) at 09/12/15 1704 Last data filed at 09/12/15 1250  Gross per 24 hour  Intake    220 ml  Output   1050 ml  Net   -830 ml   LBM: Last BM Date: 09/07/15 Baseline Weight: Weight: 83.915 kg (185 lb) (pt reported) Most recent weight: Weight: 82.192 kg (181 lb 3.2 oz)       Palliative Assessment/Data: Flowsheet Rows        Most Recent Value   Intake Tab    Referral Department  Critical care   Unit at Time of Referral  ICU   Palliative Care Primary Diagnosis  Cancer   Date Notified  09/10/15   Palliative Care Type  New Palliative care   Reason for referral  Clarify Goals of Care   Date of Admission  09/07/2015   # of days IP prior to Palliative referral  4   Clinical Assessment    Psychosocial & Spiritual Assessment    Palliative Care Outcomes       Additional Data Reviewed: CBC    Component Value Date/Time   WBC 8.7 09/12/2015 0249   WBC 10.3 06/25/2015 0941   RBC 3.51* 09/12/2015 0249   RBC 4.54 06/25/2015 0941   HGB 9.5* 09/12/2015 0249   HGB 11.3* 06/25/2015 0941   HCT 30.8* 09/12/2015 0249   HCT 36.1* 06/25/2015 0941   PLT 120* 09/12/2015 0249   PLT 181 06/25/2015 0941   MCV 87.7 09/12/2015 0249   MCV 79.7 06/25/2015 0941   MCH 27.1 09/12/2015 0249   MCH 24.9* 06/25/2015 0941   MCHC 30.8 09/12/2015 0249   MCHC 31.3*  06/25/2015 0941   RDW 17.7* 09/12/2015 0249   RDW 18.7* 06/25/2015 0941   LYMPHSABS 0.1* 09/12/2015 0249   LYMPHSABS 0.5* 06/25/2015 0941   MONOABS 0.3 09/12/2015 0249   MONOABS 0.6 06/25/2015 0941   EOSABS 0.0 09/12/2015 0249   EOSABS 0.1 06/25/2015 0941   BASOSABS 0.0 09/12/2015 0249   BASOSABS 0.0 06/25/2015 0941    CMP     Component Value Date/Time   NA 140 09/12/2015 0249   NA 139 06/25/2015 0941   K 3.5 09/12/2015 0249   K 4.0 06/25/2015 0941   CL 98* 09/12/2015 0249   CO2 33* 09/12/2015 0249   CO2 29 06/25/2015 0941   GLUCOSE 146* 09/12/2015 0249   GLUCOSE 96 06/25/2015 0941   BUN 41* 09/12/2015 0249   BUN 28.7* 06/25/2015 0941   CREATININE 1.11 09/12/2015 0249   CREATININE 1.2 06/25/2015 0941   CALCIUM 8.8* 09/12/2015 0249   CALCIUM 9.4 06/25/2015 0941   PROT 6.4 06/25/2015 0941   PROT 6.5 05/15/2015 1700   ALBUMIN 2.3* 09/12/2015 0249   ALBUMIN 3.1* 06/25/2015 0941   AST 12 06/25/2015 0941   AST 22 05/15/2015 1700   ALT 17 06/25/2015 0941   ALT 10* 05/15/2015 1700   ALKPHOS 71 06/25/2015 0941   ALKPHOS 92 05/15/2015 1700   BILITOT 0.32 06/25/2015 0941   BILITOT 0.5 05/15/2015 1700   GFRNONAA >60 09/12/2015 0249   GFRAA >60 09/12/2015 0249       Problem List:  Patient Active Problem List   Diagnosis Date Noted  .  Palliative care encounter   . Non-small cell lung cancer (NSCLC) ([place])   . Pneumothorax on left   . COPD exacerbation ([place])   . Acute delirium   . Pleuritic chest pain 09/23/2015  . Chronic respiratory failure with hypoxia ([place]) 09/18/2015  . COPD (chronic obstructive pulmonary disease) ([place])   . History of pneumothorax   . Radiation pneumonitis ([place])   . Pleural effusion 05/31/2015  . CAD (coronary artery disease), native coronary artery 12/03/2014  . Thoracic ascending aortic aneurysm ([place]) 12/03/2014  . Current use of long term anticoagulation   . SCC (squamous cell carcinoma of lung) ([place])   . History of tobacco abuse 11/25/2014   . Chronic atrial fibrillation [facility])      Palliative Care Assessment & Plan    1.Code Status:  DNR    Code Status Orders        Start     Ordered   09/27/2015 1242  Do not attempt resuscitation (DNR)   Continuous    Question Answer Comment  In the event of cardiac or respiratory ARREST Do not call a "code blue"   In the event of cardiac or respiratory ARREST Do not perform Intubation, CPR, defibrillation or ACLS   In the event of cardiac or respiratory ARREST Use medication by any route, position, wound care, and other measures to relive pain and suffering. May use oxygen, suction and manual treatment of airway obstruction as needed for comfort.      09/14/2015 1241       2. Goals of Care/Additional Recommendations:  Treat the treatable. Appears that aggressive treatment is not his wish. No further chemotherapy.  Limitations on Scope of Treatment: No Chemotherapy  Desire for further Chaplaincy support:no  Psycho-social Needs: Caregiving  Support/Resources  3. Symptom Management:  Constipation: Relieved. Miralax ordered prn.   SOB: Per pulmonology. Continue oxygen.  Weakness: PT consult.  Bilat swelling in feet: Wishes to minimize steroids. Encouraged exercises in bed. TED hose.   4. Palliative Prophylaxis:   Bowel Regimen and Delirium Protocol  5. Prognosis: Unable to determine  6. Discharge Planning:  To be determined.    Thank you for allowing the Palliative Medicine Team to assist in the care of this patient.   Time In: 1210 Time Out: 1300 Total Time 32min Prolonged Time Billed  no         Pershing Proud, NP  85/11/7780, 5:04 PM  Please contact Palliative Medicine Team phone at [phone] for questions and concerns.

## 2015-09-12 NOTE — Care Management Note (Signed)
Case Management Note  Patient Details  Name: [name] MRN: [id number] Date of Birth: [birth year]  Subjective/Objective:     Pt states he lives with granddaughter and will have family available for 24/7 assistance.  Agrees that he needs home health PT, referral made to [facility] per choice.                         Expected Discharge Plan:  [place]  CM Consult  HH Arranged:  PT [facility] Agency:  [facility]  Status of Service:  In process, will continue to follow  Medicare Important Message Given:  Yes  Girard Cooter, RN 09/12/2015, 11:47 AM

## 2015-09-12 NOTE — Progress Notes (Signed)
PULMONARY / CRITICAL CARE MEDICINE   Name: [name] MRN: [id number] DOB: [birth year]    ADMISSION DATE:  10/02/2015 CONSULTATION DATE:  09/08/2015  REFERRING MD:  Hospitalist Service  CHIEF COMPLAINT:  Dyspnea & Left Pneumothorax   STUDIES:   SIGNIFICANT EVENTS: 12/01- Admit to Hospital 12/02- New Left Ptx CT Chest W/O 12/2:  Small anterior left pneumothorax. Changes in right lung consistent with prior radiation pneumonitis. CT Chest W/O 12/3:  Slight reduction in size of small left anterior pneumothorax. Mild consolidation RLL 12/03- PCCM consulted & transfer to ICU  Port CXR 12/4:  No obvious pneumothorax. RLL opacity persists. Port CXR 12/5:  No obvious pneumothorax. RLL opacity unchanged.  12/06- Transfer to SDU.  Cough significantly improved today. Dyspnea significantly improved today. Denies any chest pain or chest pressure at present.   SUBJECTIVE/OVERNIGHT/INTERVAL HX 09/12/15: LLE warm per daugther at bedsie and he feels it a bit heavier. Denies gout hx. Was on anticoagulants in past NOS per daughter. Breathing ok. Pall care notes - DNR but full medical care. Daughte thinks he needs PT . RN say home health PT set up/   REVIEW OF SYSTEMS:  No nausea or vomiting. Tolerating diet. Denies any abdominal pain. No subjective fever or chills.  VITAL SIGNS: BP 140/82 mmHg  Pulse 91  Temp(Src) 97.7 F (36.5 C) (Oral)  Resp 23  Ht $R'5\' 10"'jx$  (1.778 m)  Wt 82.192 kg (181 lb 3.2 oz)  BMI 26.00 kg/m2  SpO2 91%  HEMODYNAMICS:    VENTILATOR SETTINGS:    INTAKE / OUTPUT: I/O last 3 completed shifts: In: 1150 [P.O.:480; Other:120; IV Piggyback:550] Out: 2105 [Urine:2105]  PHYSICAL EXAMINATION: General:  No distress. Now awake and alert. Chronically unwell Integument:  Warm & dry. No rash on exposed skin. Bruising of various ages on bilateral upper extremities. HEENT:  Moist mucus membranes. No scleral injection or icterus.  Cardiovascular:  Regular rate. Nearly  completely resolved lower extremity edema. No appreciable JVD.  Pulmonary:  Good aeration bilaterally. Improved work of breathing on nasal cannula oxygen. Speaking in complete sentences. BARREL CHEST. PURSE LIP BREATHING +  Abdomen: Soft. Normal bowel sounds. Nondistended. Grossly nontender. Neurological: Oriented to season, year, and place. Following commands and moving all 4 extremities. EXT: warm RLE esp foot.No swelling. No redness  LABS: PULMONARY No results for input(s): PHART, PCO2ART, PO2ART, HCO3, TCO2, O2SAT in the last 168 hours.  Invalid input(s): PCO2, PO2  CBC  Recent Labs Lab 09/10/15 0205 09/11/15 0228 09/12/15 0249  HGB 9.7* 9.7* 9.5*  HCT 31.7* 32.6* 30.8*  WBC 13.2* 11.2* 8.7  PLT 132* 125* 120*    COAGULATION  Recent Labs Lab 09/08/15 1550 09/10/15 0205 09/11/15 0228 09/12/15 0249  INR 2.22* 1.31 1.11 1.09    CARDIAC   Recent Labs Lab 09/22/2015 1452 10/04/2015 2127 09/07/15 0253  TROPONINI 0.05* 0.05* 0.05*   No results for input(s): PROBNP in the last 168 hours.   CHEMISTRY  Recent Labs Lab 09/08/15 0355 09/08/15 1550 09/10/15 0205 09/11/15 0228 09/12/15 0249  NA 135 136 138 141 140  K 4.5 4.4 3.4* 3.7 3.5  CL 101 102 98* 99* 98*  CO2 25 26 32 35* 33*  GLUCOSE 111* 101* 157* 165* 146*  BUN 38* 36* 35* 40* 41*  CREATININE 1.55* 1.42* 1.22 1.26* 1.11  CALCIUM 9.1 8.7* 8.6* 8.7* 8.8*  MG  --   --  2.0 2.1 2.0  PHOS  --  4.3 3.7 3.1 2.6   Estimated Creatinine Clearance:  54.8 mL/min (by C-G formula based on Cr of 1.11).   LIVER  Recent Labs Lab 09/08/15 0930 09/08/15 1550 09/10/15 0205 09/11/15 0228 09/12/15 0249  ALBUMIN 2.6* 2.5* 2.5* 2.4* 2.3*  INR  --  2.22* 1.31 1.11 1.09     INFECTIOUS No results for input(s): LATICACIDVEN, PROCALCITON in the last 168 hours.   ENDOCRINE CBG (last 3)  No results for input(s): GLUCAP in the last 72 hours.       IMAGING x48h  - image(s) personally visualized  -    highlighted in bold Dg Chest Port 1 View  09/10/2015  CLINICAL DATA:  Left-sided pneumothorax. EXAM: PORTABLE CHEST 1 VIEW COMPARISON:  09/09/2015 FINDINGS: Mild cardiac enlargement. Aortic atherosclerosis is identified. There is extensive fibrosis, scarring and volume loss involving the right lung. When compared with 09/09/2015 the appearance is unchanged. Left lung is relatively clear. No pneumothorax is visualized. IMPRESSION: 1. No change in the appearance of the right lung compared with 09/09/2015. 2. Left pneumothorax not visualized at this time. Electronically Signed   By: Kerby Moors M.D.   On: 09/10/2015 14:09    LOS 6 days     ASSESSMENT / PLAN: 81 year old male with history of stage IIIa non-small cell lung cancer on the right as well as right sided radiation pneumonitis and spontaneous right pneumothorax in October 2016. Initially transitioned to the intensive care unit on 12/3 for worsening respiratory status in the setting of acute spontaneous left pneumothorax. The patient does have a mild COPD exacerbation as well. Questionable right lower lobe opacity suggestive of healthcare associated pneumonia with known underlying right-sided radiation pneumonitis. Respiratory status is continually improving. Patient appears significantly more comfortable in terms of his breathing today. Chest pain has completely resolved as well. Pneumothorax is mostly anterior and unable to be visualized on portable chest x-ray but repeat imaging has shown no evidence of progression of patient's pneumothorax and given his improving respiratory status I have not reordered a CT scan of the chest at this time. Palliative Medicine met with patient and family yesterday.patient has no signs of volume overload today and has diuresed well with Lasix IV twice a day.   1. Small left pneumothorax: Predominantly anterior. Spontaneous. Appears resolved on cxr 09/10/15.   PLAN: repeat cxr 09/13/15 and if stable opd fu. If  recurs needs VATS pleurodesis. > 30% risk for recurrence next 12 months given copd   2. COPD exacerbation (reported on chronic steroids pred $RemoveBefor'10mg'[id number]$   per RN): On Solu-Medrol 40 mg IV every 6 hours & DuoNebs every 4 hours. Clinically improving.  PLAN: change to prednisone.  3. Acute on chronic hypoxic respiratory failure:  Off lasix. Appears baseline  Continue o2 for pusle ox > 88%  Check ABG 09/13/15 to assess if near baseline 4. Coagulopathy with hemoptysis: Resolved. Secondary to heparin & Xarelto.   Continuing to hold systemic anticoagulation given the patient's hemoptysis & acute pneumothorax. 5. Possible right lower lobe healthcare associated pneumonia:  Leukocytosis improving. On  Zosyn #5/7. QTc < 566msec on 10/01/2015  Zosyn 12/3>>>12/7, levquin 17 >> (3 more days) Vancomycin 12/3 - 12/6    6. Acute delirium: Resolved. Likely secondary to hypercapnia in the setting of COPD exacerbation  Monitor. 7.   Atrial fibrillation:     Cardiology following. Holding systemic anticoagulation given hemoptysis. 7. Acute renal failure: Resolved. Continuing to monitor renal function with urine output & trending BUN/creatinine daily. 8. Stage IIIa non-small cell lung cancer: Status post treatment & initially diagnosed February 2016.  Followed by Dr. Julien Nordmann.  9. NEW ISSUE - RLE warmth  Plan; Get LE duplex  daguther and patient updated    Dr. Brand Males, M.D., [facility].C.P Pulmonary and Critical Care Medicine Staff Physician [facility] Pager: [phone], If no answer or between  15:00h - 7:00h: call [phone]  09/12/2015 12:03 PM

## 2015-09-12 NOTE — Progress Notes (Signed)
[place] TEAM 1 - Stepdown/ICU TEAM PROGRESS NOTE  ZAKARIYAH FREIMARK IFO:[id number] DOB: 03-21-1933 DOA: 09/30/2015 PCP: [name], MD  Admit HPI / Brief Narrative: 81 year old male patient known chronic hypoxemic respiratory failure secondary to COPD on 3 L oxygen, non-small cell lung cancer who completed chemotherapy and radiation in May 2016, and a recent admission for spontaneous pneumothorax on 11/5 who presented to the ER with abrupt onset of left sided anterior pleuritic-type chest pain.  Echocardiogram noted only a small pericardial effusion.   After admission the patient was noted to have increased work of breathing and rapid response team was called. Patient complained of shortness of breath and also coughing up blood. CT thorax was done which showed 15% pneumothorax on the left.   Significant Events: 12/01- Admit to [facility] 12/02 - New Left Ptx - CT Chest: Small anterior left pneumothorax. Changes in right lung consistent with prior radiation pneumonitis. 12/03 CT Chest: Slight reduction in size of small left anterior pneumothorax. Mild consolidation RLL 12/03 - PCCM consulted & transfered to ICU 12/04 Port CXR: No obvious pneumothorax. RLL opacity persists. 12/05 Port CXR: No obvious pneumothorax. RLL opacity unchanged. 12/06 Transfer to SDU.   HPI/Subjective: In that all active issues were addressed by PCCM today, I have opted not to examine the patient so as to avoid duplication of services.    Assessment/Plan:  Small left pneumothorax Care per PCCM as follows:  "repeat cxr 09/13/15 and if stable opd fu. If recurs needs VATS pleurodesis"  COPD exacerbation Improving - weaning care as per PCCM recs   New R LE warmth  PCCM ordered venous duplex to r/o DVT  Acute on chronic hypoxic respiratory failure baseline oxygen requirement 3 L/m at home - PCCM checking ABG in AM   Coagulopathy w/ Hemoptysis Resolved - secondary to heparin & Xarelto - continuing to  hold systemic anticoagulation given the patient's hemoptysis & acute pneumothorax.  Possible right lower lobe healthcare associated pneumonia  Continuing Zosyn day #5/7  Acute delirium Resolved - likely secondary to hypercapnia in the setting of COPD exacerbation.  Atrial fibrillation Cardiology following - holding systemic anticoagulation given hemoptysis - HR controlled   Acute renal failure Resolved   Stage IIIa non-small cell lung cancer Status post treatment - initially diagnosed February 2016 - followed by Dr. Julien Nordmann  Anemia Stable - no signs of active bleeding  Thrombocytopenia Mild - follow trend   History of hyperthyroidism TSH normal on 12/1  History of coronary artery disease Followed by Cardiology  Nicotine dependence continuing nicotine patch   Code Status: FULL Family Communication:  Disposition Plan: SDU  Consultants: PCCM Cardiology - Wynonia Lawman  Antibiotics: levaquin 12/7 >  DVT prophylaxis: SCDs  Objective: Blood pressure 151/59, pulse 98, temperature 97.4 F (36.3 C), temperature source Axillary, resp. rate 20, height '5\' 10"'$  (1.778 m), weight 82.192 kg (181 lb 3.2 oz), SpO2 90 %.  Intake/Output Summary (Last 24 hours) at 09/12/15 1656 Last data filed at 09/12/15 1250  Gross per 24 hour  Intake    220 ml  Output   1050 ml  Net   -830 ml    Exam:   Data Reviewed: Basic Metabolic Panel:  Recent Labs Lab 09/08/15 0355 09/08/15 1550 09/10/15 0205 09/11/15 0228 09/12/15 0249  NA 135 136 138 141 140  K 4.5 4.4 3.4* 3.7 3.5  CL 101 102 98* 99* 98*  CO2 25 26 32 35* 33*  GLUCOSE 111* 101* 157* 165* 146*  BUN 38* 36* 35*  40* 41*  CREATININE 1.55* 1.42* 1.22 1.26* 1.11  CALCIUM 9.1 8.7* 8.6* 8.7* 8.8*  MG  --   --  2.0 2.1 2.0  PHOS  --  4.3 3.7 3.1 2.6    CBC:  Recent Labs Lab 09/24/2015 0940  09/08/15 0930 09/09/15 0215 09/10/15 0205 09/11/15 0228 09/12/15 0249  WBC 12.7*  < > 13.0* 9.4 13.2* 11.2* 8.7  NEUTROABS 10.9*   --   --   --  12.5* 10.7* 8.3*  HGB 11.4*  < > 10.3* 9.9* 9.7* 9.7* 9.5*  HCT 37.4*  < > 34.7* 32.5* 31.7* 32.6* 30.8*  MCV 86.2  < > 87.2 85.8 85.9 88.1 87.7  PLT 151  < > 132* 127* 132* 125* 120*  < > = values in this interval not displayed.  Liver Function Tests:  Recent Labs Lab 09/08/15 0930 09/08/15 1550 09/10/15 0205 09/11/15 0228 09/12/15 0249  ALBUMIN 2.6* 2.5* 2.5* 2.4* 2.3*    Coags:  Recent Labs Lab 09/08/15 1550 09/10/15 0205 09/11/15 0228 09/12/15 0249  INR 2.22* 1.31 1.11 1.09    Recent Labs Lab 09/07/15 1117 09/08/15 1550 09/10/15 0205 09/11/15 0228 09/12/15 0249  APTT 154* 41* '28 25 24    '$ Cardiac Enzymes:  Recent Labs Lab 09/18/2015 1452 10/04/2015 2127 09/07/15 0253  TROPONINI 0.05* 0.05* 0.05*    CBG:  Recent Labs Lab 09/08/15 1549  GLUCAP 92    Recent Results (from the past 240 hour(s))  MRSA PCR Screening     Status: None   Collection Time: 09/08/15  2:59 PM  Result Value Ref Range Status   MRSA by PCR NEGATIVE NEGATIVE Final    Comment:        The GeneXpert MRSA Assay (FDA approved for NASAL specimens only), is one component of a comprehensive MRSA colonization surveillance program. It is not intended to diagnose MRSA infection nor to guide or monitor treatment for MRSA infections.      Studies:   Recent x-ray studies have been reviewed in detail by the Attending Physician  Scheduled Meds:  Scheduled Meds: . colchicine  0.6 mg Oral Daily  . dextromethorphan-guaiFENesin  2 tablet Oral BID  . digoxin  0.125 mg Oral Daily  . diltiazem  180 mg Oral Q12H  . ipratropium-albuterol  3 mL Nebulization QID  . levofloxacin  500 mg Oral Daily  . nicotine  7 mg Transdermal Daily  . pantoprazole  40 mg Oral QHS  . [START ON 09/13/2015] predniSONE  50 mg Oral Q breakfast  . triamcinolone cream   Topical TID    Time spent on care of this patient: no charge    Cherene Altes , MD   [facility] Office   [phone] Pager - Text Page per Shea Evans as per below:  On-Call/Text Page:      [email or web address]      password [email or web address]  If 7PM-7AM, please contact night-coverage [email or web address] Password [email or web address] 09/12/2015, 4:56 PM   LOS: 6 days

## 2015-09-13 ENCOUNTER — Encounter (HOSPITAL_COMMUNITY): Payer: Medicare Other

## 2015-09-13 ENCOUNTER — Inpatient Hospital Stay (HOSPITAL_COMMUNITY): Payer: Medicare Other

## 2015-09-13 ENCOUNTER — Ambulatory Visit (HOSPITAL_COMMUNITY): Payer: Medicare Other

## 2015-09-13 DIAGNOSIS — R042 Hemoptysis: Secondary | ICD-10-CM

## 2015-09-13 DIAGNOSIS — R0602 Shortness of breath: Secondary | ICD-10-CM

## 2015-09-13 DIAGNOSIS — E873 Alkalosis: Secondary | ICD-10-CM | POA: Insufficient documentation

## 2015-09-13 DIAGNOSIS — J939 Pneumothorax, unspecified: Secondary | ICD-10-CM | POA: Insufficient documentation

## 2015-09-13 DIAGNOSIS — J189 Pneumonia, unspecified organism: Secondary | ICD-10-CM | POA: Insufficient documentation

## 2015-09-13 DIAGNOSIS — M79609 Pain in unspecified limb: Secondary | ICD-10-CM

## 2015-09-13 DIAGNOSIS — J9621 Acute and chronic respiratory failure with hypoxia: Secondary | ICD-10-CM | POA: Insufficient documentation

## 2015-09-13 DIAGNOSIS — Z720 Tobacco use: Secondary | ICD-10-CM | POA: Insufficient documentation

## 2015-09-13 DIAGNOSIS — M7989 Other specified soft tissue disorders: Secondary | ICD-10-CM

## 2015-09-13 LAB — CBC WITH DIFFERENTIAL/PLATELET
BASOS ABS: 0 10*3/uL (ref 0.0–0.1)
Basophils Relative: 0 %
Eosinophils Absolute: 0 10*3/uL (ref 0.0–0.7)
Eosinophils Relative: 0 %
HCT: 33.6 % — ABNORMAL LOW (ref 39.0–52.0)
HEMOGLOBIN: 9.9 g/dL — AB (ref 13.0–17.0)
LYMPHS ABS: 0.5 10*3/uL — AB (ref 0.7–4.0)
LYMPHS PCT: 5 %
MCH: 26 pg (ref 26.0–34.0)
MCHC: 29.5 g/dL — AB (ref 30.0–36.0)
MCV: 88.2 fL (ref 78.0–100.0)
Monocytes Absolute: 0.3 10*3/uL (ref 0.1–1.0)
Monocytes Relative: 3 %
NEUTROS ABS: 8.8 10*3/uL — AB (ref 1.7–7.7)
Neutrophils Relative %: 92 %
Platelets: 132 10*3/uL — ABNORMAL LOW (ref 150–400)
RBC: 3.81 MIL/uL — AB (ref 4.22–5.81)
RDW: 17.5 % — ABNORMAL HIGH (ref 11.5–15.5)
WBC: 9.6 10*3/uL (ref 4.0–10.5)

## 2015-09-13 LAB — BLOOD GAS, ARTERIAL
ACID-BASE EXCESS: 11.2 mmol/L — AB (ref 0.0–2.0)
Bicarbonate: 35.7 mEq/L — ABNORMAL HIGH (ref 20.0–24.0)
Drawn by: 404151
O2 Content: 6 L/min
O2 SAT: 93.7 %
PATIENT TEMPERATURE: 98.6
PCO2 ART: 52.1 mmHg — AB (ref 35.0–45.0)
TCO2: 37.3 mmol/L (ref 0–100)
pH, Arterial: 7.451 — ABNORMAL HIGH (ref 7.350–7.450)
pO2, Arterial: 70.8 mmHg — ABNORMAL LOW (ref 80.0–100.0)

## 2015-09-13 LAB — RENAL FUNCTION PANEL
Albumin: 2.6 g/dL — ABNORMAL LOW (ref 3.5–5.0)
Anion gap: 7 (ref 5–15)
BUN: 31 mg/dL — ABNORMAL HIGH (ref 6–20)
CHLORIDE: 97 mmol/L — AB (ref 101–111)
CO2: 36 mmol/L — AB (ref 22–32)
Calcium: 9.1 mg/dL (ref 8.9–10.3)
Creatinine, Ser: 0.85 mg/dL (ref 0.61–1.24)
Glucose, Bld: 131 mg/dL — ABNORMAL HIGH (ref 65–99)
POTASSIUM: 3.7 mmol/L (ref 3.5–5.1)
Phosphorus: 2.7 mg/dL (ref 2.5–4.6)
Sodium: 140 mmol/L (ref 135–145)

## 2015-09-13 LAB — PROTIME-INR
INR: 1.08 (ref 0.00–1.49)
PROTHROMBIN TIME: 14.2 s (ref 11.6–15.2)

## 2015-09-13 LAB — DIGOXIN LEVEL: Digoxin Level: 1.2 ng/mL (ref 0.8–2.0)

## 2015-09-13 LAB — EXPECTORATED SPUTUM ASSESSMENT W REFEX TO RESP CULTURE: SPECIAL REQUESTS: NORMAL

## 2015-09-13 LAB — MAGNESIUM: MAGNESIUM: 2.4 mg/dL (ref 1.7–2.4)

## 2015-09-13 LAB — APTT: aPTT: 22 seconds — ABNORMAL LOW (ref 24–37)

## 2015-09-13 MED ORDER — PREDNISONE 50 MG PO TABS
50.0000 mg | ORAL_TABLET | Freq: Every day | ORAL | Status: DC
  Administered 2015-09-13 – 2015-09-14 (×2): 50 mg via ORAL
  Filled 2015-09-13 (×2): qty 1

## 2015-09-13 MED ORDER — WHITE PETROLATUM GEL
Status: AC
  Administered 2015-09-13: 0.2
  Filled 2015-09-13: qty 1

## 2015-09-13 MED ORDER — ENSURE ENLIVE PO LIQD
237.0000 mL | Freq: Two times a day (BID) | ORAL | Status: DC
  Administered 2015-09-14: 237 mL via ORAL

## 2015-09-13 NOTE — Progress Notes (Signed)
VASCULAR LAB PRELIMINARY  PRELIMINARY  PRELIMINARY  PRELIMINARY  Bilateral lower extremity venous duplex completed.    Preliminary report:  Bilateral:  No evidence of DVT, superficial thrombosis, or Baker's Cyst.   Valeree Leidy, RVS 09/13/2015, 7:02 PM

## 2015-09-13 NOTE — Progress Notes (Signed)
[place] TEAM 1 - Stepdown/ICU TEAM Progress Note  RAYMON SCHLARB RKY:[id number] DOB: [birth year] DOA: 09/20/2015 PCP: [name], MD  Admit HPI / Brief Narrative: 81 year old WM PMHx  Chronic Hypoxemic Respiratory Failure 2dary COPD on 3 L oxygen, Stage IIIA  NSCLC completed Chemotherapy and XRT May 2016, CAD native artery, Chronic Atrial Fibrillation, Thoracic Ascending AA, and a recent admission for spontaneous pneumothorax on 11/5   Presented to the ER with abrupt onset of left sided anterior pleuritic-type chest pain. Echocardiogram noted only a small pericardial effusion.   After admission the patient was noted to have increased work of breathing and rapid response team was called. Patient complained of shortness of breath and also coughing up blood. CT thorax was done which showed 15% pneumothorax on the left.   HPI/Subjective: 12/8 A/O 4, patient is COMPETENT. Patient states he would like to go home with palliative care/hospice injuring eggnog for Christmas.  Assessment/Plan: Small left pneumothorax -Care per PCCM as follows: "repeat cxr 09/13/15 stable   COPD exacerbation -Improving - weaning care as per PCCM recs   New R LE warmth  -PCCM ordered venous duplex to r/o DVT  Acute on chronic hypoxic respiratory failure -baseline oxygen requirement 3 L/m at home   Metabolic alkalosis -Most likely secondary to pneumonia. Continue pneumonia treatment  Coagulopathy w/ Hemoptysis -secondary to heparin & Xarelto - continuing to hold systemic anticoagulation given the patient's hemoptysis & acute pneumothorax.  Possible right lower lobe healthcare associated pneumonia -Patient unable to fully clear secretions NTS PRN -Continue current antibiotics  Acute delirium -Resolved   Atrial fibrillation -Cardiology following - holding systemic anticoagulation given hemoptysis - HR controlled   Acute renal failure -Resolved   Stage IIIa non-small cell lung cancer -Status  post treatment - initially diagnosed February 2016 - followed by Dr. Julien Nordmann -Patient wishes to go home on palliative care/hospice care  Anemia -Stable - no signs of active bleeding  Thrombocytopenia -Mild; stable   History of hyperthyroidism -TSH normal on 12/1  History of coronary artery disease -Followed by Cardiology  Tobacco abuse -continuing nicotine patch   Goals of care -Had long discussion with patient on what his wishes were concerning his current diagnosis and treatment plan. Patient states he knows he will have to go at some point. Would like to spend the remainder of his days at home over Christmas holiday drinking eggnog. -Was not sure if family would understand but these his wishes. Readdress with palliative care in the a.m.   Code Status: DO NOT RESUSCITATE Family Communication: no family present at time of exam Disposition Plan: Home with palliative care/hospice care    Consultants: PCCM Cardiology - [place]  Procedure/Significant Events: 12/01- Admit to Hospital 12/02 - New Left Ptx - CT Chest: Small anterior left pneumothorax. Changes in right lung consistent with prior radiation pneumonitis. 12/2 echocardiogram; - Left ventricle: moderate LVH. -LVEF= 55%- 60%.  - Left atrium: severely dilated.- Right atrium:  severely dilated. - Pericardium, extracardiac: effusion appears to me more prominent on the free wall and apex;also present  Posteriorly. Small to moderate pericardial effusion was identified circumferential to the heart. No hemodynamic compromise. 12/03 CT Chest: Slight reduction in size of small left anterior pneumothorax. Mild consolidation RLL 12/03 - PCCM consulted & transfered to ICU 12/04 Port CXR: No obvious pneumothorax. RLL opacity persists. 12/05 Port CXR: No obvious pneumothorax. RLL opacity unchanged. 12/06 Transfer to SDU.  12/8 PCXR;Stable right suprahilar lesion./chronic interstitial lung disease and pleural  parenchymal scarring, particularly on  the right. 12/8 bilateral lower extremity Doppler; negative DVT/SVT/Baker's cyst .  Culture   Antibiotics: levaquin 12/7 >  DVT prophylaxis: SCD   Devices NA   LINES / TUBES:  NA    Continuous Infusions:   Objective: VITAL SIGNS: Temp: 97.8 F (36.6 C) (12/08 2000) Temp Source: Oral (12/08 2000) BP: 130/80 mmHg (12/08 2000) Pulse Rate: 85 (12/08 1630) SPO2; FIO2:   Intake/Output Summary (Last 24 hours) at 09/13/15 2036 Last data filed at 09/13/15 0600  Gross per 24 hour  Intake    240 ml  Output    800 ml  Net   -560 ml     Exam: General: A/O 4, acute on chronic respiratory distress.  Eyes: Negative headache, eye pain, double vision,negative scleral hemorrhage ENT: Negative Runny nose, negative ear pain, negative gingival bleeding, Neck:  Negative scars, masses, torticollis, lymphadenopathy, JVD Lungs: right lung fields rhonchorous, decreased breath sounds left lung fields, bronchial sputum sounds.  Cardiovascular: Tachycardic, Regular rhythm without murmur gallop or rub normal S1 and S2 Abdomen:negative abdominal pain, nondistended, positive soft, bowel sounds, no rebound, no ascites, no appreciable mass Extremities: No significant cyanosis, clubbing, or edema bilateral lower extremities Psychiatric:  Negative depression, negative anxiety, negative fatigue, negative mania Neurologic:  Cranial nerves II through XII intact, tongue/uvula midline, all extremities muscle strength 5/5, sensation intact throughout, negative dysarthria, negative expressive aphasia, negative receptive aphasia.   Data Reviewed: Basic Metabolic Panel:  Recent Labs Lab 09/08/15 1550 09/10/15 0205 09/11/15 0228 09/12/15 0249 09/13/15 0416  NA 136 138 141 140 140  K 4.4 3.4* 3.7 3.5 3.7  CL 102 98* 99* 98* 97*  CO2 26 32 35* 33* 36*  GLUCOSE 101* 157* 165* 146* 131*  BUN 36* 35* 40* 41* 31*  CREATININE 1.42* 1.22 1.26* 1.11 0.85   CALCIUM 8.7* 8.6* 8.7* 8.8* 9.1  MG  --  2.0 2.1 2.0 2.4  PHOS 4.3 3.7 3.1 2.6 2.7   Liver Function Tests:  Recent Labs Lab 09/08/15 1550 09/10/15 0205 09/11/15 0228 09/12/15 0249 09/13/15 0416  ALBUMIN 2.5* 2.5* 2.4* 2.3* 2.6*   No results for input(s): LIPASE, AMYLASE in the last 168 hours. No results for input(s): AMMONIA in the last 168 hours. CBC:  Recent Labs Lab 09/09/15 0215 09/10/15 0205 09/11/15 0228 09/12/15 0249 09/13/15 0416  WBC 9.4 13.2* 11.2* 8.7 9.6  NEUTROABS  --  12.5* 10.7* 8.3* 8.8*  HGB 9.9* 9.7* 9.7* 9.5* 9.9*  HCT 32.5* 31.7* 32.6* 30.8* 33.6*  MCV 85.8 85.9 88.1 87.7 88.2  PLT 127* 132* 125* 120* 132*   Cardiac Enzymes:  Recent Labs Lab 10/02/2015 2127 09/07/15 0253  TROPONINI 0.05* 0.05*   BNP (last 3 results)  Recent Labs  05/16/15 0458 09/30/2015 0940 09/08/15 1352  BNP 268.1* 135.7* 157.9*    ProBNP (last 3 results)  Recent Labs  05/31/15 1705  PROBNP 91.0    CBG:  Recent Labs Lab 09/08/15 1549  GLUCAP 92    Recent Results (from the past 240 hour(s))  MRSA PCR Screening     Status: None   Collection Time: 09/08/15  2:59 PM  Result Value Ref Range Status   MRSA by PCR NEGATIVE NEGATIVE Final    Comment:        The GeneXpert MRSA Assay (FDA approved for NASAL specimens only), is one component of a comprehensive MRSA colonization surveillance program. It is not intended to diagnose MRSA infection nor to guide or monitor treatment for MRSA infections.   Culture,  expectorated sputum-assessment     Status: None   Collection Time: 09/13/15 11:00 AM  Result Value Ref Range Status   Specimen Description SPUTUM  Final   Special Requests Normal  Final   Sputum evaluation   Final    THIS SPECIMEN IS ACCEPTABLE. RESPIRATORY CULTURE REPORT TO FOLLOW.   Report Status 09/13/2015 FINAL  Final     Studies:  Recent x-ray studies have been reviewed in detail by the Attending Physician  Scheduled Meds:  Scheduled  Meds: . budesonide-formoterol  2 puff Inhalation BID  . colchicine  0.6 mg Oral Daily  . dextromethorphan-guaiFENesin  2 tablet Oral BID  . digoxin  0.125 mg Oral Daily  . diltiazem  180 mg Oral Q12H  . [START ON 09/14/2015] feeding supplement (ENSURE ENLIVE)  237 mL Oral BID BM  . ipratropium-albuterol  3 mL Nebulization QID  . levofloxacin  500 mg Oral Daily  . nicotine  7 mg Transdermal Daily  . pantoprazole  40 mg Oral QHS  . predniSONE  50 mg Oral Q breakfast  . tiotropium  18 mcg Inhalation Daily  . triamcinolone cream   Topical TID    Time spent on care of this patient: 40 mins   Kylia Grajales, Geraldo Docker , MD  [facility] Office  [phone] Pager - [phone]  On-Call/Text Page:      [email or web address]      password [email or web address]  If 7PM-7AM, please contact night-coverage [email or web address] Password [email or web address] 09/13/2015, 8:36 PM   LOS: 7 days   Care during the described time interval was provided by me .  I have reviewed this patient's available data, including medical history, events of note, physical examination, and all test results as part of my evaluation. I have personally reviewed and interpreted all radiology studies.   Dia Crawford, MD [phone] Pager

## 2015-09-13 NOTE — Progress Notes (Signed)
PULMONARY / CRITICAL CARE MEDICINE   Name: [name] MRN: [id number] DOB: [birth year]    ADMISSION DATE:  09/29/2015 CONSULTATION DATE:  09/08/2015  REFERRING MD:  Hospitalist Service  CHIEF COMPLAINT:  Dyspnea & Left Pneumothorax   STUDIES:   SIGNIFICANT EVENTS: 12/01- Admit to Hospital 12/02- New Left Ptx CT Chest W/O 12/2:  Small anterior left pneumothorax. Changes in right lung consistent with prior radiation pneumonitis. CT Chest W/O 12/3:  Slight reduction in size of small left anterior pneumothorax. Mild consolidation RLL 12/03- PCCM consulted & transfer to ICU  Port CXR 12/4:  No obvious pneumothorax. RLL opacity persists. Port CXR 12/5:  No obvious pneumothorax. RLL opacity unchanged.  12/06- Transfer to SDU.  Cough significantly improved today. Dyspnea significantly improved today. Denies any chest pain or chest pressure at present.   09/12/15: RLE warm per daugther at bedsie and he feels it a bit heavier. Denies gout hx. Was on anticoagulants in past NOS per daughter. Breathing ok. Pall care notes - DNR but full medical care. Daughte thinks he needs PT . RN say home health PT set up/  No nausea or vomiting. Tolerating diet. Denies any abdominal pain. No subjective fever or chills.    SUBJECTIVE/OVERNIGHT/INTERVAL HX 09/13/15 - says RLE warmth better but still +. Review of chart shows family now leaning more towards comfort but not fully decided. PAtient denies issues. Denies hemoptysis though RN reported a small amount.   VITAL SIGNS: BP 135/75 mmHg  Pulse 97  Temp(Src) 97.9 F (36.6 C) (Oral)  Resp 23  Ht $R'5\' 10"'YC$  (1.778 m)  Wt 82.192 kg (181 lb 3.2 oz)  BMI 26.00 kg/m2  SpO2 95%  HEMODYNAMICS:    VENTILATOR SETTINGS:    INTAKE / OUTPUT: I/O last 3 completed shifts: In: 340 [P.O.:240; IV Piggyback:100] Out: 1850 [Urine:1850]  PHYSICAL EXAMINATION: General:  No distress. Now awake and alert. Chronically unwell Integument:  Warm & dry. No rash on  exposed skin. Bruising of various ages on bilateral upper extremities. HEENT:  Moist mucus membranes. No scleral injection or icterus.  Cardiovascular:  Regular rate. Nearly completely resolved lower extremity edema. No appreciable JVD.  Pulmonary:  Good aeration bilaterally. Improved work of breathing on nasal cannula oxygen. Speaking in complete sentences. BARREL CHEST. PURSE LIP BREATHING +  Abdomen: Soft. Normal bowel sounds. Nondistended. Grossly nontender. Neurological: Oriented to season, year, and place. Following commands and moving all 4 extremities. EXT: warm RLE esp foot - improved.No swelling. No redness  LABS: PULMONARY  Recent Labs Lab 09/13/15 0450  PHART 7.451*  PCO2ART 52.1*  PO2ART 70.8*  HCO3 35.7*  TCO2 37.3  O2SAT 93.7    CBC  Recent Labs Lab 09/11/15 0228 09/12/15 0249 09/13/15 0416  HGB 9.7* 9.5* 9.9*  HCT 32.6* 30.8* 33.6*  WBC 11.2* 8.7 9.6  PLT 125* 120* 132*    COAGULATION  Recent Labs Lab 09/08/15 1550 09/10/15 0205 09/11/15 0228 09/12/15 0249 09/13/15 0416  INR 2.22* 1.31 1.11 1.09 1.08    CARDIAC    Recent Labs Lab 09/29/2015 1452 09/25/2015 2127 09/07/15 0253  TROPONINI 0.05* 0.05* 0.05*   No results for input(s): PROBNP in the last 168 hours.   CHEMISTRY  Recent Labs Lab 09/08/15 1550 09/10/15 0205 09/11/15 0228 09/12/15 0249 09/13/15 0416  NA 136 138 141 140 140  K 4.4 3.4* 3.7 3.5 3.7  CL 102 98* 99* 98* 97*  CO2 26 32 35* 33* 36*  GLUCOSE 101* 157* 165* 146* 131*  BUN  36* 35* 40* 41* 31*  CREATININE 1.42* 1.22 1.26* 1.11 0.85  CALCIUM 8.7* 8.6* 8.7* 8.8* 9.1  MG  --  2.0 2.1 2.0 2.4  PHOS 4.3 3.7 3.1 2.6 2.7   Estimated Creatinine Clearance: 71.6 mL/min (by C-G formula based on Cr of 0.85).   LIVER  Recent Labs Lab 09/08/15 1550 09/10/15 0205 09/11/15 0228 09/12/15 0249 09/13/15 0416  ALBUMIN 2.5* 2.5* 2.4* 2.3* 2.6*  INR 2.22* 1.31 1.11 1.09 1.08     INFECTIOUS No results for input(s):  LATICACIDVEN, PROCALCITON in the last 168 hours.   ENDOCRINE CBG (last 3)  No results for input(s): GLUCAP in the last 72 hours.       IMAGING x48h  - image(s) personally visualized  -   highlighted in bold Dg Chest Port 1 View  09/13/2015  CLINICAL DATA:  Pneumothorax. EXAM: PORTABLE CHEST 1 VIEW COMPARISON:  09/10/2015.  09/12/2015.  CT 09/07/2015. FINDINGS: Mediastinum and hilar structures are stable. Stable right suprahilar density. Stable chronic interstitial changes, particular on the right. Stable changes of pleural parenchymal scarring. No pneumothorax. Stable cardiomegaly. IMPRESSION: 1.  Stable right suprahilar lesion. 2. Stable chronic interstitial lung disease and pleural parenchymal scarring, particularly on the right. 3.  Stable cardiomegaly. Electronically Signed   By: Marcello Moores  Register   On: 09/13/2015 07:27   No ptx LOS 7 days     ASSESSMENT / PLAN: 81 year old male with history of stage IIIa non-small cell lung cancer on the right as well as right sided radiation pneumonitis and spontaneous right pneumothorax in October 2016. Initially transitioned to the intensive care unit on 12/3 for worsening respiratory status in the setting of acute spontaneous left pneumothorax. The patient does have a mild COPD exacerbation as well. Questionable right lower lobe opacity suggestive of healthcare associated pneumonia with known underlying right-sided radiation pneumonitis. Respiratory status is continually improving. Patient appears significantly more comfortable in terms of his breathing today. Chest pain has completely resolved as well. Pneumothorax is mostly anterior and unable to be visualized on portable chest x-ray but repeat imaging has shown no evidence of progression of patient's pneumothorax and given his improving respiratory status I have not reordered a CT scan of the chest at this time. Palliative Medicine met with patient and family yesterday.patient has no signs of volume  overload today and has diuresed well with Lasix IV twice a day.   1. Small left pneumothorax: Predominantly anterior. Spontaneous. resolved on cxr 09/10/15 and 09/13/15   PLAN: repeat cxr 09/13/15 and if stable opd fu. If recurs needs VATS pleurodesis. > 30% risk for recurrence next 12 months given copd   2. COPD exacerbation (reported on chronic steroids pred 70m  per RN): On Solu-Medrol 40 mg IV every 6 hours & DuoNebs every 4 hours. Clinically improving.  PLAN: change to prednisone.  3. Acute on chronic hypoxic respiratory failure:  Off lasix. Appears baseline  Continue o2 for pusle ox > 88%  Check ABG 09/13/15 to assess if near baseline 4. Coagulopathy with hemoptysis: Resolved. Secondary to heparin & Xarelto.   Continuing to hold systemic anticoagulation given the patient's hemoptysis & acute pneumothorax. 5. Possible right lower lobe healthcare associated pneumonia:  Leukocytosis improving. On  Zosyn #5/7. QTc < 5035mc on 09/27/2015 abx per [facility]   6. Acute delirium: Resolved. Likely secondary to hypercapnia in the setting of COPD exacerbation  Monitor. 7.   Atrial fibrillation:     Cardiology following. Holding systemic anticoagulation given hemoptysis. 7. Acute renal failure: Resolved.  Continuing to monitor renal function with urine output & trending BUN/creatinine daily. 8. Stage IIIa non-small cell lung cancer: Status post treatment & initially diagnosed February 2016. Followed by Dr. Julien Nordmann.  9. NEW ISSUE - RLE warmth  Plan; Get LE duplex - stil not done. Re-ordered - [facility] To fllow   Appears moving tw palliation   PCCM will sign off  Dr. Brand Males, M.D., [facility].C.P Pulmonary and Critical Care Medicine Staff Physician [facility] Pager: [phone], If no answer or between  15:00h - 7:00h: call [phone]  09/13/2015 12:36 PM

## 2015-09-13 NOTE — Care Management Important Message (Signed)
Important Message  Patient Details  Name: [name] MRN: [id number] Date of Birth: [birth year]   Medicare Important Message Given:  Yes    [name] May 09/13/2015, 2:03 PM

## 2015-09-13 NOTE — Progress Notes (Signed)
Physical Therapy Treatment Patient Details Name: [name] MRN: [id number] DOB: 1932/12/05 Today's Date: 09/13/2015    History of Present Illness This is an 81 year old male patient known chronic hypoxemic respiratory failure secondary to COPD on 3 L oxygen, no non-small cell lung cancer who completed chemotherapy and radiation in May 2016, Recent admission for spontaneous pneumothorax on 11/5, atrial fibrillation on Xaralto, known thoracic ascending aortic aneurysm measuring 4.3 cm as of febrile 2016 CT, abnormal TSH and August 2016, history of radiation pneumonitis, known CAD and GERD.  Patient admitted with abrupt onset of chest pain.    PT Comments    Pt presenting with progressive weakness. Unable to ambulate today. During sit to stand from [facility], IV dressing caused a skin tear. Safety zone completed. Pt is requiring extensive assistance and demo poor activity tolerance. Recommend SNF at d/c, but pt is refusing.    Follow Up Recommendations  Supervision/Assistance - 24 hour;Home health PT     Equipment Recommendations  Hospital bed    Recommendations for Other Services       Precautions / Restrictions Precautions Precautions: Fall    Mobility  Bed Mobility         Supine to sit: Min assist Sit to supine: Mod assist      Transfers   Equipment used: None Transfers: Stand Pivot Transfers Sit to Stand: Mod assist Stand pivot transfers: Mod assist       General transfer comment: Knees blocked to prevent buckling with transfer. Pivot transfer toward left. Pt reporting weakness R>L.  Ambulation/Gait                 Stairs            Wheelchair Mobility    Modified Rankin (Stroke Patients Only)       Balance                                    Cognition Arousal/Alertness: Awake/alert Behavior During Therapy: WFL for tasks assessed/performed Overall Cognitive Status: Within Functional Limits for tasks assessed                      Exercises General Exercises - Lower Extremity Ankle Circles/Pumps: AROM;Both;10 reps;Supine Quad Sets: AROM;Both;10 reps;Supine Gluteal Sets: AROM;Both;10 reps;Supine Short Arc Quad: AROM;Right;Left;10 reps;Supine Long Arc Quad: AROM;Right;Left;10 reps;Seated Heel Slides: AROM;Right;Left;10 reps;Supine Hip ABduction/ADduction: AROM;Right;Left;10 reps;Supine    General Comments        Pertinent Vitals/Pain Pain Assessment: Faces Faces Pain Scale: Hurts even more Pain Location: R shoulder with mobility and weight bear Pain Descriptors / Indicators: Grimacing;Guarding Pain Intervention(s): Monitored during session    Home Living                      Prior Function            PT Goals (current goals can now be found in the care plan section) Acute Rehab PT Goals Patient Stated Goal: To go home PT Goal Formulation: With patient Time For Goal Achievement: 09/25/15 Potential to Achieve Goals: Fair Progress towards PT goals: Progressing toward goals    Frequency  Min 3X/week    PT Plan Current plan remains appropriate    Co-evaluation             End of Session Equipment Utilized During Treatment: Gait belt Activity Tolerance: Patient limited by fatigue Patient left: in bed;with  nursing/sitter in room;with call bell/phone within reach;with family/visitor present     Time: 0930-1017 PT Time Calculation (min) (ACUTE ONLY): 47 min  Charges:  $Therapeutic Exercise: 8-22 mins $Therapeutic Activity: 23-37 mins                    G Codes:      Lorriane Shire 09/13/2015, 11:02 AM

## 2015-09-13 NOTE — Progress Notes (Signed)
Daily Progress Note   Patient Name: [name]       Date: 09/13/2015 DOB: 10/16/1932  Age: 81 y.o. MRN#: [id number] Attending Physician: Allie Bossier, MD Primary Care Physician: [name], MD Admit Date: 09/14/2015  Reason for Consultation/Follow-up: Establishing goals of care  Subjective: Mr. Saccente today is much unchanged from yesterday. We talked about if this is as good as it gets. He tells me "then I'm going to sue the doctor." I was confused and explained that he has a disease we would expect him to progress and worsen over time. He further tells me that he regrets doing radiation as he feels this has made things worse for his lungs. I acknowledge this with him. He talked more about what his goals are if this were to get worse or no better. He tells me "I want to live as long as I can." I further introduced concept of QOL and he is unsure about how this fits in for him with how aggressive he wants to be with his care. He is resistant to considering SNF rehab. I will follow up.    Length of Stay: 7 days  Current Medications: Scheduled Meds:  . budesonide-formoterol  2 puff Inhalation BID  . colchicine  0.6 mg Oral Daily  . dextromethorphan-guaiFENesin  2 tablet Oral BID  . digoxin  0.125 mg Oral Daily  . diltiazem  180 mg Oral Q12H  . ipratropium-albuterol  3 mL Nebulization QID  . levofloxacin  500 mg Oral Daily  . nicotine  7 mg Transdermal Daily  . pantoprazole  40 mg Oral QHS  . predniSONE  50 mg Oral Q breakfast  . tiotropium  18 mcg Inhalation Daily  . triamcinolone cream   Topical TID  . white petrolatum        Continuous Infusions:    PRN Meds: acetaminophen, albuterol, ondansetron (ZOFRAN) IV, polyethylene glycol, traMADol, zolpidem  Physical  Exam: Physical Exam  Constitutional: He is oriented to person, place, and time. He appears well-developed.  HENT:  Head: Normocephalic.  Cardiovascular: Normal rate.   Pulmonary/Chest: Effort normal.  Cough  Neurological: He is alert and oriented to person, place, and time.                Vital Signs: BP 135/75 mmHg  Pulse  97  Temp(Src) 97.9 F (36.6 C) (Oral)  Resp 23  Ht '5\' 10"'$  (1.778 m)  Wt 82.192 kg (181 lb 3.2 oz)  BMI 26.00 kg/m2  SpO2 95% SpO2: SpO2: 95 % O2 Device: O2 Device: Nasal Cannula O2 Flow Rate: O2 Flow Rate (L/min): 6 L/min  Intake/output summary:   Intake/Output Summary (Last 24 hours) at 09/13/15 1141 Last data filed at 09/13/15 0600  Gross per 24 hour  Intake    240 ml  Output   1325 ml  Net  -1085 ml   LBM: Last BM Date: 09/12/15 Baseline Weight: Weight: 83.915 kg (185 lb) (pt reported) Most recent weight: Weight: 82.192 kg (181 lb 3.2 oz)       Palliative Assessment/Data: Flowsheet Rows        Most Recent Value   Intake Tab    Referral Department  Critical care   Unit at Time of Referral  ICU   Palliative Care Primary Diagnosis  Cancer   Date Notified  09/10/15   Palliative Care Type  New Palliative care   Reason for referral  Clarify Goals of Care   Date of Admission  09/23/2015   # of days IP prior to Palliative referral  4   Clinical Assessment    Psychosocial & Spiritual Assessment    Palliative Care Outcomes       Additional Data Reviewed: CBC    Component Value Date/Time   WBC 9.6 09/13/2015 0416   WBC 10.3 06/25/2015 0941   RBC 3.81* 09/13/2015 0416   RBC 4.54 06/25/2015 0941   HGB 9.9* 09/13/2015 0416   HGB 11.3* 06/25/2015 0941   HCT 33.6* 09/13/2015 0416   HCT 36.1* 06/25/2015 0941   PLT 132* 09/13/2015 0416   PLT 181 06/25/2015 0941   MCV 88.2 09/13/2015 0416   MCV 79.7 06/25/2015 0941   MCH 26.0 09/13/2015 0416   MCH 24.9* 06/25/2015 0941   MCHC 29.5* 09/13/2015 0416   MCHC 31.3* 06/25/2015 0941   RDW 17.5*  09/13/2015 0416   RDW 18.7* 06/25/2015 0941   LYMPHSABS 0.5* 09/13/2015 0416   LYMPHSABS 0.5* 06/25/2015 0941   MONOABS 0.3 09/13/2015 0416   MONOABS 0.6 06/25/2015 0941   EOSABS 0.0 09/13/2015 0416   EOSABS 0.1 06/25/2015 0941   BASOSABS 0.0 09/13/2015 0416   BASOSABS 0.0 06/25/2015 0941    CMP     Component Value Date/Time   NA 140 09/13/2015 0416   NA 139 06/25/2015 0941   K 3.7 09/13/2015 0416   K 4.0 06/25/2015 0941   CL 97* 09/13/2015 0416   CO2 36* 09/13/2015 0416   CO2 29 06/25/2015 0941   GLUCOSE 131* 09/13/2015 0416   GLUCOSE 96 06/25/2015 0941   BUN 31* 09/13/2015 0416   BUN 28.7* 06/25/2015 0941   CREATININE 0.85 09/13/2015 0416   CREATININE 1.2 06/25/2015 0941   CALCIUM 9.1 09/13/2015 0416   CALCIUM 9.4 06/25/2015 0941   PROT 6.4 06/25/2015 0941   PROT 6.5 05/15/2015 1700   ALBUMIN 2.6* 09/13/2015 0416   ALBUMIN 3.1* 06/25/2015 0941   AST 12 06/25/2015 0941   AST 22 05/15/2015 1700   ALT 17 06/25/2015 0941   ALT 10* 05/15/2015 1700   ALKPHOS 71 06/25/2015 0941   ALKPHOS 92 05/15/2015 1700   BILITOT 0.32 06/25/2015 0941   BILITOT 0.5 05/15/2015 1700   GFRNONAA >60 09/13/2015 0416   GFRAA >60 09/13/2015 0416       Problem List:  Patient Active Problem List  Diagnosis Date Noted  . Palliative care encounter   . Non-small cell lung cancer (NSCLC) ([place])   . Pneumothorax on left   . COPD exacerbation ([place])   . Acute delirium   . Pleuritic chest pain 09/12/2015  . Chronic respiratory failure with hypoxia ([place]) 09/15/2015  . COPD (chronic obstructive pulmonary disease) ([place])   . History of pneumothorax   . Radiation pneumonitis ([place])   . Pleural effusion 05/31/2015  . CAD (coronary artery disease), native coronary artery 12/03/2014  . Thoracic ascending aortic aneurysm ([place]) 12/03/2014  . Current use of long term anticoagulation   . SCC (squamous cell carcinoma of lung) ([place])   . History of tobacco abuse 11/25/2014  . Chronic atrial  fibrillation [facility])      Palliative Care Assessment & Plan    1.Code Status:  DNR    Code Status Orders        Start     Ordered   09/09/2015 1242  Do not attempt resuscitation (DNR)   Continuous    Question Answer Comment  In the event of cardiac or respiratory ARREST Do not call a "code blue"   In the event of cardiac or respiratory ARREST Do not perform Intubation, CPR, defibrillation or ACLS   In the event of cardiac or respiratory ARREST Use medication by any route, position, wound care, and other measures to relive pain and suffering. May use oxygen, suction and manual treatment of airway obstruction as needed for comfort.      09/26/2015 1241       2. Goals of Care/Additional Recommendations:  Treat the treatable. Appears that aggressive treatment is not his wish. No further chemotherapy.  Limitations on Scope of Treatment: No Chemotherapy or radiation  Desire for further Chaplaincy support:no  Psycho-social Needs: Caregiving  Support/Resources  3. Symptom Management:  Constipation: Relieved. Miralax ordered prn.   SOB: Per pulmonology. Continue oxygen.  Weakness: PT consult.  Bilat swelling in feet: Wishes to minimize steroids. Encouraged exercises in bed. TED hose.   4. Palliative Prophylaxis:   Bowel Regimen and Delirium Protocol  5. Prognosis: Unable to determine  6. Discharge Planning:  To be determined.    Thank you for allowing the Palliative Medicine Team to assist in the care of this patient.   Time In: 1100 Time Out: 1130 Total Time 42mn Prolonged Time Billed  no         APershing Proud NP  135/02/7321 11:41 AM  Please contact Palliative Medicine Team phone at [phone] questions and concerns.

## 2015-09-14 ENCOUNTER — Encounter (HOSPITAL_COMMUNITY): Payer: Self-pay | Admitting: *Deleted

## 2015-09-14 DIAGNOSIS — C3490 Malignant neoplasm of unspecified part of unspecified bronchus or lung: Secondary | ICD-10-CM

## 2015-09-14 DIAGNOSIS — I2510 Atherosclerotic heart disease of native coronary artery without angina pectoris: Secondary | ICD-10-CM

## 2015-09-14 DIAGNOSIS — Z515 Encounter for palliative care: Secondary | ICD-10-CM

## 2015-09-14 LAB — RENAL FUNCTION PANEL
ALBUMIN: 2.5 g/dL — AB (ref 3.5–5.0)
Anion gap: 6 (ref 5–15)
BUN: 31 mg/dL — AB (ref 6–20)
CHLORIDE: 98 mmol/L — AB (ref 101–111)
CO2: 35 mmol/L — ABNORMAL HIGH (ref 22–32)
Calcium: 9.1 mg/dL (ref 8.9–10.3)
Creatinine, Ser: 0.8 mg/dL (ref 0.61–1.24)
GFR calc Af Amer: >60 mL/min (ref >60)
GFR calc non Af Amer: >60 mL/min (ref >60)
GLUCOSE: 110 mg/dL — AB (ref 65–99)
PHOSPHORUS: 2.7 mg/dL (ref 2.5–4.6)
POTASSIUM: 3.9 mmol/L (ref 3.5–5.1)
Sodium: 139 mmol/L (ref 135–145)

## 2015-09-14 LAB — CBC WITH DIFFERENTIAL/PLATELET
Basophils Absolute: 0 10*3/uL (ref 0.0–0.1)
Basophils Relative: 0 %
Eosinophils Absolute: 0 10*3/uL (ref 0.0–0.7)
Eosinophils Relative: 0 %
HEMATOCRIT: 33.8 % — AB (ref 39.0–52.0)
Hemoglobin: 10.2 g/dL — ABNORMAL LOW (ref 13.0–17.0)
LYMPHS PCT: 6 %
Lymphs Abs: 0.6 10*3/uL — ABNORMAL LOW (ref 0.7–4.0)
MCH: 26.7 pg (ref 26.0–34.0)
MCHC: 30.2 g/dL (ref 30.0–36.0)
MCV: 88.5 fL (ref 78.0–100.0)
MONO ABS: 0.3 10*3/uL (ref 0.1–1.0)
MONOS PCT: 3 %
NEUTROS ABS: 9.6 10*3/uL — AB (ref 1.7–7.7)
Neutrophils Relative %: 91 %
Platelets: 132 10*3/uL — ABNORMAL LOW (ref 150–400)
RBC: 3.82 MIL/uL — ABNORMAL LOW (ref 4.22–5.81)
RDW: 17.5 % — AB (ref 11.5–15.5)
WBC: 10.6 10*3/uL — ABNORMAL HIGH (ref 4.0–10.5)

## 2015-09-14 LAB — APTT: aPTT: 23 seconds — ABNORMAL LOW (ref 24–37)

## 2015-09-14 LAB — MAGNESIUM: Magnesium: 2.7 mg/dL — ABNORMAL HIGH (ref 1.7–2.4)

## 2015-09-14 LAB — PROTIME-INR
INR: 1.08 (ref 0.00–1.49)
Prothrombin Time: 14.2 seconds (ref 11.6–15.2)

## 2015-09-14 MED ORDER — ALBUTEROL SULFATE (2.5 MG/3ML) 0.083% IN NEBU
2.5000 mg | INHALATION_SOLUTION | RESPIRATORY_TRACT | Status: DC | PRN
  Administered 2015-09-14 – 2015-09-16 (×3): 2.5 mg via RESPIRATORY_TRACT
  Filled 2015-09-14 (×3): qty 3

## 2015-09-14 MED ORDER — METHYLPREDNISOLONE SODIUM SUCC 125 MG IJ SOLR
60.0000 mg | Freq: Two times a day (BID) | INTRAMUSCULAR | Status: DC
  Administered 2015-09-14 – 2015-09-15 (×3): 60 mg via INTRAVENOUS
  Filled 2015-09-14 (×4): qty 2

## 2015-09-14 NOTE — Care Management Note (Signed)
Case Management Note  Patient Details  Name: [name] MRN: [id number] Date of Birth: [birth year]  Subjective/Objective:     Pt is now stating he wants to go to SNF for rehab before returning home because family states they are unable to provide the assistance he will need.  Pt is requesting [facility].  Will notify CSW.                      Expected Discharge Plan:  [place]  In-House Referral:  Clinical Social Work  Discharge planning Services  CM Consult  Status of Service:  Completed, signed off  Medicare Important Message Given:  Yes  Girard Cooter, RN 09/14/2015, 3:27 PM

## 2015-09-14 NOTE — Clinical Social Work Placement (Signed)
   CLINICAL SOCIAL WORK PLACEMENT  NOTE  Date:  09/14/2015  Patient Details  Name: [name] MRN: [id number] Date of Birth: 04-Mar-1933  Clinical Social Work is seeking post-discharge placement for this patient at the [place] level of care (*CSW will initial, date and re-position this form in  chart as items are completed):  Yes   Patient/family provided with [facility] Work Department's list of facilities offering this level of care within the geographic area requested by the patient (or if unable, by the patient's family).  Yes   Patient/family informed of their freedom to choose among providers that offer the needed level of care, that participate in Medicare, Medicaid or managed care program needed by the patient, have an available bed and are willing to accept the patient.  Yes   Patient/family informed of [facility]'s ownership interest in [facility] and [facility], as well as of the fact that they are under no obligation to receive care at these facilities.  PASRR submitted to EDS on 09/14/15     PASRR number received on 09/14/15     Existing PASRR number confirmed on       FL2 transmitted to all facilities in geographic area requested by pt/family on       FL2 transmitted to all facilities within larger geographic area on       Patient informed that his/her managed care company has contracts with or will negotiate with certain facilities, including the following:            Patient/family informed of bed offers received.  Patient chooses bed at       Physician recommends and patient chooses bed at      Patient to be transferred to   on  .  Patient to be transferred to facility by       Patient family notified on   of transfer.  Name of family member notified:        PHYSICIAN Please sign FL2, Please sign DNR     Additional Comment:    _______________________________________________ [name],  LCSW 09/14/2015, 5:53 PM

## 2015-09-14 NOTE — Progress Notes (Signed)
Spoke with patient's daughter, [name], and was told she would like to speak with the doctor about the patient's condition. There is some disconnect between the family, patient, and doctor/staff about where the patient will go after discharge. Daughter states that her father has been agreeable to rehab after discharge to help him regain some strength; however, patient tells doctor he wants to go home. Please call her with an update.  She can be reached at [phone]. Thank you. Milford Cage, RN

## 2015-09-14 NOTE — Progress Notes (Signed)
RT Note: Pt changed to Venti mask. Upon arrival Pt was maintaining SpO2 91% while mouth breathing. Following aerosol treatment I switched patient to 45% Venti-mask. PT says that he a little better, better flow with mask. RT will continue to monitor. Will inform RN.

## 2015-09-14 NOTE — Progress Notes (Signed)
[place] TEAM 1 - Stepdown/ICU TEAM PROGRESS NOTE  GRIFFIN GERRARD ACZ:[id number] DOB: 1932-11-25 DOA: 10/05/2015 PCP: [name], MD  Admit HPI / Brief Narrative: 82 year old male patient known chronic hypoxemic respiratory failure secondary to COPD on 3 L oxygen, non-small cell lung cancer who completed chemotherapy and radiation in May 2016, and a recent admission for spontaneous pneumothorax on 11/5 who presented to the ER with abrupt onset of left sided anterior pleuritic-type chest pain.  Echocardiogram noted only a small pericardial effusion.   After admission the patient was noted to have increased work of breathing and rapid response team was called. Patient complained of shortness of breath and also coughing up blood. CT thorax was done which showed 15% pneumothorax on the left.   Significant Events: 12/01- Admit to [facility] 12/02 - New Left Ptx - CT Chest: Small anterior left pneumothorax. Changes in right lung consistent with prior radiation pneumonitis. 12/03 CT Chest: Slight reduction in size of small left anterior pneumothorax. Mild consolidation RLL 12/03 - PCCM consulted & transfered to ICU 12/04 Port CXR: No obvious pneumothorax. RLL opacity persists. 12/05 Port CXR: No obvious pneumothorax. RLL opacity unchanged. 12/06 Transfer to SDU.   HPI/Subjective: The patient states he is feeling very short of breath at the present time.  He denies chest pain nausea vomiting or abdominal pain.  He states that he has not significantly improved over the course of his hospital stay thus far.  In meeting with palliative care today however he did state he wishes to continue active medical care and is hopeful for improvement.  His goal is to prolong his life as long as possible.  Assessment/Plan:  Small left pneumothorax Care per PCCM as follows:  "repeat cxr 09/13/15 and if stable opd fu. If recurs needs VATS pleurodesis" - no PTX was seen on f/u CXR 12/8  COPD  exacerbation Slow to improve per pt - increase frequency of prn neb - follow   New R LE warmth  No evidence of DVT on venous duplex - leg is not warm today, but here is unilateral R LE edema   R leg weakness Pt is worried he may have suffered a CVA at the time of his admit - imaging would not change our tx options at this stage  - follow exam - cont medical care   Acute on chronic hypoxic respiratory failure baseline oxygen requirement 3 L/m at home - remains dependent upon 6L at present - wean as able    Coagulopathy w/ Hemoptysis Resolved - secondary to heparin & Xarelto - now off systemic anticoagulation given the patient's hemoptysis & acute pneumothorax  Possible right lower lobe healthcare associated pneumonia To complete his course of abx tx today   Acute delirium Resolved - likely secondary to hypercapnia in the setting of COPD exacerbation.  Atrial fibrillation Cardiology had been following - holding systemic anticoagulation given hemoptysis - HR controlled   Acute renal failure Resolved - crt remains normal   Stage IIIa non-small cell lung cancer Status post treatment w/ chemo and XRT - initially diagnosed February 2016 - followed by Dr. Julien Nordmann - pt does not desire any further CA tx, but is not yet full comfort care only   Anemia Stable - no signs of active bleeding at this time   Thrombocytopenia Mild - plt count stable   History of hyperthyroidism TSH normal on 12/1  History of coronary artery disease Denies current cp  Nicotine dependence continuing nicotine patch   Code Status:  NO CODE BLUE - DNR but still requires active care short of this  Family Communication: no family present at time of exam today  Disposition Plan: SDU - eventual d/c to SNF for a rehab stay, w/ ultimate desire for eventual d/c home   Consultants: PCCM Cardiology - Wynonia Lawman  Antibiotics: levaquin 12/7 >  DVT prophylaxis: SCDs  Objective: Blood pressure 117/75, pulse 98,  temperature 98.2 F (36.8 C), temperature source Oral, resp. rate 24, height '5\' 10"'$  (1.778 m), weight 82.192 kg (181 lb 3.2 oz), SpO2 99 %.  Intake/Output Summary (Last 24 hours) at 09/14/15 1408 Last data filed at 09/14/15 0400  Gross per 24 hour  Intake      0 ml  Output    650 ml  Net   -650 ml   Exam: General: mild resp distress - feeling sob at rest in bed  Lungs: poor air movement th/o - no active wheeze - course crackles scattered th/o  Cardiovascular: Regular rate without murmur gallop or rub  Abdomen: Nontender, nondistended, soft, bowel sounds positive, no rebound, no ascites, no appreciable mass Extremities: No significant cyanosis, or clubbing;  1+ edema R LE - no edema L LE   Data Reviewed: Basic Metabolic Panel:  Recent Labs Lab 09/10/15 0205 09/11/15 0228 09/12/15 0249 09/13/15 0416 09/14/15 0256  NA 138 141 140 140 139  K 3.4* 3.7 3.5 3.7 3.9  CL 98* 99* 98* 97* 98*  CO2 32 35* 33* 36* 35*  GLUCOSE 157* 165* 146* 131* 110*  BUN 35* 40* 41* 31* 31*  CREATININE 1.22 1.26* 1.11 0.85 0.80  CALCIUM 8.6* 8.7* 8.8* 9.1 9.1  MG 2.0 2.1 2.0 2.4 2.7*  PHOS 3.7 3.1 2.6 2.7 2.7    CBC:  Recent Labs Lab 09/10/15 0205 09/11/15 0228 09/12/15 0249 09/13/15 0416 09/14/15 0256  WBC 13.2* 11.2* 8.7 9.6 10.6*  NEUTROABS 12.5* 10.7* 8.3* 8.8* 9.6*  HGB 9.7* 9.7* 9.5* 9.9* 10.2*  HCT 31.7* 32.6* 30.8* 33.6* 33.8*  MCV 85.9 88.1 87.7 88.2 88.5  PLT 132* 125* 120* 132* 132*    Liver Function Tests:  Recent Labs Lab 09/10/15 0205 09/11/15 0228 09/12/15 0249 09/13/15 0416 09/14/15 0256  ALBUMIN 2.5* 2.4* 2.3* 2.6* 2.5*    Coags:  Recent Labs Lab 09/10/15 0205 09/11/15 0228 09/12/15 0249 09/13/15 0416 09/14/15 0256  INR 1.31 1.11 1.09 1.08 1.08    Recent Labs Lab 09/10/15 0205 09/11/15 0228 09/12/15 0249 09/13/15 0416 09/14/15 0256  APTT '28 25 24 '$ 22* 23*   CBG:  Recent Labs Lab 09/08/15 1549  GLUCAP 92    Recent Results (from the  past 240 hour(s))  MRSA PCR Screening     Status: None   Collection Time: 09/08/15  2:59 PM  Result Value Ref Range Status   MRSA by PCR NEGATIVE NEGATIVE Final    Comment:        The GeneXpert MRSA Assay (FDA approved for NASAL specimens only), is one component of a comprehensive MRSA colonization surveillance program. It is not intended to diagnose MRSA infection nor to guide or monitor treatment for MRSA infections.   Culture, expectorated sputum-assessment     Status: None   Collection Time: 09/13/15 11:00 AM  Result Value Ref Range Status   Specimen Description SPUTUM  Final   Special Requests Normal  Final   Sputum evaluation   Final    THIS SPECIMEN IS ACCEPTABLE. RESPIRATORY CULTURE REPORT TO FOLLOW.   Report Status 09/13/2015 FINAL  Final  Culture, respiratory (NON-Expectorated)     Status: None (Preliminary result)   Collection Time: 09/13/15 11:00 AM  Result Value Ref Range Status   Specimen Description SPUTUM  Final   Special Requests NONE  Final   Gram Stain   Final    FEW WBC PRESENT, PREDOMINANTLY PMN RARE SQUAMOUS EPITHELIAL CELLS PRESENT RARE YEAST RARE GRAM POSITIVE COCCI IN PAIRS    Culture   Final    Culture reincubated for better growth Performed at [facility]    Report Status PENDING  Incomplete     Studies:   Recent x-ray studies have been reviewed in detail by the Attending Physician  Scheduled Meds:  Scheduled Meds: . budesonide-formoterol  2 puff Inhalation BID  . colchicine  0.6 mg Oral Daily  . dextromethorphan-guaiFENesin  2 tablet Oral BID  . digoxin  0.125 mg Oral Daily  . diltiazem  180 mg Oral Q12H  . feeding supplement (ENSURE ENLIVE)  237 mL Oral BID BM  . ipratropium-albuterol  3 mL Nebulization QID  . pantoprazole  40 mg Oral QHS  . predniSONE  50 mg Oral Q breakfast  . tiotropium  18 mcg Inhalation Daily  . triamcinolone cream   Topical TID    Time spent on care of this patient: 35 mins     MCCLUNG,JEFFREY T , MD   [facility] Office  [phone] Pager - Text Page per Shea Evans as per below:  On-Call/Text Page:      [email or web address]      password [email or web address]  If 7PM-7AM, please contact night-coverage [email or web address] Password [email or web address] 09/14/2015, 2:08 PM   LOS: 8 days

## 2015-09-14 NOTE — NC FL2 (Signed)
[place] LEVEL OF CARE SCREENING TOOL     IDENTIFICATION  Patient Name: [name] Birthdate: 01-23-1933 Sex: male Admission Date (Current Location): 09/24/2015  [facility] and [place] Number: Herbalist and Address:  [facility] [place], [place] [address] [place], [place] [address]      Provider Number: [id number]  Attending Physician Name and Address:  Cherene Altes, MD  Relative Name and Phone Number:       Current Level of Care: Hospital Recommended Level of Care: [place] Prior Approval Number:    Date Approved/Denied:   PASRR Number: [id number]  Discharge Plan: SNF    Current Diagnoses: Patient Active Problem List   Diagnosis Date Noted  . Acute on chronic respiratory failure with hypoxia ([place])   . Metabolic alkalosis   . Pneumothorax, left   . Hemoptysis   . HCAP (healthcare-associated pneumonia)   . Tobacco abuse   . Palliative care encounter   . Non-small cell lung cancer (NSCLC) ([place])   . Pneumothorax on left   . COPD exacerbation ([place])   . Acute delirium   . Pleuritic chest pain 09/11/2015  . Chronic respiratory failure with hypoxia ([place]) 09/17/2015  . COPD (chronic obstructive pulmonary disease) ([place])   . History of pneumothorax   . Radiation pneumonitis ([place])   . Pleural effusion 05/31/2015  . CAD (coronary artery disease), native coronary artery 12/03/2014  . Thoracic ascending aortic aneurysm ([place]) 12/03/2014  . Current use of long term anticoagulation   . SCC (squamous cell carcinoma of lung) ([place])   . History of tobacco abuse 11/25/2014  . Chronic atrial fibrillation (HCC)     Orientation RESPIRATION BLADDER Height & Weight    Self, Time, Situation, Place  O2 (6L [place]) Incontinent '5\' 10"'$  (177.8 cm) 181 lbs.  BEHAVIORAL SYMPTOMS/MOOD NEUROLOGICAL BOWEL NUTRITION STATUS      Continent Diet (regular)  AMBULATORY STATUS COMMUNICATION OF NEEDS Skin   Extensive Assist Verbally  Surgical wounds                       Personal Care Assistance Level of Assistance  Bathing, Dressing Bathing Assistance: Maximum assistance   Dressing Assistance: Maximum assistance     Functional Limitations Info             SPECIAL CARE FACTORS FREQUENCY  PT (By licensed PT)     PT Frequency: 5/wk              Contractures      Additional Factors Info  Code Status, Allergies Code Status Info: DNR Allergies Info: [place]           Current Medications (09/14/2015):  This is the current hospital active medication list Current Facility-Administered Medications  Medication Dose Route Frequency Provider Last Rate Last Dose  . acetaminophen (TYLENOL) tablet 650 mg  650 mg Oral Q4H PRN Samella Parr, NP      . albuterol (PROVENTIL) (2.5 MG/3ML) 0.083% nebulizer solution 2.5 mg  2.5 mg Inhalation Q2H PRN Cherene Altes, MD      . budesonide-formoterol [facility]) 160-4.5 MCG/ACT inhaler 2 puff  2 puff Inhalation BID Jeryl Columbia, NP   2 puff at 09/14/15 0816  . colchicine tablet 0.6 mg  0.6 mg Oral Daily Jacolyn Reedy, MD   0.6 mg at 09/14/15 1032  . dextromethorphan-guaiFENesin (MUCINEX DM) 30-600 MG per 12 hr tablet 2 tablet  2 tablet Oral BID  Donita Brooks, NP   2 tablet at 09/14/15 1033  . digoxin (LANOXIN) tablet 0.125 mg  0.125 mg Oral Daily Samella Parr, NP   0.125 mg at 09/14/15 1032  . diltiazem (CARDIZEM) tablet 180 mg  180 mg Oral Q12H Oswald Hillock, MD   180 mg at 09/14/15 1033  . feeding supplement (ENSURE ENLIVE) (ENSURE ENLIVE) liquid 237 mL  237 mL Oral BID BM Allie Bossier, MD   237 mL at 09/14/15 1505  . ipratropium-albuterol (DUONEB) 0.5-2.5 (3) MG/3ML nebulizer solution 3 mL  3 mL Nebulization QID Javier Glazier, MD   3 mL at 09/14/15 1556  . methylPREDNISolone sodium succinate (SOLU-MEDROL) 125 mg/2 mL injection 60 mg  60 mg Intravenous Q12H Cherene Altes, MD   60 mg at 09/14/15 1701  . ondansetron (ZOFRAN) injection 4 mg   4 mg Intravenous Q6H PRN Samella Parr, NP   4 mg at 09/09/15 1956  . pantoprazole (PROTONIX) EC tablet 40 mg  40 mg Oral QHS Eudelia Bunch, RPH   40 mg at 09/13/15 2012  . polyethylene glycol (MIRALAX / GLYCOLAX) packet 17 g  17 g Oral Daily PRN Pershing Proud, NP      . tiotropium [facility]) inhalation capsule 18 mcg  18 mcg Inhalation Daily Rhetta Mura Schorr, NP   18 mcg at 09/14/15 0817  . traMADol (ULTRAM) tablet 50 mg  50 mg Oral Q6H PRN Samella Parr, NP   50 mg at 09/09/15 2232  . triamcinolone cream (KENALOG) 0.1 %   Topical TID Samella Parr, NP      . zolpidem [facility]) tablet 5 mg  5 mg Oral QHS PRN Juanito Doom, MD   5 mg at 09/13/15 2337     Discharge Medications: Please see discharge summary for a list of discharge medications.  Relevant Imaging Results:  Relevant Lab Results:   Additional Information SS#: [id number]  [name], [place]

## 2015-09-14 NOTE — Progress Notes (Signed)
Daily Progress Note   Patient Name: [name]       Date: 09/14/2015 DOB: June 28, 1933  Age: 81 y.o. MRN#: [id number] Attending Physician: Cherene Altes, MD Primary Care Physician: [name], MD Admit Date: 09/07/2015  Reason for Consultation/Follow-up: Establishing goals of care  Subjective: [name] appears much unchanged from when we first met - has not really made much improvement. Still requiring 6L oxygen and congested and unable to cough much up. Very weak. We had discussion about his goals again - noted in chart discussion from Dr. Sherral Hammers yesterday. [name] acknowledges that he has not improved and we discussed what to do if this did not change or got worse - would he wish to come back to the hospital even if we believe there is nothing more we can do to fix him? He tells me YES he would want to come to the hospital so "we can try" and agrees if this does not help we would cross that bridge then. Tells me he is scared unless he begins to get better "I'm not going to make it through Christmas." He says he is not ready to leave his family yet but is not scared to die - just not ready.   We discussed the idea of rehab again and he tells me "I believe I have too don't I?" He says he is open to rehab as he now recognizes that it is not safe for him to go home and if he truly wants everything possible to try and improve this is the next step. Family cannot offer 24/7 care which is needed for him to return safely home.   Also tells me that his right leg is very weak and "I believe I've had a right side stroke." Discussed this and will leave up to primary attending team any work up they would like to do. I did tell him there is likely little we can do if he has had a stroke at  this point. I will discuss with Dr. Thereasa Solo again.  - He wants continued treatment and still hopeful for some improvement and more time. He says he will return to hospital in future. - CSW consult for SNF rehab. - He is concerned he may have had a stroke.  - Discussed with daughter Rod Holler via  phone.     Length of Stay: 8 days  Current Medications: Scheduled Meds:  . budesonide-formoterol  2 puff Inhalation BID  . colchicine  0.6 mg Oral Daily  . dextromethorphan-guaiFENesin  2 tablet Oral BID  . digoxin  0.125 mg Oral Daily  . diltiazem  180 mg Oral Q12H  . feeding supplement (ENSURE ENLIVE)  237 mL Oral BID BM  . ipratropium-albuterol  3 mL Nebulization QID  . pantoprazole  40 mg Oral QHS  . predniSONE  50 mg Oral Q breakfast  . tiotropium  18 mcg Inhalation Daily  . triamcinolone cream   Topical TID    Continuous Infusions:    PRN Meds: acetaminophen, albuterol, ondansetron (ZOFRAN) IV, polyethylene glycol, traMADol, zolpidem  Physical Exam: Physical Exam  Constitutional: He is oriented to person, place, and time. He appears well-developed.  HENT:  Head: Normocephalic.  Cardiovascular: Normal rate.   Pulmonary/Chest: Effort normal.  Cough  Neurological: He is alert and oriented to person, place, and time.                Vital Signs: BP 137/66 mmHg  Pulse 94  Temp(Src) 97.5 F (36.4 C) (Oral)  Resp 27  Ht $R'5\' 10"'gk$  (1.778 m)  Wt 82.192 kg (181 lb 3.2 oz)  BMI 26.00 kg/m2  SpO2 99% SpO2: SpO2: 99 % O2 Device: O2 Device: Nasal Cannula O2 Flow Rate: O2 Flow Rate (L/min): 6 L/min  Intake/output summary:   Intake/Output Summary (Last 24 hours) at 09/14/15 1215 Last data filed at 09/14/15 0400  Gross per 24 hour  Intake      0 ml  Output    650 ml  Net   -650 ml   LBM: Last BM Date: 09/13/15 Baseline Weight: Weight: 83.915 kg (185 lb) (pt reported) Most recent weight: Weight: 82.192 kg (181 lb 3.2 oz)       Palliative Assessment/Data: Flowsheet Rows         Most Recent Value   Intake Tab    Referral Department  Critical care   Unit at Time of Referral  ICU   Palliative Care Primary Diagnosis  Cancer   Date Notified  09/10/15   Palliative Care Type  New Palliative care   Reason for referral  Clarify Goals of Care   Date of Admission  09/22/2015   # of days IP prior to Palliative referral  4   Clinical Assessment    Psychosocial & Spiritual Assessment    Palliative Care Outcomes       Additional Data Reviewed: CBC    Component Value Date/Time   WBC 10.6* 09/14/2015 0256   WBC 10.3 06/25/2015 0941   RBC 3.82* 09/14/2015 0256   RBC 4.54 06/25/2015 0941   HGB 10.2* 09/14/2015 0256   HGB 11.3* 06/25/2015 0941   HCT 33.8* 09/14/2015 0256   HCT 36.1* 06/25/2015 0941   PLT 132* 09/14/2015 0256   PLT 181 06/25/2015 0941   MCV 88.5 09/14/2015 0256   MCV 79.7 06/25/2015 0941   MCH 26.7 09/14/2015 0256   MCH 24.9* 06/25/2015 0941   MCHC 30.2 09/14/2015 0256   MCHC 31.3* 06/25/2015 0941   RDW 17.5* 09/14/2015 0256   RDW 18.7* 06/25/2015 0941   LYMPHSABS 0.6* 09/14/2015 0256   LYMPHSABS 0.5* 06/25/2015 0941   MONOABS 0.3 09/14/2015 0256   MONOABS 0.6 06/25/2015 0941   EOSABS 0.0 09/14/2015 0256   EOSABS 0.1 06/25/2015 0941   BASOSABS 0.0 09/14/2015 0256   BASOSABS 0.0 06/25/2015  0941    CMP     Component Value Date/Time   NA 139 09/14/2015 0256   NA 139 06/25/2015 0941   K 3.9 09/14/2015 0256   K 4.0 06/25/2015 0941   CL 98* 09/14/2015 0256   CO2 35* 09/14/2015 0256   CO2 29 06/25/2015 0941   GLUCOSE 110* 09/14/2015 0256   GLUCOSE 96 06/25/2015 0941   BUN 31* 09/14/2015 0256   BUN 28.7* 06/25/2015 0941   CREATININE 0.80 09/14/2015 0256   CREATININE 1.2 06/25/2015 0941   CALCIUM 9.1 09/14/2015 0256   CALCIUM 9.4 06/25/2015 0941   PROT 6.4 06/25/2015 0941   PROT 6.5 05/15/2015 1700   ALBUMIN 2.5* 09/14/2015 0256   ALBUMIN 3.1* 06/25/2015 0941   AST 12 06/25/2015 0941   AST 22 05/15/2015 1700   ALT 17 06/25/2015 0941    ALT 10* 05/15/2015 1700   ALKPHOS 71 06/25/2015 0941   ALKPHOS 92 05/15/2015 1700   BILITOT 0.32 06/25/2015 0941   BILITOT 0.5 05/15/2015 1700   GFRNONAA >60 09/14/2015 0256   GFRAA >60 09/14/2015 0256       Problem List:  Patient Active Problem List   Diagnosis Date Noted  . Acute on chronic respiratory failure with hypoxia ([place])   . Metabolic alkalosis   . Pneumothorax, left   . Hemoptysis   . HCAP (healthcare-associated pneumonia)   . Tobacco abuse   . Palliative care encounter   . Non-small cell lung cancer (NSCLC) ([place])   . Pneumothorax on left   . COPD exacerbation ([place])   . Acute delirium   . Pleuritic chest pain 09/23/2015  . Chronic respiratory failure with hypoxia ([place]) 09/16/2015  . COPD (chronic obstructive pulmonary disease) ([place])   . History of pneumothorax   . Radiation pneumonitis ([place])   . Pleural effusion 05/31/2015  . CAD (coronary artery disease), native coronary artery 12/03/2014  . Thoracic ascending aortic aneurysm ([place]) 12/03/2014  . Current use of long term anticoagulation   . SCC (squamous cell carcinoma of lung) ([place])   . History of tobacco abuse 11/25/2014  . Chronic atrial fibrillation [facility])      Palliative Care Assessment & Plan    1.Code Status:  DNR    Code Status Orders        Start     Ordered   09/26/2015 1242  Do not attempt resuscitation (DNR)   Continuous    Question Answer Comment  In the event of cardiac or respiratory ARREST Do not call a "code blue"   In the event of cardiac or respiratory ARREST Do not perform Intubation, CPR, defibrillation or ACLS   In the event of cardiac or respiratory ARREST Use medication by any route, position, wound care, and other measures to relive pain and suffering. May use oxygen, suction and manual treatment of airway obstruction as needed for comfort.      09/25/2015 1241       2. Goals of Care/Additional Recommendations:  Treat the treatable. Appears that aggressive treatment  is not his wish. No further chemotherapy.  Limitations on Scope of Treatment: No Chemotherapy or radiation  Desire for further Chaplaincy support:no  Psycho-social Needs: Caregiving  Support/Resources  3. Symptom Management:  Constipation: Relieved. Miralax ordered prn.   SOB: Per pulmonology. Continue oxygen.  Weakness: PT consult.  Bilat swelling in feet: Wishes to minimize steroids. Encouraged exercises in bed. TED hose.   4. Palliative Prophylaxis:   Bowel Regimen and Delirium Protocol  5. Prognosis: Unable  to determine  6. Discharge Planning:  To be determined.    Thank you for allowing the Palliative Medicine Team to assist in the care of this patient.   Time In: 1140 Time Out: 1220 Total Time 9min Prolonged Time Billed  no         Pershing Proud, NP  58/01/8349, 12:15 PM  Please contact Palliative Medicine Team phone at [phone] for questions and concerns.

## 2015-09-14 NOTE — Progress Notes (Signed)
Initial Nutrition Assessment  DOCUMENTATION CODES:   Not applicable  INTERVENTION:    Continue Ensure Enlive po BID, each supplement provides 350 kcal and 20 grams of protein  NUTRITION DIAGNOSIS:   Increased nutrient needs related to catabolic illness as evidenced by estimated needs  GOAL:   Patient will meet greater than or equal to 90% of their needs  MONITOR:   PO intake, Supplement acceptance, Labs, Weight trends, I & O's  REASON FOR ASSESSMENT:   Malnutrition Screening Tool  ASSESSMENT:   81 yo Male with chronic hypoxemic respiratory failure secondary to COPD on 3 L oxygen, no non-small cell lung cancer who completed chemotherapy and radiation in May 2016, Recent admission for spontaneous pneumothorax on 11/5, atrial fibrillation on several toe, known thoracic ascending aortic aneurysm measuring 4.3 cm as of febrile 2016 CT, abnormal TSH and August 2016, history of radiation pneumonitis, known CAD and GERD who presents to the ER with abrupt onset of left sided anterior pleuritic-type chest pain.  Pt reports his appetite is OK.  PO intake 50% per flowsheet records.  Pt states he ate a good lunch.  Nutrient needs are increased given catabolic illness (squamous cell carcinoma).  Receiving Ensure Enlive supplements.  Palliative Care Team note reviewed.  Nutrition focused physical exam completed.  No muscle or subcutaneous fat depletion noticed.  Diet Order:  Diet regular Room service appropriate?: Yes; Fluid consistency:: Thin  Skin:  Reviewed, no issues  Last BM:  12/8  Height:   Ht Readings from Last 1 Encounters:  09/11/15 '5\' 10"'$  (1.778 m)    Weight:   Wt Readings from Last 1 Encounters:  09/11/15 181 lb 3.2 oz (82.192 kg)    Ideal Body Weight:  75 kg  BMI:  Body mass index is 26 kg/(m^2).  Estimated Nutritional Needs:   Kcal:  2000-2200  Protein:  100-110 gm  Fluid:  2.0-2.2 L  EDUCATION NEEDS:   No education needs identified at this  time  Arthur Holms, RD, LDN Pager #: [phone] After-Hours Pager #: [phone]

## 2015-09-14 NOTE — Progress Notes (Signed)
Plans noted that patient wishes to be home with palliative care and/or home hospice over Christmas. Reviewed cardiology notes from Dr. Wynonia Lawman (we are covering for him as he is out of town today). No further cardiac suggestions at this time. Please contact [place] over the weekend if needed.  Pixie Casino, MD, [facility] Attending Cardiologist [place]

## 2015-09-15 ENCOUNTER — Inpatient Hospital Stay (HOSPITAL_COMMUNITY): Payer: Medicare Other

## 2015-09-15 LAB — RENAL FUNCTION PANEL
ANION GAP: 6 (ref 5–15)
Albumin: 2.4 g/dL — ABNORMAL LOW (ref 3.5–5.0)
BUN: 34 mg/dL — ABNORMAL HIGH (ref 6–20)
CALCIUM: 9 mg/dL (ref 8.9–10.3)
CO2: 34 mmol/L — AB (ref 22–32)
CREATININE: 0.83 mg/dL (ref 0.61–1.24)
Chloride: 101 mmol/L (ref 101–111)
Glucose, Bld: 133 mg/dL — ABNORMAL HIGH (ref 65–99)
Phosphorus: 3.6 mg/dL (ref 2.5–4.6)
Potassium: 4.4 mmol/L (ref 3.5–5.1)
SODIUM: 141 mmol/L (ref 135–145)

## 2015-09-15 LAB — PROTIME-INR
INR: 1.1 (ref 0.00–1.49)
Prothrombin Time: 14.4 seconds (ref 11.6–15.2)

## 2015-09-15 LAB — CBC WITH DIFFERENTIAL/PLATELET
Basophils Absolute: 0 10*3/uL (ref 0.0–0.1)
Basophils Relative: 0 %
EOS PCT: 0 %
Eosinophils Absolute: 0 10*3/uL (ref 0.0–0.7)
HEMATOCRIT: 34.7 % — AB (ref 39.0–52.0)
Hemoglobin: 10.2 g/dL — ABNORMAL LOW (ref 13.0–17.0)
LYMPHS ABS: 0.4 10*3/uL — AB (ref 0.7–4.0)
LYMPHS PCT: 3 %
MCH: 26.2 pg (ref 26.0–34.0)
MCHC: 29.4 g/dL — ABNORMAL LOW (ref 30.0–36.0)
MCV: 89 fL (ref 78.0–100.0)
MONO ABS: 0.1 10*3/uL (ref 0.1–1.0)
Monocytes Relative: 1 %
NEUTROS ABS: 10.8 10*3/uL — AB (ref 1.7–7.7)
Neutrophils Relative %: 96 %
PLATELETS: 146 10*3/uL — AB (ref 150–400)
RBC: 3.9 MIL/uL — AB (ref 4.22–5.81)
RDW: 17.6 % — AB (ref 11.5–15.5)
WBC: 11.3 10*3/uL — ABNORMAL HIGH (ref 4.0–10.5)

## 2015-09-15 LAB — APTT: APTT: 23 s — AB (ref 24–37)

## 2015-09-15 MED ORDER — CLONAZEPAM 0.5 MG PO TABS
0.2500 mg | ORAL_TABLET | Freq: Three times a day (TID) | ORAL | Status: DC
  Administered 2015-09-15 – 2015-09-16 (×3): 0.25 mg via ORAL
  Filled 2015-09-15 (×3): qty 1

## 2015-09-15 NOTE — Progress Notes (Signed)
[place] TEAM 1 - Stepdown/ICU TEAM PROGRESS NOTE  [name] JIR:[id number] DOB: 09-01-1933 DOA: 09/26/2015 PCP: [name], MD  Admit HPI / Brief Narrative: 81 year old male patient known chronic hypoxemic respiratory failure secondary to COPD on 3 L oxygen, non-small cell lung cancer who completed chemotherapy and radiation in May 2016, and a recent admission for spontaneous pneumothorax on 11/5 who presented to the ER with abrupt onset of left sided anterior pleuritic-type chest pain.  Echocardiogram noted only a small pericardial effusion.   After admission the patient was noted to have increased work of breathing and rapid response team was called. Patient complained of shortness of breath and also coughing up blood. CT thorax was done which showed 15% pneumothorax on the left.   Significant Events: 12/01- Admit to [facility] 12/02 - New Left Ptx - CT Chest: Small anterior left pneumothorax. Changes in right lung consistent with prior radiation pneumonitis. 12/03 CT Chest: Slight reduction in size of small left anterior pneumothorax. Mild consolidation RLL 12/03 - PCCM consulted & transfered to ICU 12/04 Port CXR: No obvious pneumothorax. RLL opacity persists. 12/05 Port CXR: No obvious pneumothorax. RLL opacity unchanged. 12/06 Transfer to SDU.   HPI/Subjective: Pt continues to c/o on unrelenting SOB, w/ no signif improvement over the last 48hrs.  He appears miserable and says he feels "not worth a shit."  He denies focal pain, n/v, or HA.    Assessment/Plan:  Small left pneumothorax Care per PCCM as follows:  "repeat cxr 09/13/15 and if stable opd fu. If recurs needs VATS pleurodesis" - no PTX was seen on f/u CXR 12/8 or 12/10  COPD exacerbation Slow to improve per pt - increased frequency of prn neb yesterday and returned steroid to BID dosing - add low dose anxiolytic to see if this helps as he is not severely hypoventilating and his oxygenation is actually quite  stable/adequate   New R LE warmth  No evidence of DVT on venous duplex - unilateral R LE edema persists   R leg weakness Pt is worried he may have suffered a CVA at the time of his admit - imaging would not change our tx options at this stage  - follow exam - cont medical care   Acute on chronic hypoxic respiratory failure baseline oxygen requirement 3 L/m at home - utilizing venturi today, w/ sats in the upper 90s    Coagulopathy w/ Hemoptysis Resolved - secondary to heparin & Xarelto - now off systemic anticoagulation given the patient's hemoptysis & acute pneumothorax  Possible right lower lobe healthcare associated pneumonia Completed a course of abx tx 12/9 - no sx to suggest worsening infection - follow  Acute delirium Resolved - likely secondary to hypercapnia in the setting of COPD exacerbation.  Atrial fibrillation holding systemic anticoagulation given hemoptysis - HR controlled   Acute renal failure Resolved - crt remains normal   Stage IIIa non-small cell lung cancer Status post treatment w/ chemo and XRT - initially diagnosed February 2016 - followed by Dr. Julien Nordmann - pt does not desire any further CA tx, but is not yet full comfort care only   Anemia Stable - no signs of active bleeding at this time   Thrombocytopenia Mild - plt count stable   History of hyperthyroidism TSH normal on 12/1  History of coronary artery disease Denies current cp  Nicotine dependence continuing nicotine patch   Code Status: NO CODE BLUE - DNR but still requires active care short of this  Family Communication:  no family present at time of exam today  Disposition Plan: SDU - eventual d/c to SNF for a rehab stay, w/ ultimate desire for eventual d/c home   Consultants: PCCM Cardiology - Wynonia Lawman  Antibiotics: levaquin 12/7 > 12/9  DVT prophylaxis: SCDs  Objective: Blood pressure 127/90, pulse 92, temperature 98.3 F (36.8 C), temperature source Oral, resp. rate 28, height  '5\' 10"'$  (1.778 m), weight 82.192 kg (181 lb 3.2 oz), SpO2 96 %. No intake or output data in the 24 hours ending 09/15/15 1618   Exam: General: sob at rest in bed - appears quite panicked  Lungs: poor air movement th/o - no active wheeze - course crackles scattered th/o all fields  Cardiovascular: Regular rate without murmur  Abdomen: Nontender, nondistended, soft, bowel sounds positive Extremities: No significant cyanosis, or clubbing;  1+ edema R LE - no edema L LE   Data Reviewed: Basic Metabolic Panel:  Recent Labs Lab 09/10/15 0205 09/11/15 0228 09/12/15 0249 09/13/15 0416 09/14/15 0256 09/15/15 0257  NA 138 141 140 140 139 141  K 3.4* 3.7 3.5 3.7 3.9 4.4  CL 98* 99* 98* 97* 98* 101  CO2 32 35* 33* 36* 35* 34*  GLUCOSE 157* 165* 146* 131* 110* 133*  BUN 35* 40* 41* 31* 31* 34*  CREATININE 1.22 1.26* 1.11 0.85 0.80 0.83  CALCIUM 8.6* 8.7* 8.8* 9.1 9.1 9.0  MG 2.0 2.1 2.0 2.4 2.7*  --   PHOS 3.7 3.1 2.6 2.7 2.7 3.6    CBC:  Recent Labs Lab 09/11/15 0228 09/12/15 0249 09/13/15 0416 09/14/15 0256 09/15/15 0257  WBC 11.2* 8.7 9.6 10.6* 11.3*  NEUTROABS 10.7* 8.3* 8.8* 9.6* 10.8*  HGB 9.7* 9.5* 9.9* 10.2* 10.2*  HCT 32.6* 30.8* 33.6* 33.8* 34.7*  MCV 88.1 87.7 88.2 88.5 89.0  PLT 125* 120* 132* 132* 146*    Liver Function Tests:  Recent Labs Lab 09/11/15 0228 09/12/15 0249 09/13/15 0416 09/14/15 0256 09/15/15 0257  ALBUMIN 2.4* 2.3* 2.6* 2.5* 2.4*    Coags:  Recent Labs Lab 09/11/15 0228 09/12/15 0249 09/13/15 0416 09/14/15 0256 09/15/15 0257  INR 1.11 1.09 1.08 1.08 1.10    Recent Labs Lab 09/11/15 0228 09/12/15 0249 09/13/15 0416 09/14/15 0256 09/15/15 0257  APTT 25 24 22* 23* 23*    Recent Results (from the past 240 hour(s))  MRSA PCR Screening     Status: None   Collection Time: 09/08/15  2:59 PM  Result Value Ref Range Status   MRSA by PCR NEGATIVE NEGATIVE Final    Comment:        The GeneXpert MRSA Assay (FDA approved for  NASAL specimens only), is one component of a comprehensive MRSA colonization surveillance program. It is not intended to diagnose MRSA infection nor to guide or monitor treatment for MRSA infections.   Culture, expectorated sputum-assessment     Status: None   Collection Time: 09/13/15 11:00 AM  Result Value Ref Range Status   Specimen Description SPUTUM  Final   Special Requests Normal  Final   Sputum evaluation   Final    THIS SPECIMEN IS ACCEPTABLE. RESPIRATORY CULTURE REPORT TO FOLLOW.   Report Status 09/13/2015 FINAL  Final  Culture, respiratory (NON-Expectorated)     Status: None (Preliminary result)   Collection Time: 09/13/15 11:00 AM  Result Value Ref Range Status   Specimen Description SPUTUM  Final   Special Requests NONE  Final   Gram Stain   Final    FEW WBC PRESENT, PREDOMINANTLY  PMN RARE SQUAMOUS EPITHELIAL CELLS PRESENT RARE YEAST RARE GRAM POSITIVE COCCI IN PAIRS    Culture   Final    FEW YEAST CONSISTENT WITH CANDIDA SPECIES Performed at [facility]    Report Status PENDING  Incomplete     Studies:   Recent x-ray studies have been reviewed in detail by the Attending Physician  Scheduled Meds:  Scheduled Meds: . budesonide-formoterol  2 puff Inhalation BID  . colchicine  0.6 mg Oral Daily  . dextromethorphan-guaiFENesin  2 tablet Oral BID  . digoxin  0.125 mg Oral Daily  . diltiazem  180 mg Oral Q12H  . feeding supplement (ENSURE ENLIVE)  237 mL Oral BID BM  . ipratropium-albuterol  3 mL Nebulization QID  . methylPREDNISolone (SOLU-MEDROL) injection  60 mg Intravenous Q12H  . pantoprazole  40 mg Oral QHS  . tiotropium  18 mcg Inhalation Daily  . triamcinolone cream   Topical TID    Time spent on care of this patient: 25 mins    Cherene Altes , MD   [facility] Office  [phone] Pager - Text Page per Shea Evans as per below:  On-Call/Text Page:      [email or web address]      password [email or web address]  If 7PM-7AM, please contact  night-coverage [email or web address] Password [email or web address] 09/15/2015, 4:18 PM   LOS: 9 days

## 2015-09-16 LAB — CULTURE, RESPIRATORY

## 2015-09-16 LAB — CULTURE, RESPIRATORY W GRAM STAIN

## 2015-09-16 MED ORDER — CLONAZEPAM 0.5 MG PO TABS
0.5000 mg | ORAL_TABLET | Freq: Three times a day (TID) | ORAL | Status: DC
  Administered 2015-09-16 – 2015-09-17 (×3): 0.5 mg via ORAL
  Filled 2015-09-16 (×3): qty 1

## 2015-09-16 MED ORDER — METHYLPREDNISOLONE SODIUM SUCC 125 MG IJ SOLR
60.0000 mg | INTRAMUSCULAR | Status: DC
  Administered 2015-09-17 – 2015-09-19 (×3): 60 mg via INTRAVENOUS
  Filled 2015-09-16 (×3): qty 2

## 2015-09-16 MED ORDER — LORAZEPAM 2 MG/ML IJ SOLN: 0.5000 mg | INTRAMUSCULAR | Status: DC | PRN

## 2015-09-16 NOTE — Progress Notes (Signed)
Pt placed on NRB for saturation problems.  Venturi at 11LPM and 45% was insufficient, and pt needed more flow.  Rt will monitor and titrate as tolerated.

## 2015-09-16 NOTE — Progress Notes (Signed)
Patient's daughter, Pincus Badder ([phone]) called and was upset stating that they haven't seen and spoke to any MD for 2 days.  MD was paged.  Primary Nurse is made aware of this complaint.

## 2015-09-16 NOTE — Progress Notes (Signed)
[place] TEAM 1 - Stepdown/ICU TEAM PROGRESS NOTE  BERTRAM HADDIX QMG:[id number] DOB: 10-Oct-1932 DOA: 09/13/2015 PCP: [name], MD  Admit HPI / Brief Narrative: 81 year old male patient known chronic hypoxemic respiratory failure secondary to COPD on 3 L oxygen, non-small cell lung cancer who completed chemotherapy and radiation in May 2016, and a recent admission for spontaneous pneumothorax on 11/5 who presented to the ER with abrupt onset of left sided anterior pleuritic-type chest pain.  Echocardiogram noted only a small pericardial effusion.   After admission the patient was noted to have increased work of breathing and rapid response team was called. Patient complained of shortness of breath and also coughing up blood. CT thorax was done which showed 15% pneumothorax on the left.   Significant Events: 12/01 Admit to [facility] 12/02 New Left Ptx - CT Chest: Small anterior left pneumothorax. Changes in right lung consistent with prior radiation pneumonitis. 12/03 CT Chest: Slight reduction in size of small left anterior pneumothorax. Mild consolidation RLL 12/03 PCCM consulted & transfered to ICU 12/04 Port CXR: No obvious pneumothorax. RLL opacity persists. 12/05 Port CXR: No obvious pneumothorax. RLL opacity unchanged. 12/06 Transfer to SDU  HPI/Subjective: The patient tells me he does not feel any better today.  He has panicky and obviously very uncomfortable.  He is tachypnea.  He denies chest pain abdominal pain fevers or chills.  Assessment/Plan:  Small left pneumothorax Care per PCCM as follows:  "repeat cxr 09/13/15 and if stable opd fu. If recurs needs VATS pleurodesis" - no PTX was seen on f/u CXR 12/8 or 12/10  COPD exacerbation The patient is on maximum dose medical therapy and has not responded - at this time it appears that anxiety is interfering with his respiratory mechanics - he agrees that he feels very anxious and is willing to try upward titration of  anxiolytics - I have explained to him that we have to do this very carefully and slowly to avoid respiratory depression  Acute on chronic hypoxic respiratory failure baseline oxygen requirement 3 L/m at home - utilizing venturi today, w/ sats in the upper 90s   R leg weakness Pt is worried he may have suffered a CVA at the time of his admit - imaging would not change our tx options at this stage  - follow exam - cont medical care   Coagulopathy w/ Hemoptysis Resolved - secondary to heparin & Xarelto - now off systemic anticoagulation given the patient's hemoptysis & acute pneumothorax  Possible right lower lobe healthcare associated pneumonia Completed a course of abx tx 12/9 - no sx to suggest worsening infection   Acute delirium Resolved - likely secondary to hypercapnia in the setting of COPD exacerbation.  Atrial fibrillation holding systemic anticoagulation given hemoptysis - rate controlled   Acute renal failure Resolved - crt remains normal   Stage IIIa non-small cell lung cancer Status post treatment w/ chemo and XRT - initially diagnosed February 2016 - followed by Dr. Julien Nordmann - pt does not desire any further CA tx, but is not yet comfort care only   Anemia Stable - no signs of active bleeding at this time   Thrombocytopenia Mild - plt count stable   History of hyperthyroidism TSH normal on 12/1  History of coronary artery disease Denies current cp  Nicotine dependence continuing nicotine patch   Code Status: NO CODE BLUE - DNR but still requires active care short of this  Family Communication: spoke w/ niece at bedside, and daughter via phone  at length  Disposition Plan: SDU - eventual d/c to SNF for a rehab stay, w/ ultimate desire for eventual d/c home but not ready for d/c due to resp distress    Consultants: PCCM Cardiology - Wynonia Lawman  Antibiotics: levaquin 12/7 > 12/9  DVT prophylaxis: SCDs  Objective: Blood pressure 113/79, pulse 92, temperature  97.3 F (36.3 C), temperature source Oral, resp. rate 22, height '5\' 10"'$  (1.778 m), weight 82.192 kg (181 lb 3.2 oz), SpO2 98 %.  Intake/Output Summary (Last 24 hours) at 09/16/15 1414 Last data filed at 09/16/15 0900  Gross per 24 hour  Intake    480 ml  Output      0 ml  Net    480 ml     Exam: General: sob at rest in bed - appears panicked - tachypneic to 40bpm Lungs: poor air movement th/o - no wheeze - course crackles scattered th/o all fields  Cardiovascular: Regular rate without murmur  Abdomen: Nontender, nondistended, soft, bowel sounds hypoactive  Extremities: No significant cyanosis, or clubbing;  1+ edema R LE / no edema L LE   Data Reviewed: Basic Metabolic Panel:  Recent Labs Lab 09/10/15 0205 09/11/15 0228 09/12/15 0249 09/13/15 0416 09/14/15 0256 09/15/15 0257  NA 138 141 140 140 139 141  K 3.4* 3.7 3.5 3.7 3.9 4.4  CL 98* 99* 98* 97* 98* 101  CO2 32 35* 33* 36* 35* 34*  GLUCOSE 157* 165* 146* 131* 110* 133*  BUN 35* 40* 41* 31* 31* 34*  CREATININE 1.22 1.26* 1.11 0.85 0.80 0.83  CALCIUM 8.6* 8.7* 8.8* 9.1 9.1 9.0  MG 2.0 2.1 2.0 2.4 2.7*  --   PHOS 3.7 3.1 2.6 2.7 2.7 3.6    CBC:  Recent Labs Lab 09/11/15 0228 09/12/15 0249 09/13/15 0416 09/14/15 0256 09/15/15 0257  WBC 11.2* 8.7 9.6 10.6* 11.3*  NEUTROABS 10.7* 8.3* 8.8* 9.6* 10.8*  HGB 9.7* 9.5* 9.9* 10.2* 10.2*  HCT 32.6* 30.8* 33.6* 33.8* 34.7*  MCV 88.1 87.7 88.2 88.5 89.0  PLT 125* 120* 132* 132* 146*    Liver Function Tests:  Recent Labs Lab 09/11/15 0228 09/12/15 0249 09/13/15 0416 09/14/15 0256 09/15/15 0257  ALBUMIN 2.4* 2.3* 2.6* 2.5* 2.4*    Coags:  Recent Labs Lab 09/11/15 0228 09/12/15 0249 09/13/15 0416 09/14/15 0256 09/15/15 0257  INR 1.11 1.09 1.08 1.08 1.10    Recent Labs Lab 09/11/15 0228 09/12/15 0249 09/13/15 0416 09/14/15 0256 09/15/15 0257  APTT 25 24 22* 23* 23*    Recent Results (from the past 240 hour(s))  MRSA PCR Screening      Status: None   Collection Time: 09/08/15  2:59 PM  Result Value Ref Range Status   MRSA by PCR NEGATIVE NEGATIVE Final    Comment:        The GeneXpert MRSA Assay (FDA approved for NASAL specimens only), is one component of a comprehensive MRSA colonization surveillance program. It is not intended to diagnose MRSA infection nor to guide or monitor treatment for MRSA infections.   Culture, expectorated sputum-assessment     Status: None   Collection Time: 09/13/15 11:00 AM  Result Value Ref Range Status   Specimen Description SPUTUM  Final   Special Requests Normal  Final   Sputum evaluation   Final    THIS SPECIMEN IS ACCEPTABLE. RESPIRATORY CULTURE REPORT TO FOLLOW.   Report Status 09/13/2015 FINAL  Final  Culture, respiratory (NON-Expectorated)     Status: None   Collection Time:  09/13/15 11:00 AM  Result Value Ref Range Status   Specimen Description SPUTUM  Final   Special Requests NONE  Final   Gram Stain   Final    FEW WBC PRESENT, PREDOMINANTLY PMN RARE SQUAMOUS EPITHELIAL CELLS PRESENT RARE YEAST RARE GRAM POSITIVE COCCI IN PAIRS    Culture   Final    FEW YEAST CONSISTENT WITH CANDIDA SPECIES Performed at [facility]    Report Status 09/16/2015 FINAL  Final     Studies:   Recent x-ray studies have been reviewed in detail by the Attending Physician  Scheduled Meds:  Scheduled Meds: . budesonide-formoterol  2 puff Inhalation BID  . clonazePAM  0.25 mg Oral TID  . colchicine  0.6 mg Oral Daily  . dextromethorphan-guaiFENesin  2 tablet Oral BID  . digoxin  0.125 mg Oral Daily  . diltiazem  180 mg Oral Q12H  . feeding supplement (ENSURE ENLIVE)  237 mL Oral BID BM  . ipratropium-albuterol  3 mL Nebulization QID  . methylPREDNISolone (SOLU-MEDROL) injection  60 mg Intravenous Q12H  . pantoprazole  40 mg Oral QHS  . tiotropium  18 mcg Inhalation Daily  . triamcinolone cream   Topical TID    Time spent on care of this patient: 35 mins     MCCLUNG,JEFFREY T , MD   [facility] Office  [phone] Pager - Text Page per Shea Evans as per below:  On-Call/Text Page:      [email or web address]      password [email or web address]  If 7PM-7AM, please contact night-coverage [email or web address] Password [email or web address] 09/16/2015, 2:14 PM   LOS: 10 days

## 2015-09-17 MED ORDER — BOOST PLUS PO LIQD
237.0000 mL | Freq: Three times a day (TID) | ORAL | Status: DC
  Filled 2015-09-17 (×11): qty 237

## 2015-09-17 MED ORDER — CLONAZEPAM 1 MG PO TABS
1.0000 mg | ORAL_TABLET | Freq: Three times a day (TID) | ORAL | Status: DC
  Administered 2015-09-17 – 2015-09-18 (×2): 1 mg via ORAL
  Filled 2015-09-17 (×4): qty 1

## 2015-09-17 MED ORDER — LORAZEPAM 2 MG/ML IJ SOLN
0.5000 mg | INTRAMUSCULAR | Status: DC | PRN
  Administered 2015-09-18: 1 mg via INTRAVENOUS
  Administered 2015-09-18: 0.5 mg via INTRAVENOUS
  Administered 2015-09-19 (×2): 1 mg via INTRAVENOUS
  Filled 2015-09-17 (×4): qty 1

## 2015-09-17 MED ORDER — GLYCOPYRROLATE 0.2 MG/ML IJ SOLN
0.2000 mg | INTRAMUSCULAR | Status: AC
  Administered 2015-09-17 – 2015-09-18 (×6): 0.2 mg via INTRAVENOUS
  Filled 2015-09-17 (×8): qty 1

## 2015-09-17 NOTE — Progress Notes (Signed)
Patient not able to tolerate chest vest therapy. RT instructed patient and patients family on how to use the flutter device instead and the importance of the device. RT will continue to monitor.

## 2015-09-17 NOTE — Progress Notes (Signed)
[facility] TEAM 1 - Stepdown/ICU TEAM PROGRESS NOTE  [name] QHU:[id number] DOB: Jun 27, 1933 DOA: 09/23/2015 PCP: [name], MD  Admit HPI / Brief Narrative: 81 year old male patient known chronic hypoxemic respiratory failure secondary to COPD on 3 L oxygen, non-small cell lung cancer who completed chemotherapy and radiation in May 2016, and a recent admission for spontaneous pneumothorax on 11/5 who presented to the ER with abrupt onset of left sided anterior pleuritic-type chest pain.  Echocardiogram noted only a small pericardial effusion.   After admission the patient was noted to have increased work of breathing and rapid response team was called. Patient complained of shortness of breath and also coughing up blood. CT thorax was done which showed 15% pneumothorax on the left.   Significant Events: 12/01 Admit to [facility] 12/02 New Left Ptx - CT Chest: Small anterior left pneumothorax. Changes in right lung consistent with prior radiation pneumonitis. 12/03 CT Chest: Slight reduction in size of small left anterior pneumothorax. Mild consolidation RLL 12/03 PCCM consulted & transfered to ICU 12/04 Port CXR: No obvious pneumothorax. RLL opacity persists. 12/05 Port CXR: No obvious pneumothorax. RLL opacity unchanged. 12/06 Transfer to SDU  HPI/Subjective: The patient still appears to be quite uncomfortable and quite anxious this morning.  He is sitting up in a chair which does appear to improve his respiratory mechanics somewhat.  He denies new complaints but continues to tell me that he "feels like shit."  Palliative care has met with the family today briefly, and plans to have a full family meeting with the next 24-48 hours.  At the present time it appears the plan is changing with the new plan being for the patient to be discharged home with hospice.  Assessment/Plan:  Small left pneumothorax Care per PCCM as follows:  "repeat cxr 09/13/15 and if stable opd fu. If  recurs needs VATS pleurodesis" - no PTX was seen on f/u CXR 12/8 or 12/10 - this issue appears to have resolved   COPD exacerbation The patient is on maximum dose medical therapy and has not responded - at this time it appears that anxiety is still interfering with his respiratory mechanics - increase anxiolytic again today and follow results   Acute on chronic hypoxic respiratory failure baseline oxygen requirement 3 L/m at home - utilizing venturi today, w/ sats in the low 90s   R leg weakness Pt is worried he may have suffered a CVA at the time of his admit - imaging would not change our tx options at this stage  - no further investigation planned at this time   Coagulopathy w/ Hemoptysis Resolved - secondary to heparin & Xarelto - now off systemic anticoagulation given the patient's hemoptysis & acute pneumothorax  Possible right lower lobe healthcare associated pneumonia Completed a course of abx tx 12/9 - no sx to suggest worsening infection   Acute delirium Resolved - likely secondary to hypercapnia in the setting of COPD exacerbation.  Atrial fibrillation holding systemic anticoagulation given hemoptysis - rate remains controlled   Acute renal failure Resolved - crt remains normal   Stage IIIa non-small cell lung cancer Status post treatment w/ chemo and XRT - initially diagnosed February 2016 - followed by Dr. Julien Nordmann - pt does not desire any further CA tx  Anemia Stable - no signs of active bleeding at this time   Thrombocytopenia Mild - plt count stable   History of hyperthyroidism TSH normal on 12/1  History of coronary artery disease Denies current cp  Nicotine dependence continuing nicotine patch   Code Status: NO CODE BLUE - DNR but still requires active care short of this  Family Communication: spoke w/ one of his daughters at the bedside  Disposition Plan: SDU - probable d/c home w/ hospice now, pending formal family meeting w/ Palliative Care    Consultants: PCCM Cardiology - [place]  Antibiotics: levaquin 12/7 > 12/9  DVT prophylaxis: SCDs  Objective: Blood pressure 133/85, pulse 92, temperature 97 F (36.1 C), temperature source Axillary, resp. rate 35, height _0  (1.778 m), weight 82.192 kg (181 lb 3.2 oz), SpO2 91 %.  Intake/Output Summary (Last 24 hours) at 09/17/15 1623 Last data filed at 09/17/15 0901  Gross per 24 hour  Intake    600 ml  Output      0 ml  Net    600 ml     Exam: General: sob sitting up in bedside chair Lungs: poor air movement th/o - course crackles scattered th/o all fields  Cardiovascular: Regular rate   Abdomen: Nontender, nondistended, soft Extremities: No significant cyanosis, or clubbing;  1+ edema R LE / no edema L LE   Data Reviewed: Basic Metabolic Panel:  Recent Labs Lab 09/11/15 0228 09/12/15 0249 09/13/15 0416 09/14/15 0256 09/15/15 0257  NA 141 140 140 139 141  K 3.7 3.5 3.7 3.9 4.4  CL 99* 98* 97* 98* 101  CO2 35* 33* 36* 35* 34*  GLUCOSE 165* 146* 131* 110* 133*  BUN 40* 41* 31* 31* 34*  CREATININE 1.26* 1.11 0.85 0.80 0.83  CALCIUM 8.7* 8.8* 9.1 9.1 9.0  MG 2.1 2.0 2.4 2.7*  --   PHOS 3.1 2.6 2.7 2.7 3.6    CBC:  Recent Labs Lab 09/11/15 0228 09/12/15 0249 09/13/15 0416 09/14/15 0256 09/15/15 0257  WBC 11.2* 8.7 9.6 10.6* 11.3*  NEUTROABS 10.7* 8.3* 8.8* 9.6* 10.8*  HGB 9.7* 9.5* 9.9* 10.2* 10.2*  HCT 32.6* 30.8* 33.6* 33.8* 34.7*  MCV 88.1 87.7 88.2 88.5 89.0  PLT 125* 120* 132* 132* 146*    Liver Function Tests:  Recent Labs Lab 09/11/15 0228 09/12/15 0249 09/13/15 0416 09/14/15 0256 09/15/15 0257  ALBUMIN 2.4* 2.3* 2.6* 2.5* 2.4*    Coags:  Recent Labs Lab 09/11/15 0228 09/12/15 0249 09/13/15 0416 09/14/15 0256 09/15/15 0257  INR 1.11 1.09 1.08 1.08 1.10    Recent Labs Lab 09/11/15 0228 09/12/15 0249 09/13/15 0416 09/14/15 0256 09/15/15 0257  APTT 25 24 22* 23* 23*    Recent Results (from the past 240  hour(s))  MRSA PCR Screening     Status: None   Collection Time: 09/08/15  2:59 PM  Result Value Ref Range Status   MRSA by PCR NEGATIVE NEGATIVE Final    Comment:        The GeneXpert MRSA Assay (FDA approved for NASAL specimens only), is one component of a comprehensive MRSA colonization surveillance program. It is not intended to diagnose MRSA infection nor to guide or monitor treatment for MRSA infections.   Culture, expectorated sputum-assessment     Status: None   Collection Time: 09/13/15 11:00 AM  Result Value Ref Range Status   Specimen Description SPUTUM  Final   Special Requests Normal  Final   Sputum evaluation   Final    THIS SPECIMEN IS ACCEPTABLE. RESPIRATORY CULTURE REPORT TO FOLLOW.   Report Status 09/13/2015 FINAL  Final  Culture, respiratory (NON-Expectorated)     Status: None   Collection Time: 09/13/15 11:00 AM  Result Value  Ref Range Status   Specimen Description SPUTUM  Final   Special Requests NONE  Final   Gram Stain   Final    FEW WBC PRESENT, PREDOMINANTLY PMN RARE SQUAMOUS EPITHELIAL CELLS PRESENT RARE YEAST RARE GRAM POSITIVE COCCI IN PAIRS    Culture   Final    FEW YEAST CONSISTENT WITH CANDIDA SPECIES Performed at [facility]    Report Status 09/16/2015 FINAL  Final     Studies:   Recent x-ray studies have been reviewed in detail by the Attending Physician  Scheduled Meds:  Scheduled Meds: . budesonide-formoterol  2 puff Inhalation BID  . clonazePAM  1 mg Oral TID  . dextromethorphan-guaiFENesin  2 tablet Oral BID  . digoxin  0.125 mg Oral Daily  . diltiazem  180 mg Oral Q12H  . glycopyrrolate  0.2 mg Intravenous 6 times per day  . ipratropium-albuterol  3 mL Nebulization QID  . lactose free nutrition  237 mL Oral TID BM  . methylPREDNISolone (SOLU-MEDROL) injection  60 mg Intravenous Q24H  . pantoprazole  40 mg Oral QHS  . tiotropium  18 mcg Inhalation Daily  . triamcinolone cream   Topical TID    Time spent on  care of this patient: 25 mins    Cherene Altes , MD   [facility] Office  [phone] Pager - Text Page per Shea Evans as per below:  On-Call/Text Page:      [email or web address]      password [email or web address]  If 7PM-7AM, please contact night-coverage [email or web address] Password [email or web address] 09/17/2015, 4:23 PM   LOS: 11 days

## 2015-09-17 NOTE — Progress Notes (Addendum)
                                                                                                                                                                                                          Daily Progress Note   Patient Name: [name]       Date: 09/17/2015 DOB: 10/02/1933  Age: 81 y.o. MRN#: [id number] Attending Physician: Jeffrey T McClung, MD Primary Care Physician: BURNETT,BRENT A, MD Admit Date: 09/23/2015  Reason for Consultation/Follow-up: Establishing goals of care  Subjective: I met again with [name] and daughter, [name], at bedside. Also received call from daughter, [name], earlier but I have not spoken with her yet. Unfortunately [name] is now on venti mask and oxygen needs have increased over the weekend. We discussed that we have been disappointed by the lack of progress but now we are seeing actual decline again. Mr. Throgmorton acknowledges this is not working. I explained options - or lack of - again. We discussed that it is not beneficial to him to continue here in the hospital if we are no longer able to provide interventions to help him improve. He agrees and says he wants to go home. Talked about the help that he will need to get him home. I mentioned hospice and he agrees that this can be helpful to him. Discussed some with [name] and we agree to meet with the rest of family to talk about if we can realistically get him home and keep him happy and safe there.   - Will trial 24 hours scheduled robinul for secretions/congestion. Trial chest PT. Will remeet with family today/tomorrow to plan potential home with hospice. RN has ordered new bed for his comfort.     Length of Stay: 11 days  Current Medications: Scheduled Meds:  . budesonide-formoterol  2 puff Inhalation BID  . clonazePAM  0.5 mg Oral TID  . dextromethorphan-guaiFENesin  2 tablet Oral BID  . digoxin  0.125 mg Oral Daily  . diltiazem  180 mg Oral Q12H  . glycopyrrolate  0.2 mg  Intravenous 6 times per day  . ipratropium-albuterol  3 mL Nebulization QID  . lactose free nutrition  237 mL Oral TID BM  . methylPREDNISolone (SOLU-MEDROL) injection  60 mg Intravenous Q24H  . pantoprazole  40 mg Oral QHS  . tiotropium  18 mcg Inhalation Daily  . triamcinolone cream   Topical TID    Continuous Infusions:    PRN Meds: acetaminophen, albuterol, LORazepam, ondansetron (ZOFRAN) IV, polyethylene glycol, traMADol, zolpidem  Physical Exam: Physical   Exam  Constitutional: He is oriented to person, place, and time. He appears well-developed.  HENT:  Head: Normocephalic.  Cardiovascular: Normal rate.   Pulmonary/Chest: Effort normal.  Cough  Neurological: He is alert and oriented to person, place, and time.                Vital Signs: BP 121/75 mmHg  Pulse 87  Temp(Src) 97.7 F (36.5 C) (Axillary)  Resp 16  Ht 5' 10" (1.778 m)  Wt 82.192 kg (181 lb 3.2 oz)  BMI 26.00 kg/m2  SpO2 98% SpO2: SpO2: 98 % O2 Device: O2 Device: Venturi Mask O2 Flow Rate: O2 Flow Rate (L/min): 14 L/min  Intake/output summary:   Intake/Output Summary (Last 24 hours) at 09/17/15 1104 Last data filed at 09/17/15 0901  Gross per 24 hour  Intake    600 ml  Output      0 ml  Net    600 ml   LBM: Last BM Date: 09/14/15 Baseline Weight: Weight: 83.915 kg (185 lb) (pt reported) Most recent weight: Weight: 82.192 kg (181 lb 3.2 oz)       Palliative Assessment/Data: Flowsheet Rows        Most Recent Value   Intake Tab    Referral Department  Critical care   Unit at Time of Referral  ICU   Palliative Care Primary Diagnosis  Cancer   Date Notified  09/10/15   Palliative Care Type  New Palliative care   Reason for referral  Clarify Goals of Care   Date of Admission  09/26/2015   # of days IP prior to Palliative referral  4   Clinical Assessment    Psychosocial & Spiritual Assessment    Palliative Care Outcomes       Additional Data Reviewed: CBC    Component Value Date/Time     WBC 11.3* 09/15/2015 0257   WBC 10.3 06/25/2015 0941   RBC 3.90* 09/15/2015 0257   RBC 4.54 06/25/2015 0941   HGB 10.2* 09/15/2015 0257   HGB 11.3* 06/25/2015 0941   HCT 34.7* 09/15/2015 0257   HCT 36.1* 06/25/2015 0941   PLT 146* 09/15/2015 0257   PLT 181 06/25/2015 0941   MCV 89.0 09/15/2015 0257   MCV 79.7 06/25/2015 0941   MCH 26.2 09/15/2015 0257   MCH 24.9* 06/25/2015 0941   MCHC 29.4* 09/15/2015 0257   MCHC 31.3* 06/25/2015 0941   RDW 17.6* 09/15/2015 0257   RDW 18.7* 06/25/2015 0941   LYMPHSABS 0.4* 09/15/2015 0257   LYMPHSABS 0.5* 06/25/2015 0941   MONOABS 0.1 09/15/2015 0257   MONOABS 0.6 06/25/2015 0941   EOSABS 0.0 09/15/2015 0257   EOSABS 0.1 06/25/2015 0941   BASOSABS 0.0 09/15/2015 0257   BASOSABS 0.0 06/25/2015 0941    CMP     Component Value Date/Time   NA 141 09/15/2015 0257   NA 139 06/25/2015 0941   K 4.4 09/15/2015 0257   K 4.0 06/25/2015 0941   CL 101 09/15/2015 0257   CO2 34* 09/15/2015 0257   CO2 29 06/25/2015 0941   GLUCOSE 133* 09/15/2015 0257   GLUCOSE 96 06/25/2015 0941   BUN 34* 09/15/2015 0257   BUN 28.7* 06/25/2015 0941   CREATININE 0.83 09/15/2015 0257   CREATININE 1.2 06/25/2015 0941   CALCIUM 9.0 09/15/2015 0257   CALCIUM 9.4 06/25/2015 0941   PROT 6.4 06/25/2015 0941   PROT 6.5 05/15/2015 1700   ALBUMIN 2.4* 09/15/2015 0257   ALBUMIN 3.1* 06/25/2015 0941   AST  12 06/25/2015 0941   AST 22 05/15/2015 1700   ALT 17 06/25/2015 0941   ALT 10* 05/15/2015 1700   ALKPHOS 71 06/25/2015 0941   ALKPHOS 92 05/15/2015 1700   BILITOT 0.32 06/25/2015 0941   BILITOT 0.5 05/15/2015 1700   GFRNONAA >60 09/15/2015 0257   GFRAA >60 09/15/2015 0257       Problem List:  Patient Active Problem List   Diagnosis Date Noted  . Acute on chronic respiratory failure with hypoxia ([place])   . Metabolic alkalosis   . Pneumothorax, left   . Hemoptysis   . HCAP (healthcare-associated pneumonia)   . Tobacco abuse   . Palliative care encounter    . Non-small cell lung cancer (NSCLC) ([place])   . Pneumothorax on left   . COPD exacerbation ([place])   . Acute delirium   . Pleuritic chest pain 09/09/2015  . Chronic respiratory failure with hypoxia ([place]) 09/18/2015  . COPD (chronic obstructive pulmonary disease) ([place])   . History of pneumothorax   . Radiation pneumonitis ([place])   . Pleural effusion 05/31/2015  . CAD (coronary artery disease), native coronary artery 12/03/2014  . Thoracic ascending aortic aneurysm ([place]) 12/03/2014  . Current use of long term anticoagulation   . SCC (squamous cell carcinoma of lung) ([place])   . History of tobacco abuse 11/25/2014  . Chronic atrial fibrillation [facility])      Palliative Care Assessment & Plan    1.Code Status:  DNR    Code Status Orders        Start     Ordered   09/30/2015 1242  Do not attempt resuscitation (DNR)   Continuous    Question Answer Comment  In the event of cardiac or respiratory ARREST Do not call a "code blue"   In the event of cardiac or respiratory ARREST Do not perform Intubation, CPR, defibrillation or ACLS   In the event of cardiac or respiratory ARREST Use medication by any route, position, wound care, and other measures to relive pain and suffering. May use oxygen, suction and manual treatment of airway obstruction as needed for comfort.      09/09/2015 1241       2. Goals of Care/Additional Recommendations:  Treat the treatable. Appears that aggressive treatment is not his wish. No further chemotherapy.  Limitations on Scope of Treatment: No Chemotherapy or radiation  Desire for further Chaplaincy support:no  Psycho-social Needs: Caregiving  Support/Resources  3. Symptom Management:  Constipation: Relieved. Miralax ordered prn.   SOB: Per pulmonology. Continue oxygen.  Congestion: Trial of robinul 0.2 mg IV every 4 hours x 24 hours.   Weakness: PT consult.  Bilat swelling in feet: Wishes to minimize steroids. Encouraged exercises in bed. TED  hose.   4. Palliative Prophylaxis:   Bowel Regimen and Delirium Protocol  5. Prognosis: Poor. Likely weeks to months. < 6 months.   6. Discharge Planning:  Home with hospice if he can be supported at home.    Thank you for allowing the Palliative Medicine Team to assist in the care of this patient.   Time In: 8657 Time Out: 1120 Total Time 80mn Prolonged Time Billed  no         APershing Proud NP  184/69/6295 11:04 AM  Please contact Palliative Medicine Team phone at [phone] questions and concerns.

## 2015-09-17 NOTE — Progress Notes (Signed)
Physical Therapy Treatment Patient Details Name: [name] MRN: [id number] DOB: 03-31-1933 Today's Date: 09/17/2015    History of Present Illness This is an 81 year old male patient known chronic hypoxemic respiratory failure secondary to COPD on 3 L oxygen, no non-small cell lung cancer who completed chemotherapy and radiation in May 2016, Recent admission for spontaneous pneumothorax on 11/5, atrial fibrillation on [place], known thoracic ascending aortic aneurysm measuring 4.3 cm as of febrile 2016 CT, abnormal TSH and August 2016, history of radiation pneumonitis, known CAD and GERD.  Patient admitted with abrupt onset of chest pain.    PT Comments    Pt admitted with above diagnosis. Pt currently with functional limitations due to balance and endurance deficits. Pt able to transfer to chair but nothing more than that and a few exercises due to weakness and on 55% face mask.  Will continue as pt able.   Pt will benefit from skilled PT to increase their independence and safety with mobility to allow discharge to the venue listed below.    Follow Up Recommendations  SNF;Supervision/Assistance - 24 hour     Equipment Recommendations  Other (comment) (TBA)    Recommendations for Other Services       Precautions / Restrictions Precautions Precautions: Fall Restrictions Weight Bearing Restrictions: No    Mobility  Bed Mobility Overal bed mobility: Needs Assistance Bed Mobility: Supine to Sit     Supine to sit: Mod assist;+2 for physical assistance     General bed mobility comments: assist to lift trunk upright, assist for legs out of bed  Transfers Overall transfer level: Needs assistance Equipment used: None Transfers: Stand Pivot Transfers;Sit to/from Stand Sit to Stand: Mod assist;+2 physical assistance Stand pivot transfers: Mod assist;+2 physical assistance       General transfer comment: Knees blocked to prevent buckling with transfer. Pivot transfer toward  right. Pt reporting weakness R>L.  Ambulation/Gait             General Gait Details: unable due to weakness and fatigue   Stairs            Wheelchair Mobility    Modified Rankin (Stroke Patients Only)       Balance Overall balance assessment: Needs assistance Sitting-balance support: Feet supported;Bilateral upper extremity supported Sitting balance-Leahy Scale: Poor Sitting balance - Comments: needed UE support to sit EOB.  Postural control: Posterior lean Standing balance support: Bilateral upper extremity supported;During functional activity Standing balance-Leahy Scale: Poor Standing balance comment: Could not stand fully upright for transfer.                     Cognition Arousal/Alertness: Awake/alert Behavior During Therapy: WFL for tasks assessed/performed Overall Cognitive Status: Within Functional Limits for tasks assessed                      Exercises General Exercises - Lower Extremity Ankle Circles/Pumps: AROM;Both;10 reps;Supine Heel Slides: AROM;Right;Left;10 reps;Supine    General Comments        Pertinent Vitals/Pain Pain Assessment: Faces Faces Pain Scale: Hurts even more Pain Location: generalized pain  Pain Descriptors / Indicators: Grimacing;Guarding;Sore Pain Intervention(s): Limited activity within patient's tolerance;Monitored during session;Repositioned  VSS on 55% face mask.      Home Living                      Prior Function            PT Goals (current goals  can now be found in the care plan section) Progress towards PT goals: Not progressing toward goals - comment (Respiratory status decr)    Frequency  Min 3X/week    PT Plan Discharge plan needs to be updated    Co-evaluation             End of Session Equipment Utilized During Treatment: Gait belt;Oxygen Activity Tolerance: Patient limited by fatigue Patient left: with call bell/phone within reach;with family/visitor  present;in chair;with chair alarm set     Time: 1018-1030 PT Time Calculation (min) (ACUTE ONLY): 12 min  Charges:  $Therapeutic Activity: 8-22 mins                    G CodesIrwin Brakeman F 25-Sep-2015, 3:30 PM [employer] [facility] [phone] (pager)

## 2015-09-18 ENCOUNTER — Other Ambulatory Visit: Payer: Medicare Other

## 2015-09-18 DIAGNOSIS — I2510 Atherosclerotic heart disease of native coronary artery without angina pectoris: Secondary | ICD-10-CM | POA: Insufficient documentation

## 2015-09-18 DIAGNOSIS — J939 Pneumothorax, unspecified: Secondary | ICD-10-CM | POA: Insufficient documentation

## 2015-09-18 DIAGNOSIS — Z8709 Personal history of other diseases of the respiratory system: Secondary | ICD-10-CM

## 2015-09-18 MED ORDER — MORPHINE SULFATE (CONCENTRATE) 10 MG/0.5ML PO SOLN
5.0000 mg | ORAL | Status: DC | PRN
  Administered 2015-09-19 (×2): 5 mg via ORAL
  Filled 2015-09-18 (×2): qty 0.5

## 2015-09-18 MED ORDER — GLYCOPYRROLATE 0.2 MG/ML IJ SOLN
0.2000 mg | INTRAMUSCULAR | Status: DC
  Administered 2015-09-18 – 2015-09-19 (×4): 0.2 mg via INTRAVENOUS
  Filled 2015-09-18 (×12): qty 1

## 2015-09-18 NOTE — Care Management Important Message (Signed)
Important Message  Patient Details  Name: [name] MRN: [id number] Date of Birth: [birth year]   Medicare Important Message Given:  Yes    Charan Prieto Abena 09/18/2015, 10:04 AM

## 2015-09-18 NOTE — Progress Notes (Signed)
Daily Progress Note   Patient Name: [name]       Date: 09/18/2015 DOB: 23-Apr-1933  Age: 81 y.o. MRN#: [id number] Attending Physician: Allie Bossier, MD Primary Care Physician: [name], MD Admit Date: 10/06/2015  Reason for Consultation/Follow-up: Establishing goals of care  Subjective: I met today with [name] and his family today. They are very emotional and anxious about the thought of taking him home. However, they do know that this is exactly what [name] wants. He talked about strategies and logistics to make home most successful for him. We also talked about how this may very well be a limited time for him and how hospice facility is always a good option to keep in mind if home is not working out. He does just want to be at home along with his dog. Goal will be for more comfort focused care and they agree to use the help of hospice. Spoke about use of oxygen, nebs, and morphine to manage comfortable breathing at home. They are planning where and how to utilize hospital bed, lift equipment, and seating at home to make him most comfortable. [name] got tearful today thinking about leaving his family but he does want to go home. We all agree that he will likely fare better at home as the hospital is becoming frustrating and not helping him. Family wants to provide him with good quality time. Emotional support provided.    Length of Stay: 12 days  Current Medications: Scheduled Meds:  . budesonide-formoterol  2 puff Inhalation BID  . clonazePAM  1 mg Oral TID  . dextromethorphan-guaiFENesin  2 tablet Oral BID  . digoxin  0.125 mg Oral Daily  . diltiazem  180 mg Oral Q12H  . glycopyrrolate  0.2 mg Intravenous 6 times per day  . ipratropium-albuterol  3 mL  Nebulization QID  . lactose free nutrition  237 mL Oral TID BM  . methylPREDNISolone (SOLU-MEDROL) injection  60 mg Intravenous Q24H  . pantoprazole  40 mg Oral QHS  . tiotropium  18 mcg Inhalation Daily  . triamcinolone cream   Topical TID    Continuous Infusions:    PRN Meds: acetaminophen, albuterol, LORazepam, morphine CONCENTRATE, ondansetron (ZOFRAN) IV, polyethylene glycol, traMADol, zolpidem  Physical Exam: Physical Exam  Constitutional: He is oriented to  person, place, and time. He appears well-developed.  HENT:  Head: Normocephalic.  Cardiovascular: Normal rate.   Pulmonary/Chest: Effort normal.  Cough  Neurological: He is alert and oriented to person, place, and time.                Vital Signs: BP 129/73 mmHg  Pulse 79  Temp(Src) 96.6 F (35.9 C) (Oral)  Resp 32  Ht _0  (1.778 m)  Wt 82.192 kg (181 lb 3.2 oz)  BMI 26.00 kg/m2  SpO2 100% SpO2: SpO2: 100 % O2 Device: O2 Device: NRB O2 Flow Rate: O2 Flow Rate (L/min): 15 L/min  Intake/output summary:   Intake/Output Summary (Last 24 hours) at 09/18/15 1920 Last data filed at 09/18/15 1730  Gross per 24 hour  Intake    120 ml  Output    700 ml  Net   -580 ml   LBM: Last BM Date: 09/14/15 Baseline Weight: Weight: 83.915 kg (185 lb) (pt reported) Most recent weight: Weight: 82.192 kg (181 lb 3.2 oz)       Palliative Assessment/Data: Flowsheet Rows        Most Recent Value   Intake Tab    Referral Department  Critical care   Unit at Time of Referral  ICU   Palliative Care Primary Diagnosis  Cancer   Date Notified  09/10/15   Palliative Care Type  New Palliative care   Reason for referral  Clarify Goals of Care   Date of Admission  09/26/2015   # of days IP prior to Palliative referral  4   Clinical Assessment    Psychosocial & Spiritual Assessment    Palliative Care Outcomes       Additional Data Reviewed: CBC    Component Value Date/Time   WBC 11.3* 09/15/2015 0257   WBC 10.3  06/25/2015 0941   RBC 3.90* 09/15/2015 0257   RBC 4.54 06/25/2015 0941   HGB 10.2* 09/15/2015 0257   HGB 11.3* 06/25/2015 0941   HCT 34.7* 09/15/2015 0257   HCT 36.1* 06/25/2015 0941   PLT 146* 09/15/2015 0257   PLT 181 06/25/2015 0941   MCV 89.0 09/15/2015 0257   MCV 79.7 06/25/2015 0941   MCH 26.2 09/15/2015 0257   MCH 24.9* 06/25/2015 0941   MCHC 29.4* 09/15/2015 0257   MCHC 31.3* 06/25/2015 0941   RDW 17.6* 09/15/2015 0257   RDW 18.7* 06/25/2015 0941   LYMPHSABS 0.4* 09/15/2015 0257   LYMPHSABS 0.5* 06/25/2015 0941   MONOABS 0.1 09/15/2015 0257   MONOABS 0.6 06/25/2015 0941   EOSABS 0.0 09/15/2015 0257   EOSABS 0.1 06/25/2015 0941   BASOSABS 0.0 09/15/2015 0257   BASOSABS 0.0 06/25/2015 0941    CMP     Component Value Date/Time   NA 141 09/15/2015 0257   NA 139 06/25/2015 0941   K 4.4 09/15/2015 0257   K 4.0 06/25/2015 0941   CL 101 09/15/2015 0257   CO2 34* 09/15/2015 0257   CO2 29 06/25/2015 0941   GLUCOSE 133* 09/15/2015 0257   GLUCOSE 96 06/25/2015 0941   BUN 34* 09/15/2015 0257   BUN 28.7* 06/25/2015 0941   CREATININE 0.83 09/15/2015 0257   CREATININE 1.2 06/25/2015 0941   CALCIUM 9.0 09/15/2015 0257   CALCIUM 9.4 06/25/2015 0941   PROT 6.4 06/25/2015 0941   PROT 6.5 05/15/2015 1700   ALBUMIN 2.4* 09/15/2015 0257   ALBUMIN 3.1* 06/25/2015 0941   AST 12 06/25/2015 0941   AST 22 05/15/2015 1700   ALT  17 06/25/2015 0941   ALT 10* 05/15/2015 1700   ALKPHOS 71 06/25/2015 0941   ALKPHOS 92 05/15/2015 1700   BILITOT 0.32 06/25/2015 0941   BILITOT 0.5 05/15/2015 1700   GFRNONAA >60 09/15/2015 0257   GFRAA >60 09/15/2015 0257       Problem List:  Patient Active Problem List   Diagnosis Date Noted  . Acute on chronic respiratory failure with hypoxia ([place])   . Metabolic alkalosis   . Pneumothorax, left   . Hemoptysis   . HCAP (healthcare-associated pneumonia)   . Tobacco abuse   . Palliative care encounter   . Non-small cell lung cancer (NSCLC)  ([place])   . Pneumothorax on left   . COPD exacerbation ([place])   . Acute delirium   . Pleuritic chest pain 09/14/2015  . Chronic respiratory failure with hypoxia ([place]) 09/18/2015  . COPD (chronic obstructive pulmonary disease) ([place])   . History of pneumothorax   . Radiation pneumonitis ([place])   . Pleural effusion 05/31/2015  . CAD (coronary artery disease), native coronary artery 12/03/2014  . Thoracic ascending aortic aneurysm ([place]) 12/03/2014  . Current use of long term anticoagulation   . SCC (squamous cell carcinoma of lung) ([place])   . History of tobacco abuse 11/25/2014  . Chronic atrial fibrillation [facility])      Palliative Care Assessment & Plan    1.Code Status:  DNR    Code Status Orders        Start     Ordered   10/01/2015 1242  Do not attempt resuscitation (DNR)   Continuous    Question Answer Comment  In the event of cardiac or respiratory ARREST Do not call a "code blue"   In the event of cardiac or respiratory ARREST Do not perform Intubation, CPR, defibrillation or ACLS   In the event of cardiac or respiratory ARREST Use medication by any route, position, wound care, and other measures to relive pain and suffering. May use oxygen, suction and manual treatment of airway obstruction as needed for comfort.      10/04/2015 1241       2. Goals of Care/Additional Recommendations:  Goal is to get him home with comfort and hospice and family is trying to coordinate this.   Limitations on Scope of Treatment: No Chemotherapy or radiation  Desire for further Chaplaincy support:no  Psycho-social Needs: Caregiving  Support/Resources  3. Symptom Management:  Constipation: Miralax ordered prn.   SOB: Per pulmonology. Continue oxygen.  Congestion: Continue robinul 0.2 mg IV every 4 hours.   Weakness: PT consulted.  Bilat swelling in feet: Improved.   4. Palliative Prophylaxis:   Bowel Regimen and Delirium Protocol  5. Prognosis: Poor. Likely weeks. < 6 months.    6. Discharge Planning:  Home with hospice if he can be supported at home.    Thank you for allowing the Palliative Medicine Team to assist in the care of this patient.   Time In: 1000 Time Out: 1110 Total Time 75mn Prolonged Time Billed  yes        APershing Proud NP  138/[id number]/7342 7:20 PM  Please contact Palliative Medicine Team phone at [phone] questions and concerns.

## 2015-09-18 NOTE — Progress Notes (Signed)
   09/18/15 1400  Clinical Encounter Type  Visited With Family  Visit Type Spiritual support  Referral From Palliative care team  Spiritual Encounters  Spiritual Needs Emotional;Grief support  Stress Factors  Family Stress Factors Loss  In response to briefing at Palliative rounds, chaplain visited patient. He was sleeping but was able to chat with daughter Rod Holler at length. She was looking forward to getting him home with his dog but had not yet found appropriate caregivers. Hopes he will go home Thursday or Friday. Daughter discussed other losses in family.

## 2015-09-18 NOTE — Progress Notes (Signed)
Pt is barely responsive to voice, does not speak, cannot drink through a straw or eat apple sauce. Pt appears to be declining rapidly. RN will continue to monitor.

## 2015-09-18 NOTE — Care Management Note (Signed)
Case Management Note  Patient Details  Name: ZAMIR STAPLES MRN: [id number] Date of Birth: [birth year]  Subjective/Objective:      Adm w ch pain             Action/Plan: lives w fam, pcp dr Levin Bacon  Expected Discharge Date:                  Expected Discharge Plan:  Home w Hospice Care  In-House Referral:  Clinical Social Work  Discharge planning Services  CM Consult  Post Acute Care Choice:    Choice offered to:     DME Arranged:    DME Agency:     HH Arranged:  RN, Social Work [employer] Agency:  [facility] [place] of Service:  Completed, signed off  Medicare Important Message Given:  Yes Date Medicare IM Given:    Medicare IM give by:    Date Additional Medicare IM Given:    Additional Medicare Important Message give by:     If discussed at [place] of Stay Meetings, dates discussed:    Additional Comments:fam met w paliative care. They request hhc ref w hsopice and paliatvie care of [place], poss [facility]. sw consult will be placed in case needed. stacie w hospic and pal care [place] to meet w fam today.  Lacretia Leigh, RN 09/18/2015, 11:01 AM

## 2015-09-18 NOTE — Progress Notes (Signed)
[place] TEAM 1 - Stepdown/ICU TEAM Progress Note  [name] MBE:[id number] DOB: 1933-07-31 DOA: 09/14/2015 PCP: [name], MD  Admit HPI / Brief Narrative: 81 year old WM PMHx  Chronic Hypoxemic Respiratory Failure 2dary COPD on 3 L oxygen, Stage IIIA  NSCLC completed Chemotherapy and XRT May 2016, CAD native artery, Chronic Atrial Fibrillation, Thoracic Ascending AA, and a recent admission for spontaneous pneumothorax on 11/5   Presented to the ER with abrupt onset of left sided anterior pleuritic-type chest pain. Echocardiogram noted only a small pericardial effusion.   After admission the patient was noted to have increased work of breathing and rapid response team was called. Patient complained of shortness of breath and also coughing up blood. CT thorax was done which showed 15% pneumothorax on the left.   HPI/Subjective: 12/13 A/O 4, patient is COMPETENT. Patient states he would like to go home with palliative care/hospice and drink eggnog for Christmas. Requested no when he will be transferred to his new room today.  Assessment/Plan: Small left pneumothorax Care per PCCM as follows: "repeat cxr 09/13/15 and if stable opd fu. If recurs needs VATS pleurodesis" - no PTX was seen on f/u CXR 12/8 or 12/10 - this issue appears to have resolved   COPD exacerbation The patient is on maximum dose medical therapy and has not responded - at this time it appears that anxiety is still interfering with his respiratory mechanics - increase anxiolytic again today and follow results   Acute on chronic hypoxic respiratory failure -baseline oxygen requirement 3 L/m at home - Over the weekend patient's respiratory status has declined. Patient now alternating between nasal cannula, Ventimask, and nonrebreather.   Possible right lower lobe healthcare associated pneumonia -Completed a course of abx tx 12/9 - no sx to suggest worsening infection   Metabolic alkalosis -Most likely  secondary to pneumonia. Continue pneumonia treatment  Coagulopathy w/ Hemoptysis -secondary to heparin & Xarelto - continuing to hold systemic anticoagulation given the patient's hemoptysis & acute pneumothorax.  Acute delirium -Resolved - likely secondary to hypercapnia in the setting of COPD exacerbation  Atrial fibrillation -Cardiology following - holding systemic anticoagulation given hemoptysis - HR controlled   Acute renal failure -Resolved   Stage IIIa non-small cell lung cancer -Status post treatment - initially diagnosed February 2016 - followed by Dr. Julien Nordmann -Patient will be discharged on [place] in next 24-48 hours  Anemia - no signs of active bleeding  Thrombocytopenia -Mild; stable   History of hyperthyroidism -TSH normal on 12/1  History of coronary artery disease -Followed by Cardiology; negative CP  Tobacco abuse -continuing nicotine patch   Goals of care -Had long discussion with patient on what his wishes were concerning his current diagnosis and treatment plan. Patient states he knows he will have to go at some point. Would like to spend the remainder of his days at home over Christmas holiday drinking eggnog. -Home Hospice in next 24-48 hours. Palliative care met with family today and is arranging required equipment and care    Code Status: DO NOT RESUSCITATE Family Communication: family present at time of exam Disposition Plan:Home Hospice in next 24-48 hours     Consultants: [place]  Procedure/Significant Events: 12/01- Admit to Hospital 12/02 New Left Ptx - CT Chest: Small anterior left pneumothorax. Changes in right lung consistent with prior radiation pneumonitis. 12/2 echocardiogram; - Left ventricle: moderate LVH. -LVEF= 55%- 60%.  - Left atrium: severely dilated.- Right atrium:  severely dilated. - Pericardium, extracardiac:  effusion appears to me more prominent on the free wall and apex;also present   Posteriorly. Small to moderate pericardial effusion was identified circumferential to the heart. No hemodynamic compromise. 12/03 CT Chest: Slight reduction in size of small left anterior pneumothorax. Mild consolidation RLL 12/03 - PCCM consulted & transfered to ICU 12/04 Port CXR: No obvious pneumothorax. RLL opacity persists. 12/05 Port CXR: No obvious pneumothorax. RLL opacity unchanged. 12/06 Transfer to SDU.  12/8 PCXR;Stable right suprahilar lesion./chronic interstitial lung disease and pleural parenchymal scarring, particularly on the right. 12/8 bilateral lower extremity Doppler; negative DVT/SVT/Baker's cyst .  Culture   Antibiotics: levaquin 12/7 >  DVT prophylaxis: SCD   Devices NA   LINES / TUBES:  NA    Continuous Infusions:   Objective: VITAL SIGNS: Temp: 97.4 F (36.3 C) (12/13 0742) Temp Source: Axillary (12/13 0742) BP: 140/82 mmHg (12/13 0742) Pulse Rate: 84 (12/13 0742) SPO2; FIO2:   Intake/Output Summary (Last 24 hours) at 09/18/15 0852 Last data filed at 09/17/15 0901  Gross per 24 hour  Intake    480 ml  Output      0 ml  Net    480 ml     Exam: General: A/O 4, acute on chronic respiratory distress.  Eyes: Negative headache, eye pain, double vision,negative scleral hemorrhage ENT: Negative Runny nose, negative ear pain, negative gingival bleeding, Neck:  Negative scars, masses, torticollis, lymphadenopathy, JVD Lungs: right lung fields rhonchorous, decreased breath sounds left lung fields, bronchial sputum sounds.  Cardiovascular: Tachycardic, Regular rhythm without murmur gallop or rub normal S1 and S2 Abdomen:negative abdominal pain, nondistended, positive soft, bowel sounds, no rebound, no ascites, no appreciable mass Extremities: No significant cyanosis, clubbing, or edema bilateral lower extremities Psychiatric:  Negative depression, negative anxiety, negative fatigue, negative mania Neurologic:  Cranial nerves II through  XII intact, tongue/uvula midline, all extremities muscle strength 5/5, sensation intact throughout, negative dysarthria, negative expressive aphasia, negative receptive aphasia.   Data Reviewed: Basic Metabolic Panel:  Recent Labs Lab 09/12/15 0249 09/13/15 0416 09/14/15 0256 09/15/15 0257  NA 140 140 139 141  K 3.5 3.7 3.9 4.4  CL 98* 97* 98* 101  CO2 33* 36* 35* 34*  GLUCOSE 146* 131* 110* 133*  BUN 41* 31* 31* 34*  CREATININE 1.11 0.85 0.80 0.83  CALCIUM 8.8* 9.1 9.1 9.0  MG 2.0 2.4 2.7*  --   PHOS 2.6 2.7 2.7 3.6   Liver Function Tests:  Recent Labs Lab 09/12/15 0249 09/13/15 0416 09/14/15 0256 09/15/15 0257  ALBUMIN 2.3* 2.6* 2.5* 2.4*   No results for input(s): LIPASE, AMYLASE in the last 168 hours. No results for input(s): AMMONIA in the last 168 hours. CBC:  Recent Labs Lab 09/12/15 0249 09/13/15 0416 09/14/15 0256 09/15/15 0257  WBC 8.7 9.6 10.6* 11.3*  NEUTROABS 8.3* 8.8* 9.6* 10.8*  HGB 9.5* 9.9* 10.2* 10.2*  HCT 30.8* 33.6* 33.8* 34.7*  MCV 87.7 88.2 88.5 89.0  PLT 120* 132* 132* 146*   Cardiac Enzymes: No results for input(s): CKTOTAL, CKMB, CKMBINDEX, TROPONINI in the last 168 hours. BNP (last 3 results)  Recent Labs  05/16/15 0458 09/15/2015 0940 09/08/15 1352  BNP 268.1* 135.7* 157.9*    ProBNP (last 3 results)  Recent Labs  05/31/15 1705  PROBNP 91.0    CBG: No results for input(s): GLUCAP in the last 168 hours.  Recent Results (from the past 240 hour(s))  MRSA PCR Screening     Status: None   Collection Time: 09/08/15  2:59  PM  Result Value Ref Range Status   MRSA by PCR NEGATIVE NEGATIVE Final    Comment:        The GeneXpert MRSA Assay (FDA approved for NASAL specimens only), is one component of a comprehensive MRSA colonization surveillance program. It is not intended to diagnose MRSA infection nor to guide or monitor treatment for MRSA infections.   Culture, expectorated sputum-assessment     Status: None    Collection Time: 09/13/15 11:00 AM  Result Value Ref Range Status   Specimen Description SPUTUM  Final   Special Requests Normal  Final   Sputum evaluation   Final    THIS SPECIMEN IS ACCEPTABLE. RESPIRATORY CULTURE REPORT TO FOLLOW.   Report Status 09/13/2015 FINAL  Final  Culture, respiratory (NON-Expectorated)     Status: None   Collection Time: 09/13/15 11:00 AM  Result Value Ref Range Status   Specimen Description SPUTUM  Final   Special Requests NONE  Final   Gram Stain   Final    FEW WBC PRESENT, PREDOMINANTLY PMN RARE SQUAMOUS EPITHELIAL CELLS PRESENT RARE YEAST RARE GRAM POSITIVE COCCI IN PAIRS    Culture   Final    FEW YEAST CONSISTENT WITH CANDIDA SPECIES Performed at [facility]    Report Status 09/16/2015 FINAL  Final     Studies:  Recent x-ray studies have been reviewed in detail by the Attending Physician  Scheduled Meds:  Scheduled Meds: . budesonide-formoterol  2 puff Inhalation BID  . clonazePAM  1 mg Oral TID  . dextromethorphan-guaiFENesin  2 tablet Oral BID  . digoxin  0.125 mg Oral Daily  . diltiazem  180 mg Oral Q12H  . glycopyrrolate  0.2 mg Intravenous 6 times per day  . ipratropium-albuterol  3 mL Nebulization QID  . lactose free nutrition  237 mL Oral TID BM  . methylPREDNISolone (SOLU-MEDROL) injection  60 mg Intravenous Q24H  . pantoprazole  40 mg Oral QHS  . tiotropium  18 mcg Inhalation Daily  . triamcinolone cream   Topical TID    Time spent on care of this patient: 40 mins   WOODS, Geraldo Docker , MD  [facility] Office  [phone] Pager - [phone]  On-Call/Text Page:      [email or web address]      password [email or web address]  If 7PM-7AM, please contact night-coverage [email or web address] Password [email or web address] 09/18/2015, 8:52 AM   LOS: 12 days   Care during the described time interval was provided by me .  I have reviewed this patient's available data, including medical history, events of note, physical examination, and all test results as  part of my evaluation. I have personally reviewed and interpreted all radiology studies.   Dia Crawford, MD [phone] Pager

## 2015-09-19 MED ORDER — ACETAMINOPHEN 325 MG PO TABS: 650.0000 mg | ORAL_TABLET | Freq: Four times a day (QID) | ORAL | Status: DC | PRN

## 2015-09-19 MED ORDER — BIOTENE DRY MOUTH MT LIQD: 15.0000 mL | OROMUCOSAL | Status: DC | PRN

## 2015-09-19 MED ORDER — MORPHINE BOLUS VIA INFUSION
2.0000 mg | INTRAVENOUS | Status: DC | PRN
  Filled 2015-09-19: qty 2

## 2015-09-19 MED ORDER — LORAZEPAM 2 MG/ML IJ SOLN
1.0000 mg | INTRAMUSCULAR | Status: DC | PRN
Start: 2015-09-19 — End: 2015-09-19

## 2015-09-19 MED ORDER — MORPHINE SULFATE (PF) 2 MG/ML IV SOLN
INTRAVENOUS | Status: AC
  Filled 2015-09-19: qty 1

## 2015-09-19 MED ORDER — ACETAMINOPHEN 650 MG RE SUPP: 650.0000 mg | Freq: Four times a day (QID) | RECTAL | Status: DC | PRN

## 2015-09-19 MED ORDER — SODIUM CHLORIDE 0.9 % IV SOLN
2.0000 mg/h | INTRAVENOUS | Status: DC
  Administered 2015-09-19: 2 mg/h via INTRAVENOUS
  Filled 2015-09-19: qty 10

## 2015-09-19 MED ORDER — POLYVINYL ALCOHOL 1.4 % OP SOLN
1.0000 [drp] | Freq: Four times a day (QID) | OPHTHALMIC | Status: DC | PRN
  Filled 2015-09-19: qty 15

## 2015-09-19 MED ORDER — MORPHINE SULFATE (PF) 2 MG/ML IV SOLN
2.0000 mg | INTRAVENOUS | Status: AC | PRN
  Administered 2015-09-19: 2 mg via INTRAVENOUS
  Filled 2015-09-19: qty 1

## 2015-09-19 MED ORDER — MORPHINE SULFATE (PF) 2 MG/ML IV SOLN
2.0000 mg | Freq: Once | INTRAVENOUS | Status: AC
  Administered 2015-09-19: 2 mg via INTRAVENOUS

## 2015-09-25 ENCOUNTER — Ambulatory Visit: Payer: Medicare Other | Admitting: Internal Medicine

## 2015-10-07 NOTE — Discharge Summary (Signed)
Death Summary  [name]:[id number] DOB: 04/29/1933 DOA: 09-09-15  PCP: [name], MD  Admit date: 09-09-15 Date of Death: 2015-09-22  Final Diagnoses:  Left pneumothorax COPD exacerbation Acute on chronic hypoxic respiratory failure R leg weakness Coagulopathy w/ Hemoptysis Possible right lower lobe healthcare associated pneumonia Acute delirium Atrial fibrillation Acute renal failure Stage IIIa non-small cell lung cancer Anemia Thrombocytopenia History of hyperthyroidism History of coronary artery disease Nicotine dependence  History of present illness:  82 year old male patient known chronic hypoxemic respiratory failure secondary to COPD on 3 L oxygen, non-small cell lung cancer who completed chemotherapy and radiation in May 2016, and a recent admission for spontaneous pneumothorax on 11/5 who presented to the ER with abrupt onset of left sided anterior pleuritic-type chest pain. Echocardiogram noted only a small pericardial effusion.   After admission the patient was noted to have increased work of breathing and rapid response team was called. Patient complained of shortness of breath and also coughing up blood. CT thorax was done which showed 15% pneumothorax on the left.   Significant Events: 09/09/2023 Admit to [facility] 12/02 New Left Ptx - CT Chest: Small anterior left pneumothorax. Changes in right lung consistent with prior radiation pneumonitis. 12/03 CT Chest: Slight reduction in size of small left anterior pneumothorax. Mild consolidation RLL 12/03 PCCM consulted & transfered to ICU 12/04 Port CXR: No obvious pneumothorax. RLL opacity persists. 12/05 Port CXR: No obvious pneumothorax. RLL opacity unchanged. 12/06 Transfer to SDU 09-22-2023 [name] died   Hospital Course:  [name] is a pleasant 82 year old gentleman admitted under the circumstances noted above.  He underwent a protracted hospital stay perpetuated by the very serious  multifaceted nature of his advanced lung disease.  Despite the initial resolution of his minimal left pneumothorax, he continued to experience severe pulmonary disease with refractory hypoxic respiratory failure throughout his entire hospital stay.  This was felt to be driven by a pneumonia, his known non-small cell lung cancer, and refractory end-stage COPD.  Maximal medical therapy was provided for an extended period of time and unfortunately the patient simply was not able to improve.  After multiple visits and prolonged discussions with palliative care the family decided to focus primarily on comfort.  The patient himself, and the final 24-36 hours of his hospital stay, had become unconscious.  On 09-22-2015 at approximately 10:45 AM the patient died in the hospital under comfort care.  SignedJoette Catching T  [facility] 09/22/15, 6:35 PM

## 2015-10-07 NOTE — Progress Notes (Signed)
PT Cancellation Note  Patient Details Name: VLADISLAV AXELSON MRN: [id number] DOB: September 08, 1933   Cancelled Treatment:    Reason Eval/Treat Not Completed: Medical issues which prohibited therapy (Pt with declined status.  Will sign off.  Nurse agrees. )   Dema Severin, [place] 10/07/2015, 10:04 AM [facility] [phone] (pager)

## 2015-10-07 NOTE — Progress Notes (Signed)
RT lowered patients FIO2 to 60% via partial rebreather. RT will titrate down if needed per pallative care. RT will continue to monitor.

## 2015-10-07 NOTE — Progress Notes (Signed)
Pt has remained unresponsive since this morning. At 1045, pt has no respirations, no pulse, pupils are fixed and dilated, and heart rhythm showed asystole on the monitor. Family currently at bedside. Two RNs, Colletta [place] and I listened to pt's heart and there is no heart sound present.  MD and palliative care NP notified.

## 2015-10-07 DEATH — deceased

## 2015-11-07 NOTE — Progress Notes (Signed)
Daily Progress Note   Patient Name: [name]       Date: 10/19/2015 DOB: Jun 29, 1933  Age: 82 y.o. MRN#: [id number] Attending Physician: No att. providers found Primary Care Physician: [name], MD Admit Date: 09/21/2015  Reason for Consultation/Follow-up: Establishing goals of care  Subjective: [name] has made a turn overnight and great decline. He is actively dying and family is at bedside. Breathing is more labored and agonal. They have all agreed on comfort care and will initiate morphine drip. Provided listening and emotional support. Family was very nervous about taking him home with hospice and are not ready to "let him go" but somewhat relieved they are not providing his EOL care at home themselves. All questions/concerns addressed. Discussed what to expect. Also passed concerns to AD of unit from day/night before.    Length of Stay: 13 days  Current Medications: Scheduled Meds:    Continuous Infusions: morphine   Physical Exam: Physical Exam  Constitutional: He is oriented to person, place, and time. He appears well-developed.  HENT:  Head: Normocephalic.  Cardiovascular: Normal rate.   Pulmonary/Chest: Effort normal.  Cough  Neurological: He is alert and oriented to person, place, and time.                Vital Signs: BP 36/25 mmHg  Pulse 33  Temp(Src) 97.3 F (36.3 C) (Axillary)  Resp 23  Ht '5\' 10"'$  (1.778 m)  Wt 82.2 kg (181 lb 3.5 oz)  BMI 26.00 kg/m2  SpO2 77% SpO2: SpO2: (!) 77 % O2 Device: O2 Device: NRB O2 Flow Rate: O2 Flow Rate (L/min): 15 L/min  Intake/output summary:  No intake or output data in the 24 hours ending 10/19/15 1405 LBM: Last BM Date: 09/14/15 Baseline Weight: Weight: 83.915 kg (185 lb) (pt reported) Most recent  weight: Weight: 82.2 kg (181 lb 3.5 oz)       Palliative Assessment/Data: Flowsheet Rows        Most Recent Value   Intake Tab    Referral Department  Critical care   Unit at Time of Referral  ICU   Palliative Care Primary Diagnosis  Cancer   Date Notified  09/10/15   Palliative Care Type  New Palliative care   Reason for referral  Clarify Goals of Care   Date  of Admission  09/23/2015   Date first seen by Palliative Care  09/11/15   # of days Palliative referral response time  1 Day(s)   # of days IP prior to Palliative referral  4   Clinical Assessment    Psychosocial & Spiritual Assessment    Palliative Care Outcomes    Actual Discharge Date  2015/10/01 [death]      Additional Data Reviewed: CBC    Component Value Date/Time   WBC 11.3* 09/15/2015 0257   WBC 10.3 06/25/2015 0941   RBC 3.90* 09/15/2015 0257   RBC 4.54 06/25/2015 0941   HGB 10.2* 09/15/2015 0257   HGB 11.3* 06/25/2015 0941   HCT 34.7* 09/15/2015 0257   HCT 36.1* 06/25/2015 0941   PLT 146* 09/15/2015 0257   PLT 181 06/25/2015 0941   MCV 89.0 09/15/2015 0257   MCV 79.7 06/25/2015 0941   MCH 26.2 09/15/2015 0257   MCH 24.9* 06/25/2015 0941   MCHC 29.4* 09/15/2015 0257   MCHC 31.3* 06/25/2015 0941   RDW 17.6* 09/15/2015 0257   RDW 18.7* 06/25/2015 0941   LYMPHSABS 0.4* 09/15/2015 0257   LYMPHSABS 0.5* 06/25/2015 0941   MONOABS 0.1 09/15/2015 0257   MONOABS 0.6 06/25/2015 0941   EOSABS 0.0 09/15/2015 0257   EOSABS 0.1 06/25/2015 0941   BASOSABS 0.0 09/15/2015 0257   BASOSABS 0.0 06/25/2015 0941    CMP     Component Value Date/Time   NA 141 09/15/2015 0257   NA 139 06/25/2015 0941   K 4.4 09/15/2015 0257   K 4.0 06/25/2015 0941   CL 101 09/15/2015 0257   CO2 34* 09/15/2015 0257   CO2 29 06/25/2015 0941   GLUCOSE 133* 09/15/2015 0257   GLUCOSE 96 06/25/2015 0941   BUN 34* 09/15/2015 0257   BUN 28.7* 06/25/2015 0941   CREATININE 0.83 09/15/2015 0257   CREATININE 1.2 06/25/2015 0941   CALCIUM  9.0 09/15/2015 0257   CALCIUM 9.4 06/25/2015 0941   PROT 6.4 06/25/2015 0941   PROT 6.5 05/15/2015 1700   ALBUMIN 2.4* 09/15/2015 0257   ALBUMIN 3.1* 06/25/2015 0941   AST 12 06/25/2015 0941   AST 22 05/15/2015 1700   ALT 17 06/25/2015 0941   ALT 10* 05/15/2015 1700   ALKPHOS 71 06/25/2015 0941   ALKPHOS 92 05/15/2015 1700   BILITOT 0.32 06/25/2015 0941   BILITOT 0.5 05/15/2015 1700   GFRNONAA >60 09/15/2015 0257   GFRAA >60 09/15/2015 0257       Problem List:  Patient Active Problem List   Diagnosis Date Noted  . CAD in native artery   . Pneumothorax   . Acute on chronic respiratory failure with hypoxia ([place])   . Metabolic alkalosis   . Pneumothorax, left   . Hemoptysis   . HCAP (healthcare-associated pneumonia)   . Tobacco abuse   . Palliative care encounter   . Non-small cell lung cancer (NSCLC) ([place])   . Pneumothorax on left   . COPD exacerbation ([place])   . Acute delirium   . Pleuritic chest pain 10/04/2015  . Chronic respiratory failure with hypoxia ([place]) 09/23/2015  . COPD (chronic obstructive pulmonary disease) ([place])   . History of pneumothorax   . Radiation pneumonitis ([place])   . Pleural effusion 05/31/2015  . CAD (coronary artery disease), native coronary artery 12/03/2014  . Thoracic ascending aortic aneurysm ([place]) 12/03/2014  . Current use of long term anticoagulation   . SCC (squamous cell carcinoma of lung) ([place])   . History of  tobacco abuse 11/25/2014  . Chronic atrial fibrillation [facility])      Palliative Care Assessment & Plan    1.Code Status:  DNR    Code Status Orders        Start     Ordered   09/07/2015 1242  Do not attempt resuscitation (DNR)   Continuous    Question Answer Comment  In the event of cardiac or respiratory ARREST Do not call a "code blue"   In the event of cardiac or respiratory ARREST Do not perform Intubation, CPR, defibrillation or ACLS   In the event of cardiac or respiratory ARREST Use medication by any route,  position, wound care, and other measures to relive pain and suffering. May use oxygen, suction and manual treatment of airway obstruction as needed for comfort.      10/01/2015 1241       2. Goals of Care/Additional Recommendations:  Goal is to get him home with comfort and hospice and family is trying to coordinate this.   Limitations on Scope of Treatment: No Chemotherapy or radiation  Desire for further Chaplaincy support:no  Psycho-social Needs: Caregiving  Support/Resources  3. Symptom Management:  SOB: Will initiate low dose morphine infusion @ 2 mg/hr for SOB at EOL.   Congestion: Continue robinul 0.2 mg IV every 4 hours.   4. Palliative Prophylaxis:   Bowel Regimen and Delirium Protocol  5. Prognosis: Hours.   6. Discharge Planning:  Hospital death.    Thank you for allowing the Palliative Medicine Team to assist in the care of this patient.   Time In: 0830 Time Out: 0900 Total Time 63mn Prolonged Time Billed  no        APershing Proud NP  14/46/2863 2:05 PM  Please contact Palliative Medicine Team phone at [phone] questions and concerns.

## 2016-02-08 IMAGING — CR DG CHEST 1V PORT
2 series · 2 of 2 positions shown · non-contrast
Comparison: 11/26/2014, 11/25/2014

CLINICAL DATA: Subsequent evaluation right upper lobe mass and
right upper lobe consolidation

EXAM:
PORTABLE CHEST - 1 VIEW

[AP (1 of 2)]
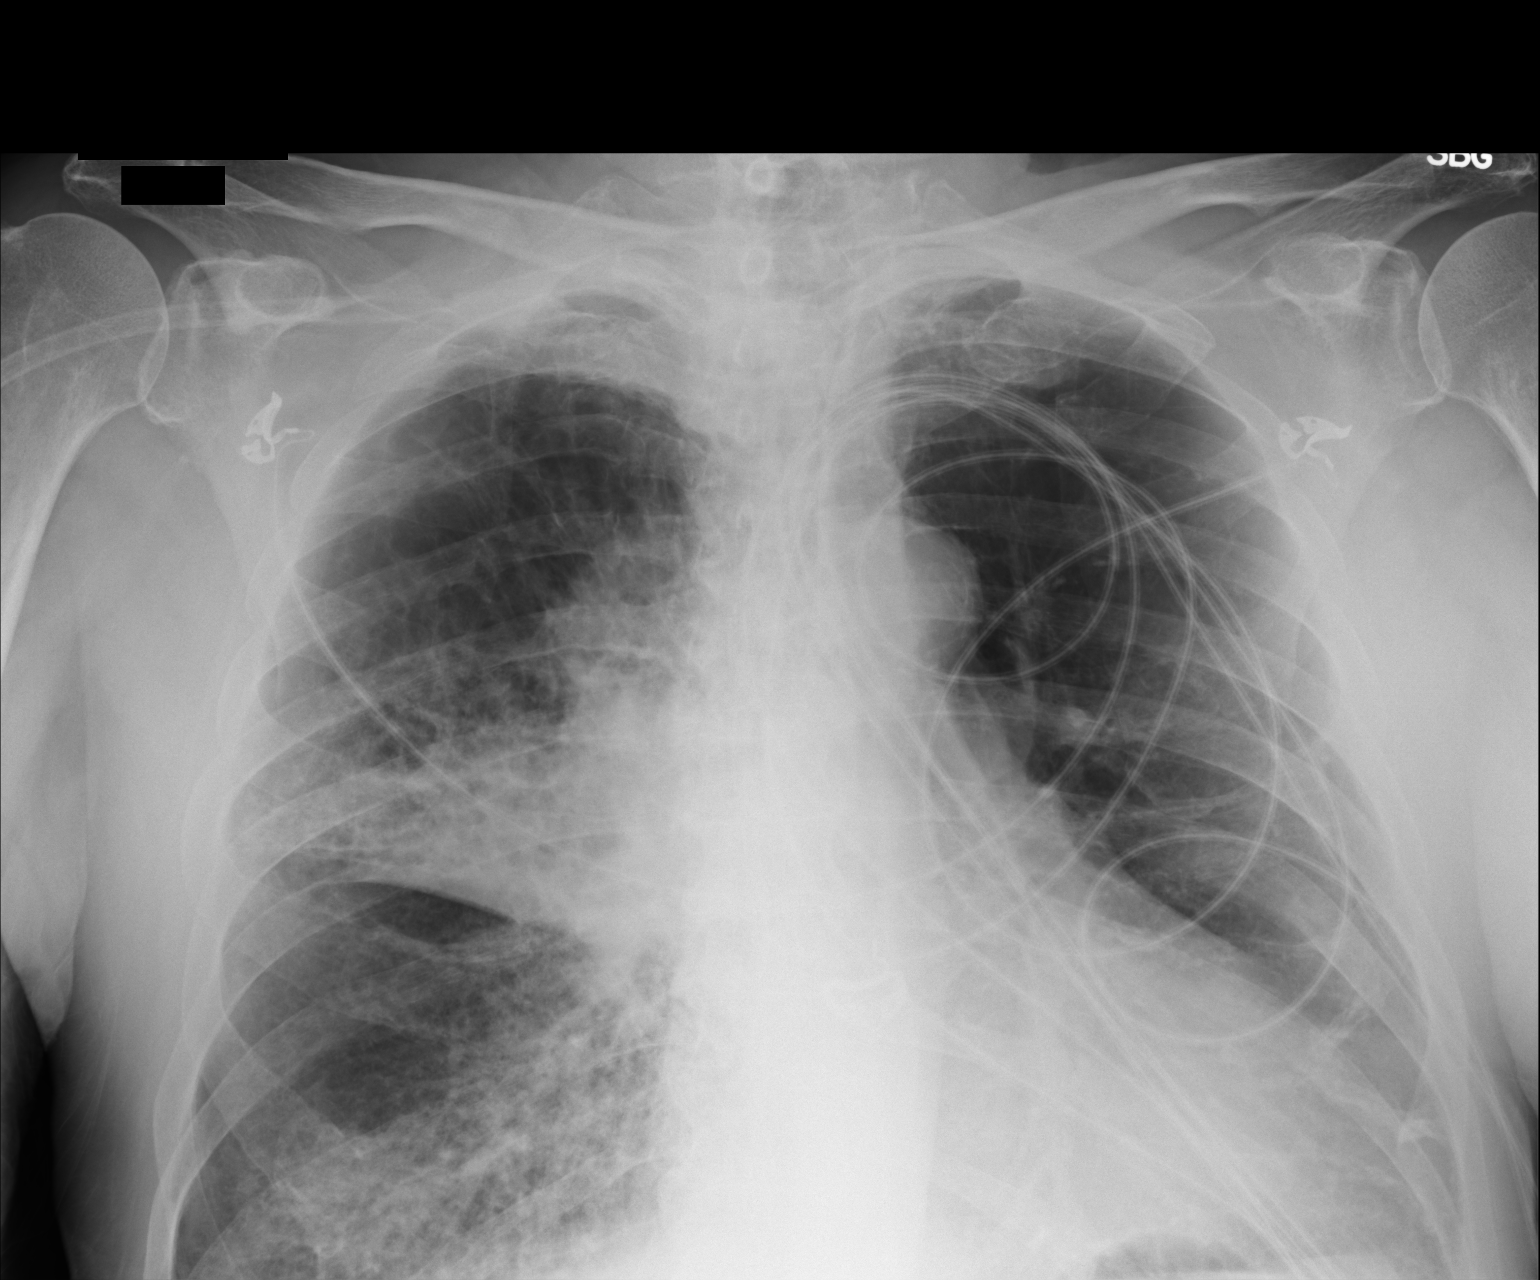

[AP (2 of 2)]
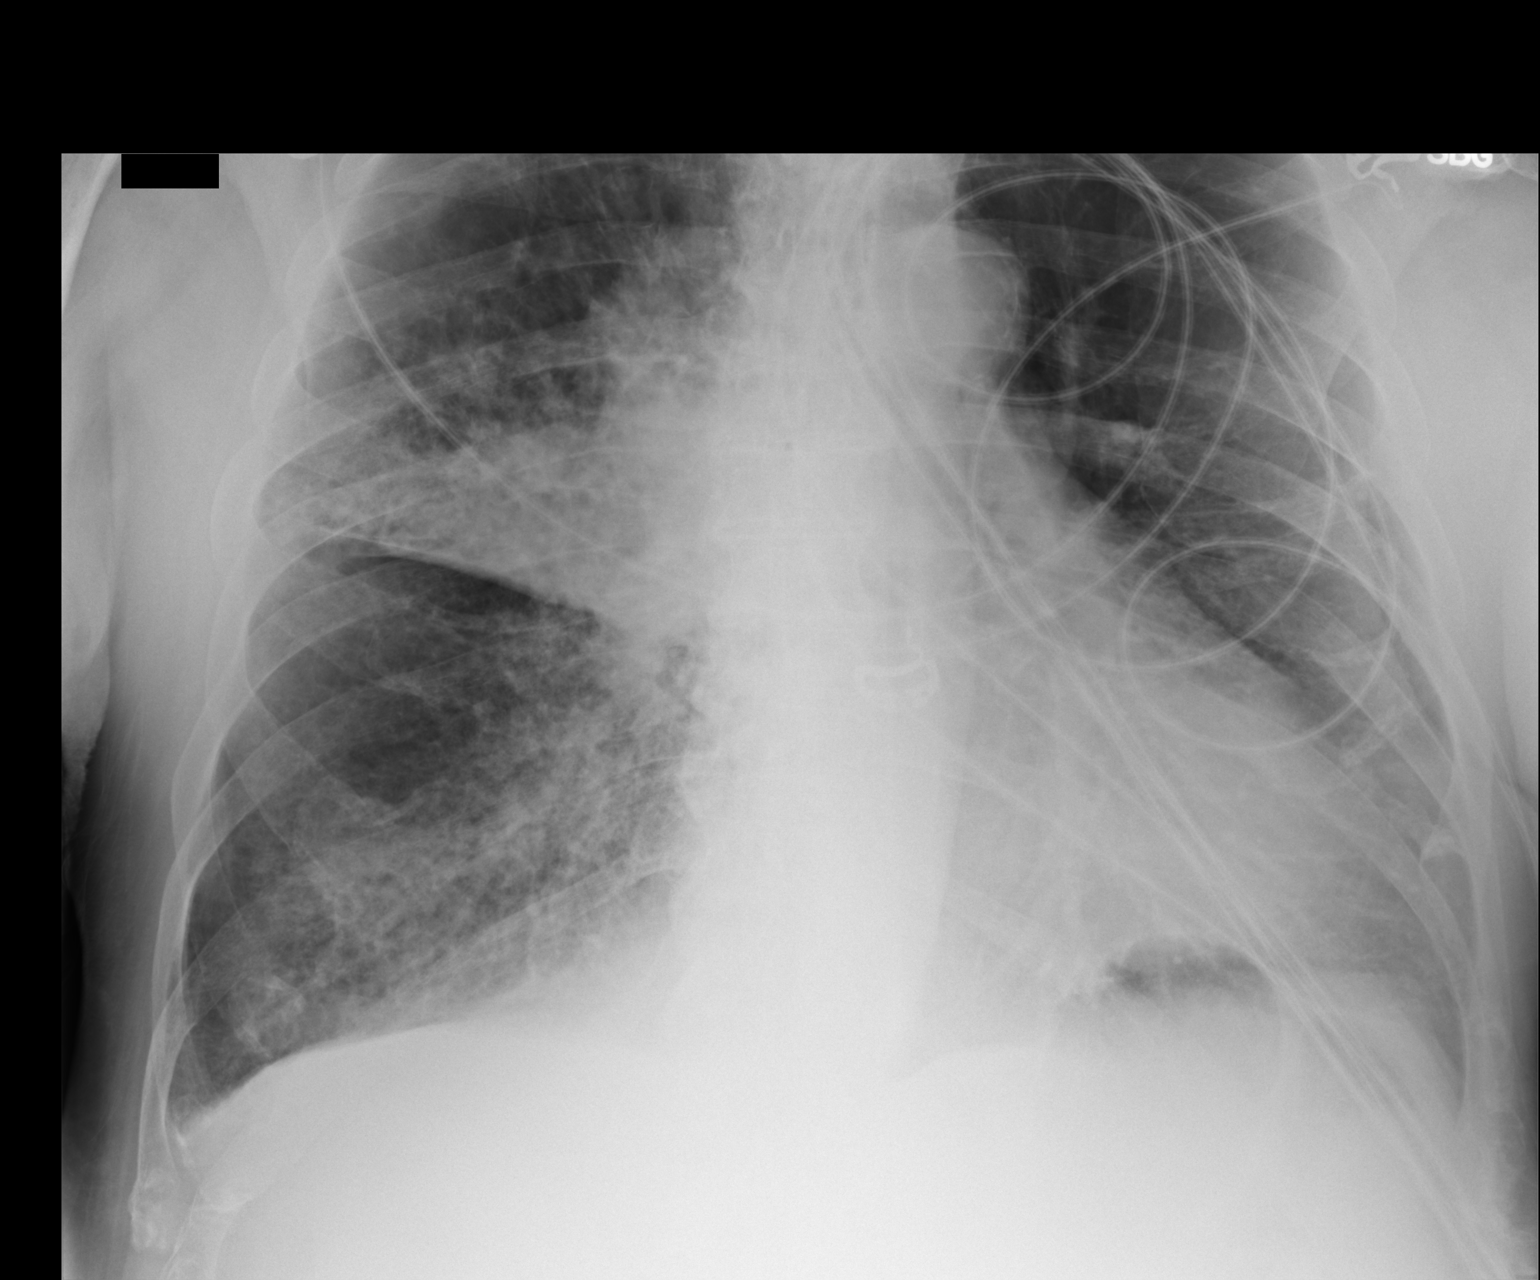

[2 of 2 positions shown; findings below may reference images not displayed]

FINDINGS: Stable cardiac enlargement. Stable consolidation right upper lobe
with right hilar fullness. Stable extensive right middle lobe
infiltrate. Left lung appears clear.
IMPRESSION: No change from prior chest radiograph with known right lung mass,
right upper lobe consolidation, and right middle lobe infiltrate.

## 2016-11-21 IMAGING — CR DG CHEST 1V PORT
1 series · 1 of 1 positions shown · non-contrast
Comparison: Chest CT dated 09/08/2015, chest CT dated 09/07/2015
and chest x-ray dated 09/06/2015.

CLINICAL DATA: Pneumohemothorax. History of COPD. Chronic
respiratory failure. Stage III carcinoma of the lung. Radiation
pneumonitis.

Left pneumothorax described on previous CT.
EXAM:
PORTABLE CHEST 1 VIEW

[AP]
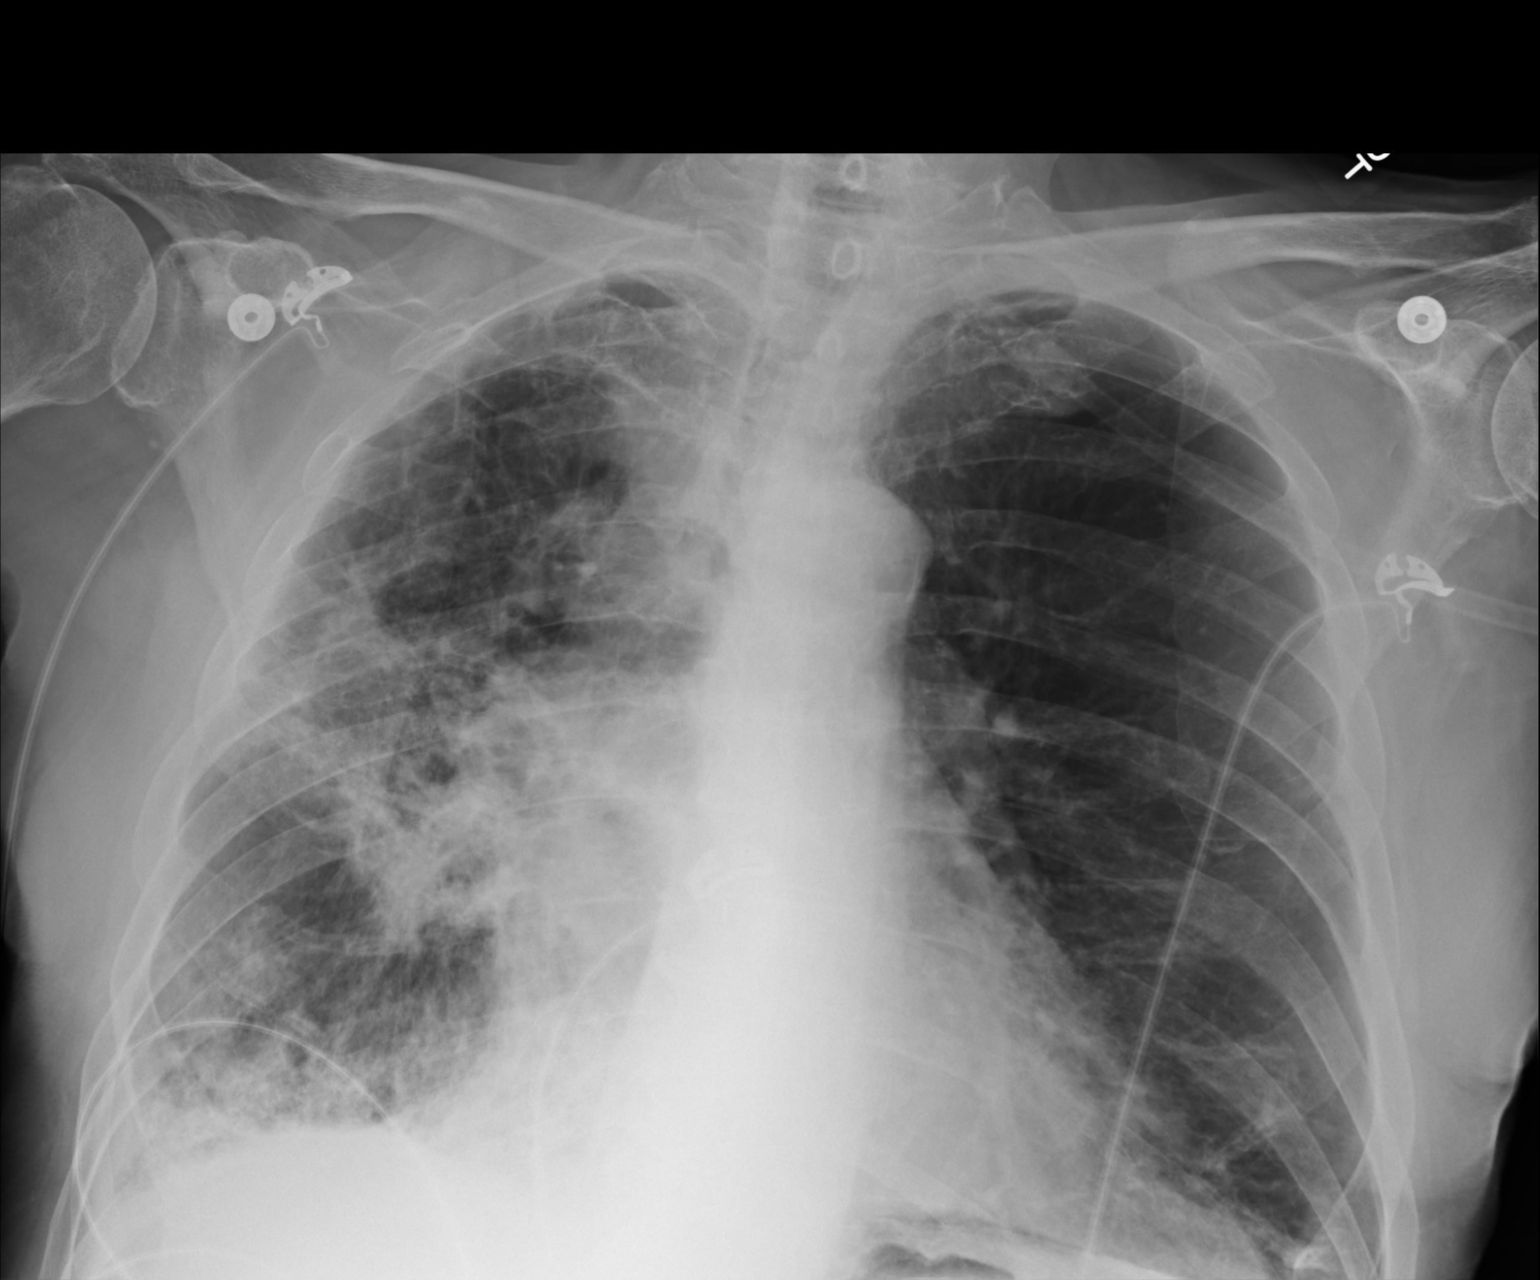

[1 of 1 positions shown; findings below may reference images not displayed]

FINDINGS: The previous chest CT showed a pneumothorax at the left lung base.
Left lung base is excluded on this chest x-ray. Suspect some degree
of persistent pneumothorax overlying the medial aspects of the left
hemidiaphragm, partially imaged.

The airspace opacities within the right lung appear essentially
unchanged compared to the recent chest CTs, increased compared to
the earlier chest x-ray, compatible with pneumonia superimposed on
chronic underlying fibrosis and emphysema. Left upper lobe remains
relatively clear.

Cardiomediastinal silhouette is stable in size and configuration.
Osseous structures are unremarkable.
IMPRESSION: 1. Previous chest CTs showed a a small pneumothorax at the left lung
base, estimated at 10-15%. This area is excluded on today's chest
x-ray. Recommend repeat chest x-ray with complete coverage of the
left lung base.
2. Persistent airspace opacities within the right lung, unchanged
compared the recent chest CTs, increased in the right mid lung
region compared to previous chest x-rays, compatible with pneumonia
superimposed on chronic fibrosis and emphysema.
These results will be called to the ordering clinician or
representative by the Radiologist Assistant, and communication
documented in the PACS or zVision Dashboard.

## 2016-11-22 IMAGING — DX DG CHEST 1V PORT
1 series · 1 of 1 positions shown · non-contrast
Comparison: 09/09/2015

CLINICAL DATA: Left-sided pneumothorax.

EXAM:
PORTABLE CHEST 1 VIEW

[chest ap]
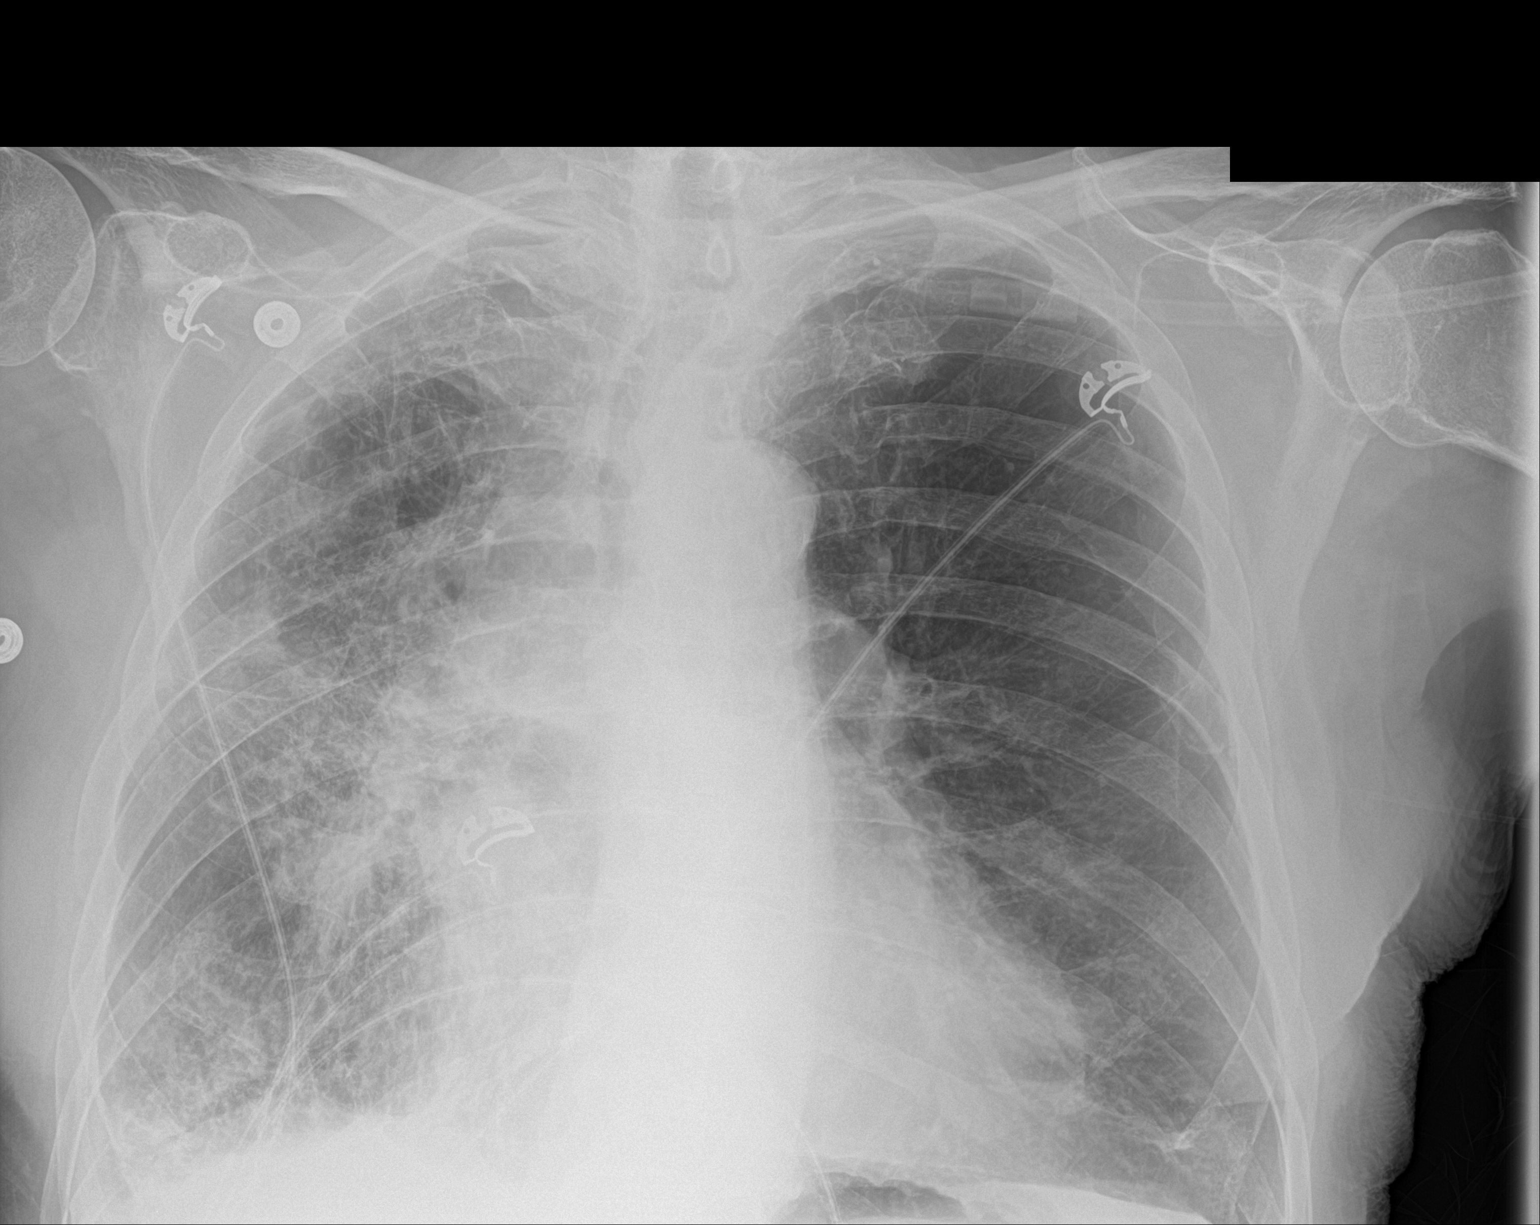

[1 of 1 positions shown; findings below may reference images not displayed]

FINDINGS: Mild cardiac enlargement. Aortic atherosclerosis is identified.
There is extensive fibrosis, scarring and volume loss involving the
right lung. When compared with 09/09/2015 the appearance is
unchanged. Left lung is relatively clear. No pneumothorax is
visualized.
IMPRESSION: 1. No change in the appearance of the right lung compared with
09/09/2015.
2. Left pneumothorax not visualized at this time.

## 2016-11-25 IMAGING — CR DG CHEST 1V PORT
2 series · 2 of 2 positions shown · non-contrast
Comparison: 09/10/2015.  09/06/2015.  CT 09/07/2015.

CLINICAL DATA: Pneumothorax.

EXAM:
PORTABLE CHEST 1 VIEW

[AP (1 of 2)]
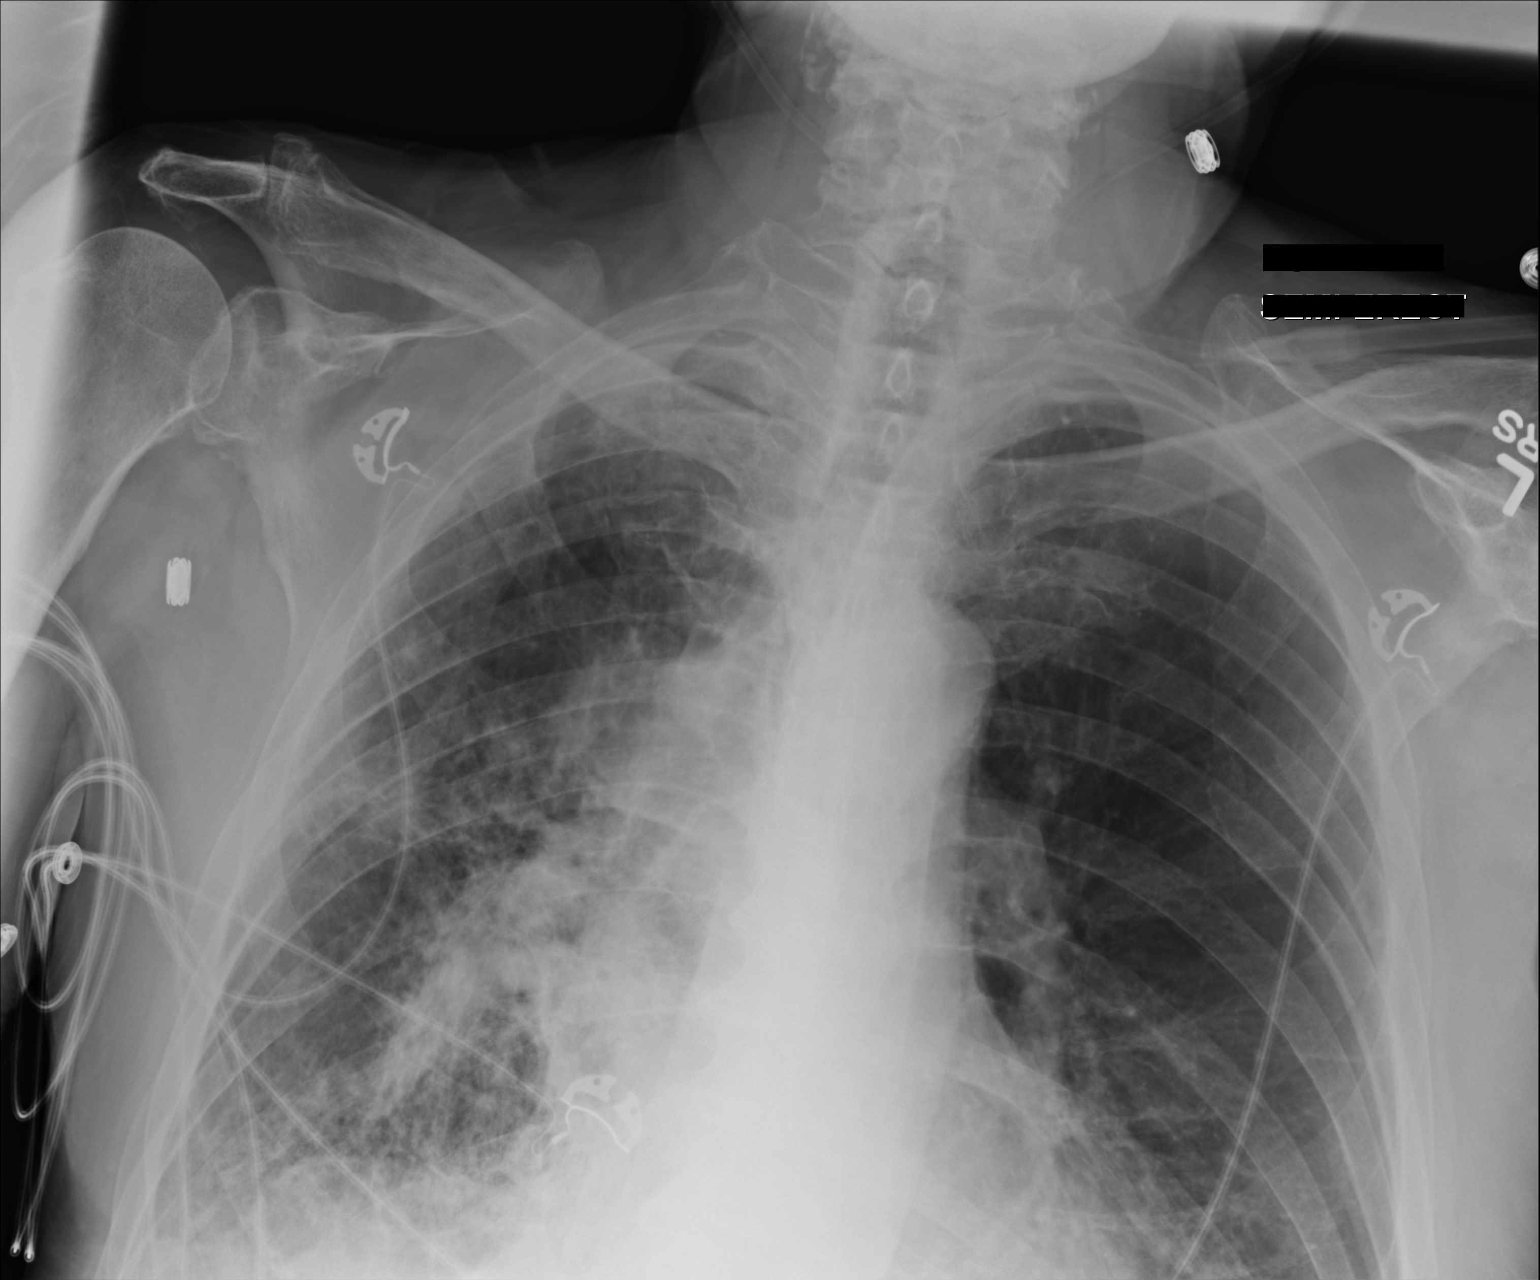

[AP (2 of 2)]
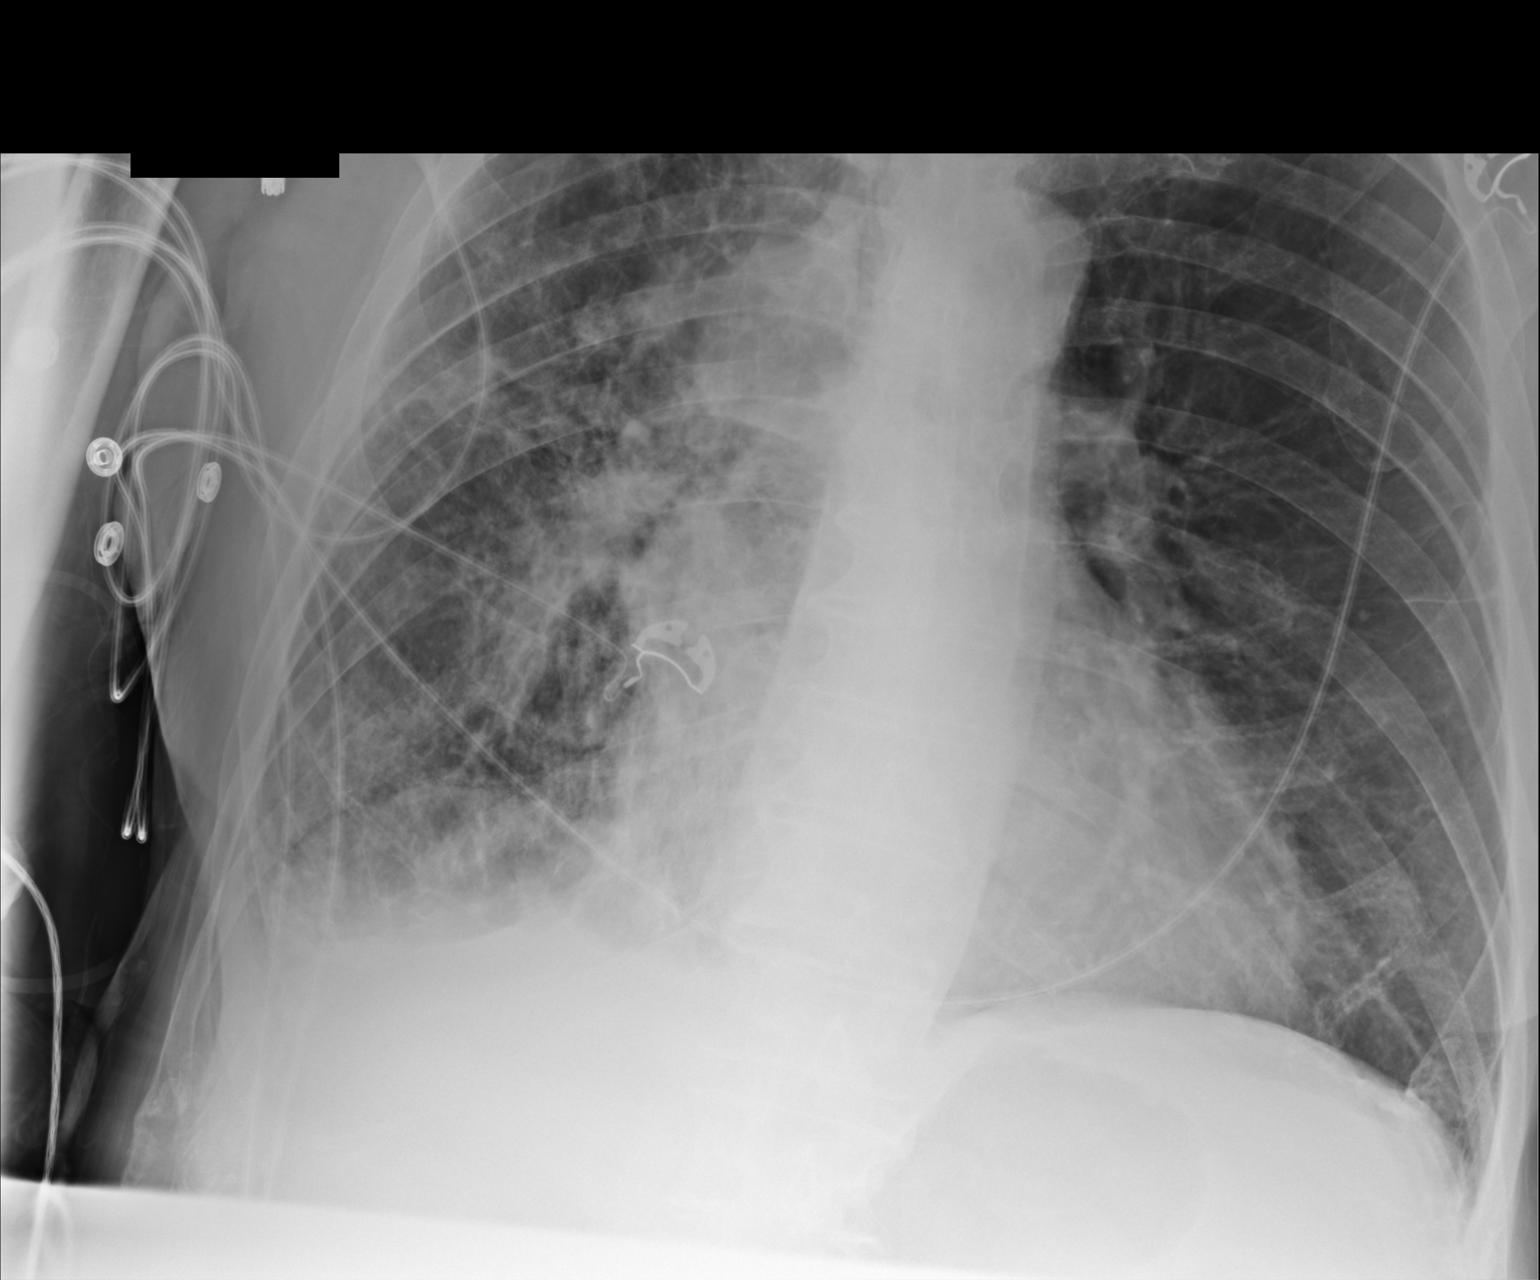

[2 of 2 positions shown; findings below may reference images not displayed]

FINDINGS: Mediastinum and hilar structures are stable. Stable right suprahilar
density. Stable chronic interstitial changes, particular on the
right. Stable changes of pleural parenchymal scarring. No
pneumothorax. Stable cardiomegaly.
IMPRESSION: 1.  Stable right suprahilar lesion.

2. Stable chronic interstitial lung disease and pleural parenchymal
scarring, particularly on the right.

3.  Stable cardiomegaly.
